# Patient Record
Sex: Female | Born: 1935
Health system: Southern US, Community
[De-identification: ages and names within clinical notes are randomized; demographics above are authoritative.]

## PROBLEM LIST (undated history)

## (undated) DIAGNOSIS — E785 Hyperlipidemia, unspecified: Secondary | ICD-10-CM

## (undated) DIAGNOSIS — M8580 Other specified disorders of bone density and structure, unspecified site: Secondary | ICD-10-CM

## (undated) DIAGNOSIS — G8929 Other chronic pain: Secondary | ICD-10-CM

## (undated) DIAGNOSIS — I639 Cerebral infarction, unspecified: Secondary | ICD-10-CM

## (undated) DIAGNOSIS — R55 Syncope and collapse: Secondary | ICD-10-CM

## (undated) DIAGNOSIS — F0150 Vascular dementia without behavioral disturbance: Secondary | ICD-10-CM

## (undated) DIAGNOSIS — I10 Essential (primary) hypertension: Secondary | ICD-10-CM

## (undated) DIAGNOSIS — K5790 Diverticulosis of intestine, part unspecified, without perforation or abscess without bleeding: Secondary | ICD-10-CM

## (undated) DIAGNOSIS — IMO0002 Reserved for concepts with insufficient information to code with codable children: Secondary | ICD-10-CM

## (undated) HISTORY — PX: DOPPLER ECHOCARDIOGRAPHY: SHX263

## (undated) HISTORY — DX: Cerebral infarction, unspecified: I63.9

## (undated) HISTORY — DX: Diverticulosis of intestine, part unspecified, without perforation or abscess without bleeding: K57.90

## (undated) HISTORY — DX: Essential (primary) hypertension: I10

## (undated) HISTORY — DX: Other specified disorders of bone density and structure, unspecified site: M85.80

## (undated) HISTORY — PX: ABDOMINAL HYSTERECTOMY: SHX81

## (undated) HISTORY — DX: Reserved for concepts with insufficient information to code with codable children: IMO0002

## (undated) HISTORY — PX: TONSILLECTOMY: SUR1361

## (undated) HISTORY — DX: Hyperlipidemia, unspecified: E78.5

## (undated) HISTORY — PX: OTHER SURGICAL HISTORY: SHX169

## (undated) HISTORY — PX: SPINE SURGERY: SHX786

## (undated) HISTORY — PX: EXPLORATORY LAPAROTOMY: SUR591

## (undated) HISTORY — DX: Syncope and collapse: R55

## (undated) HISTORY — DX: Other chronic pain: G89.29

---

## 1997-05-20 ENCOUNTER — Ambulatory Visit (HOSPITAL_COMMUNITY): Admission: RE | Admit: 1997-05-20 | Discharge: 1997-05-20 | Payer: Self-pay | Admitting: Family Medicine

## 1998-02-11 LAB — HM COLONOSCOPY: HM Colonoscopy: ABNORMAL

## 1998-05-09 ENCOUNTER — Other Ambulatory Visit: Admission: RE | Admit: 1998-05-09 | Discharge: 1998-05-09 | Payer: Self-pay | Admitting: Family Medicine

## 1998-06-21 ENCOUNTER — Ambulatory Visit (HOSPITAL_COMMUNITY): Admission: RE | Admit: 1998-06-21 | Discharge: 1998-06-21 | Payer: Self-pay | Admitting: Internal Medicine

## 1998-06-21 ENCOUNTER — Encounter: Payer: Self-pay | Admitting: Internal Medicine

## 1998-09-18 ENCOUNTER — Encounter (INDEPENDENT_AMBULATORY_CARE_PROVIDER_SITE_OTHER): Payer: Self-pay | Admitting: Specialist

## 1998-09-18 ENCOUNTER — Ambulatory Visit (HOSPITAL_COMMUNITY): Admission: RE | Admit: 1998-09-18 | Discharge: 1998-09-18 | Payer: Self-pay | Admitting: Obstetrics and Gynecology

## 1999-07-03 ENCOUNTER — Other Ambulatory Visit: Admission: RE | Admit: 1999-07-03 | Discharge: 1999-07-03 | Payer: Self-pay | Admitting: Family Medicine

## 2000-08-27 ENCOUNTER — Other Ambulatory Visit: Admission: RE | Admit: 2000-08-27 | Discharge: 2000-08-27 | Payer: Self-pay | Admitting: Family Medicine

## 2003-01-12 ENCOUNTER — Encounter (INDEPENDENT_AMBULATORY_CARE_PROVIDER_SITE_OTHER): Payer: Self-pay | Admitting: Internal Medicine

## 2003-01-25 ENCOUNTER — Other Ambulatory Visit: Admission: RE | Admit: 2003-01-25 | Discharge: 2003-01-25 | Payer: Self-pay | Admitting: Family Medicine

## 2003-02-02 ENCOUNTER — Encounter: Admission: RE | Admit: 2003-02-02 | Discharge: 2003-02-02 | Payer: Self-pay | Admitting: Family Medicine

## 2003-05-25 ENCOUNTER — Inpatient Hospital Stay (HOSPITAL_COMMUNITY): Admission: RE | Admit: 2003-05-25 | Discharge: 2003-05-29 | Payer: Self-pay | Admitting: Neurosurgery

## 2003-06-12 HISTORY — PX: LAMINECTOMY: SHX219

## 2004-09-17 ENCOUNTER — Ambulatory Visit: Payer: Self-pay | Admitting: Family Medicine

## 2004-12-12 ENCOUNTER — Ambulatory Visit: Payer: Self-pay | Admitting: Family Medicine

## 2005-03-06 ENCOUNTER — Ambulatory Visit: Payer: Self-pay | Admitting: Family Medicine

## 2005-11-11 ENCOUNTER — Ambulatory Visit: Payer: Self-pay | Admitting: Internal Medicine

## 2005-12-11 ENCOUNTER — Ambulatory Visit: Payer: Self-pay | Admitting: Family Medicine

## 2006-02-14 ENCOUNTER — Ambulatory Visit: Payer: Self-pay | Admitting: Family Medicine

## 2006-05-12 ENCOUNTER — Encounter (INDEPENDENT_AMBULATORY_CARE_PROVIDER_SITE_OTHER): Payer: Self-pay | Admitting: Internal Medicine

## 2006-05-12 DIAGNOSIS — I10 Essential (primary) hypertension: Secondary | ICD-10-CM | POA: Insufficient documentation

## 2006-05-12 DIAGNOSIS — E785 Hyperlipidemia, unspecified: Secondary | ICD-10-CM | POA: Insufficient documentation

## 2006-05-12 DIAGNOSIS — K5730 Diverticulosis of large intestine without perforation or abscess without bleeding: Secondary | ICD-10-CM | POA: Insufficient documentation

## 2006-05-12 DIAGNOSIS — M8580 Other specified disorders of bone density and structure, unspecified site: Secondary | ICD-10-CM | POA: Insufficient documentation

## 2006-09-18 ENCOUNTER — Telehealth: Payer: Self-pay | Admitting: Family Medicine

## 2006-12-16 ENCOUNTER — Ambulatory Visit: Payer: Self-pay | Admitting: Family Medicine

## 2006-12-16 DIAGNOSIS — M25559 Pain in unspecified hip: Secondary | ICD-10-CM | POA: Insufficient documentation

## 2007-01-02 ENCOUNTER — Encounter: Payer: Self-pay | Admitting: Family Medicine

## 2007-01-05 ENCOUNTER — Encounter: Admission: RE | Admit: 2007-01-05 | Discharge: 2007-01-05 | Payer: Self-pay | Admitting: Orthopaedic Surgery

## 2007-01-12 ENCOUNTER — Encounter (INDEPENDENT_AMBULATORY_CARE_PROVIDER_SITE_OTHER): Payer: Self-pay | Admitting: *Deleted

## 2007-02-03 ENCOUNTER — Telehealth: Payer: Self-pay | Admitting: Family Medicine

## 2007-02-24 ENCOUNTER — Encounter: Payer: Self-pay | Admitting: Family Medicine

## 2007-03-16 ENCOUNTER — Encounter: Payer: Self-pay | Admitting: Family Medicine

## 2007-03-25 ENCOUNTER — Encounter: Admission: RE | Admit: 2007-03-25 | Discharge: 2007-03-25 | Payer: Self-pay | Admitting: Orthopaedic Surgery

## 2007-04-10 ENCOUNTER — Inpatient Hospital Stay (HOSPITAL_COMMUNITY): Admission: RE | Admit: 2007-04-10 | Discharge: 2007-04-13 | Payer: Self-pay | Admitting: Orthopaedic Surgery

## 2007-05-08 ENCOUNTER — Encounter: Payer: Self-pay | Admitting: Family Medicine

## 2007-06-09 ENCOUNTER — Encounter: Payer: Self-pay | Admitting: Family Medicine

## 2007-08-18 ENCOUNTER — Encounter: Payer: Self-pay | Admitting: Family Medicine

## 2007-11-10 ENCOUNTER — Ambulatory Visit: Payer: Self-pay | Admitting: Family Medicine

## 2007-11-23 ENCOUNTER — Telehealth: Payer: Self-pay | Admitting: Family Medicine

## 2007-12-22 ENCOUNTER — Encounter: Payer: Self-pay | Admitting: Family Medicine

## 2007-12-23 ENCOUNTER — Encounter: Payer: Self-pay | Admitting: Family Medicine

## 2007-12-23 ENCOUNTER — Ambulatory Visit: Payer: Self-pay | Admitting: Family Medicine

## 2007-12-23 ENCOUNTER — Other Ambulatory Visit: Admission: RE | Admit: 2007-12-23 | Discharge: 2007-12-23 | Payer: Self-pay | Admitting: Family Medicine

## 2007-12-23 DIAGNOSIS — R109 Unspecified abdominal pain: Secondary | ICD-10-CM | POA: Insufficient documentation

## 2007-12-23 LAB — CONVERTED CEMR LAB
Ketones, urine, test strip: NEGATIVE
Nitrite: NEGATIVE
Protein, U semiquant: NEGATIVE
Urobilinogen, UA: 0.2

## 2007-12-23 LAB — HM PAP SMEAR

## 2007-12-24 ENCOUNTER — Encounter: Payer: Self-pay | Admitting: Family Medicine

## 2007-12-26 LAB — CONVERTED CEMR LAB
ALT: 29 units/L (ref 0–35)
Alkaline Phosphatase: 63 units/L (ref 39–117)
BUN: 17 mg/dL (ref 6–23)
Basophils Relative: 0.3 % (ref 0.0–3.0)
Bilirubin, Direct: 0.1 mg/dL (ref 0.0–0.3)
Creatinine, Ser: 0.9 mg/dL (ref 0.4–1.2)
Eosinophils Relative: 1.1 % (ref 0.0–5.0)
GFR calc Af Amer: 78 mL/min
HCT: 37.4 % (ref 36.0–46.0)
Hemoglobin: 12.9 g/dL (ref 12.0–15.0)
Monocytes Absolute: 0.7 10*3/uL (ref 0.1–1.0)
Monocytes Relative: 9.4 % (ref 3.0–12.0)
Phosphorus: 3.4 mg/dL (ref 2.3–4.6)
Platelets: 195 10*3/uL (ref 150–400)
Potassium: 4.1 meq/L (ref 3.5–5.1)
RBC: 3.76 M/uL — ABNORMAL LOW (ref 3.87–5.11)
Total CHOL/HDL Ratio: 3.7
Total Protein: 7.4 g/dL (ref 6.0–8.3)
Triglycerides: 143 mg/dL (ref 0–149)
WBC: 7.4 10*3/uL (ref 4.5–10.5)

## 2008-01-05 ENCOUNTER — Encounter: Payer: Self-pay | Admitting: Family Medicine

## 2008-02-08 ENCOUNTER — Ambulatory Visit: Payer: Self-pay | Admitting: Family Medicine

## 2008-02-08 DIAGNOSIS — R82998 Other abnormal findings in urine: Secondary | ICD-10-CM | POA: Insufficient documentation

## 2008-02-08 LAB — CONVERTED CEMR LAB
Bacteria, UA: 0
Epithelial cells, urine: 1 /lpf
Ketones, urine, test strip: NEGATIVE
Nitrite: NEGATIVE
RBC / HPF: 0
Urobilinogen, UA: 0.2

## 2008-02-13 ENCOUNTER — Ambulatory Visit: Payer: Self-pay | Admitting: Internal Medicine

## 2008-02-13 ENCOUNTER — Encounter: Payer: Self-pay | Admitting: Family Medicine

## 2008-02-13 ENCOUNTER — Inpatient Hospital Stay (HOSPITAL_COMMUNITY): Admission: EM | Admit: 2008-02-13 | Discharge: 2008-02-16 | Payer: Self-pay | Admitting: Emergency Medicine

## 2008-02-15 ENCOUNTER — Encounter: Payer: Self-pay | Admitting: Internal Medicine

## 2008-02-15 ENCOUNTER — Ambulatory Visit: Payer: Self-pay | Admitting: Vascular Surgery

## 2008-02-16 ENCOUNTER — Encounter: Payer: Self-pay | Admitting: Family Medicine

## 2008-02-16 ENCOUNTER — Ambulatory Visit: Payer: Self-pay | Admitting: Cardiology

## 2008-02-22 ENCOUNTER — Ambulatory Visit: Payer: Self-pay | Admitting: Family Medicine

## 2008-02-22 DIAGNOSIS — J069 Acute upper respiratory infection, unspecified: Secondary | ICD-10-CM | POA: Insufficient documentation

## 2008-02-26 ENCOUNTER — Ambulatory Visit: Payer: Self-pay | Admitting: Family Medicine

## 2008-02-26 DIAGNOSIS — I6350 Cerebral infarction due to unspecified occlusion or stenosis of unspecified cerebral artery: Secondary | ICD-10-CM | POA: Insufficient documentation

## 2008-02-27 ENCOUNTER — Encounter: Payer: Self-pay | Admitting: Family Medicine

## 2008-03-25 ENCOUNTER — Encounter: Payer: Self-pay | Admitting: Family Medicine

## 2008-09-14 ENCOUNTER — Telehealth: Payer: Self-pay | Admitting: Family Medicine

## 2008-11-30 ENCOUNTER — Ambulatory Visit: Payer: Self-pay | Admitting: Family Medicine

## 2009-01-18 ENCOUNTER — Encounter: Payer: Self-pay | Admitting: Family Medicine

## 2009-01-24 ENCOUNTER — Encounter (INDEPENDENT_AMBULATORY_CARE_PROVIDER_SITE_OTHER): Payer: Self-pay | Admitting: *Deleted

## 2009-11-07 ENCOUNTER — Ambulatory Visit: Payer: Self-pay | Admitting: Family Medicine

## 2009-11-09 LAB — CONVERTED CEMR LAB
AST: 23 units/L (ref 0–37)
Alkaline Phosphatase: 69 units/L (ref 39–117)
BUN: 18 mg/dL (ref 6–23)
Basophils Absolute: 0 10*3/uL (ref 0.0–0.1)
Bilirubin, Direct: 0 mg/dL (ref 0.0–0.3)
Chloride: 102 meq/L (ref 96–112)
GFR calc non Af Amer: 49.04 mL/min (ref >60)
Hemoglobin: 13 g/dL (ref 12.0–15.0)
Lymphocytes Relative: 38.9 % (ref 12.0–46.0)
Monocytes Relative: 9.8 % (ref 3.0–12.0)
Neutro Abs: 3.4 10*3/uL (ref 1.4–7.7)
Neutrophils Relative %: 48.6 % (ref 43.0–77.0)
Phosphorus: 3.2 mg/dL (ref 2.3–4.6)
Potassium: 4.2 meq/L (ref 3.5–5.1)
RBC: 3.91 M/uL (ref 3.87–5.11)
RDW: 13.9 % (ref 11.5–14.6)
TSH: 0.63 microintl units/mL (ref 0.35–5.50)
Total CHOL/HDL Ratio: 3
VLDL: 35.2 mg/dL (ref 0.0–40.0)

## 2010-01-26 ENCOUNTER — Encounter: Payer: Self-pay | Admitting: Family Medicine

## 2010-01-29 ENCOUNTER — Encounter: Payer: Self-pay | Admitting: Family Medicine

## 2010-01-29 LAB — HM MAMMOGRAPHY: HM Mammogram: NORMAL

## 2010-03-13 NOTE — Assessment & Plan Note (Signed)
Summary: FOR MED REFILL/RI   Vital Signs:  Patient profile:   77 year old female Weight:      168 pounds Temp:     98 degrees F oral Pulse rate:   92 / minute Pulse rhythm:   regular BP sitting:   112 / 80  (left arm) Cuff size:   regular  Vitals Entered By: Lewanda Rife LPN (November 07, 2009 3:10 PM) CC: med refill   History of Present Illness: here for f/u of lipid/ HTN / cva and bone loss  is not doing a lot  still a lot of chronic pain in her side   has "extra roll of fat" on L side  that side hurts a lot  has a bulging disk    wt is up 2 lb  has tried to take care of herself  diet is so so   bp is good 112/80-- good control of current pressure  due for lipids-- thisks it may be up due to diet  last LDL 130s  diet is not optimal- but she tries   on fosamax for osteopenia  ? last dexa 07-- pt declines it due to cost currently  is taking ca and vit D    flu shot - got that   Allergies: 1)  ! Celebrex (Celecoxib) 2)  Lodine  Past History:  Past Medical History: Last updated: 02/26/2008 Hyperlipidemia Hypertension Osteopenia Diverticulosis, colon deg disk dz past- chronic abd pain with neg expl lap  CVA  GI- [facility] cardiol- [facility] neurosx- Cyndia Skeeters   Past Surgical History: Last updated: 02/26/2008 Hysterectomy 1971, partial, abn pap neg exp lap in past for chronic R abd pain  Dexa 02/2001--osteopenia Dexa 02/2005--decreased BMD tonsilectomy 2000 colonosc- neg except for diverticulosis Exp. Lap--side pain,neg Laminectomy, L2 to L4  06/2003 L3- L4 microdiscectomy 2009 (1/10) hosp for L CVA (L post frontal and pariatal infarct) nl echo (1/10) with EF 60% nl carotid dopplers (1/10) MRA/MRI (1/10) with above cva plus 3 mm R ICA aneurysm  Family History: Last updated: 12/23/2007 father's side- HTN and back problems  mother- healthy  no cancer in family   Social History: Last updated: 12/23/2007 non smoker  retired    very rare alcohol   Risk Factors: Smoking Status: never (05/12/2006)  Review of Systems General:  Denies fatigue, fever, loss of appetite, and malaise. Eyes:  Denies blurring and eye irritation. CV:  Denies chest pain or discomfort, lightheadness, palpitations, and shortness of breath with exertion. Resp:  Denies cough and shortness of breath. GI:  Denies abdominal pain, bloody stools, change in bowel habits, indigestion, and nausea. GU:  Denies dysuria and urinary frequency. MS:  Complains of low back pain and mid back pain; denies joint pain, joint redness, and joint swelling. Derm:  Denies itching, lesion(s), poor wound healing, and rash. Neuro:  Denies numbness and tingling. Psych:  Denies anxiety and depression. Endo:  Denies excessive thirst and excessive urination. Heme:  Denies abnormal bruising and bleeding.  Physical Exam  General:  overweight but generally well appearing  Head:  normocephalic, atraumatic, and no abnormalities observed.  no sinus tenderness  Eyes:  vision grossly intact, pupils equal, pupils round, and pupils reactive to light.   Mouth:  pharynx pink and moist, no erythema, and no exudates.   Neck:  supple with full rom and no masses or thyromegally, no JVD or carotid bruit  Chest Wall:  No deformities, masses, or tenderness noted. Lungs:  Normal respiratory effort, chest  expands symmetrically. Lungs are clear to auscultation, no crackles or wheezes. Heart:  Normal rate and regular rhythm. S1 and S2 normal without gallop, murmur, click, rub or other extra sounds. Abdomen:  no suprapubic tenderness or fullness felt  no renal bruits  Msk:  some LS tenderness with pos slr on L  poor rom hips  Pulses:  R and L carotid,radial,femoral,dorsalis pedis and posterior tibial pulses are full and equal bilaterally Extremities:  No clubbing, cyanosis, edema, or deformity noted with normal full range of motion of all joints.   Neurologic:  sensation intact to light  touch and DTRs symmetrical and normal.   Skin:  Intact without suspicious lesions or rashes Cervical Nodes:  No lymphadenopathy noted Psych:  normal affect, talkative and pleasant    Impression & Recommendations:  Problem # 1:  CVA (ICD-434.91) Assessment Unchanged  no recurrance of symtpoms on asa  will continue to work on risk factors Her updated medication list for this problem includes:    Bayer Aspirin 325 Mg Tabs (Aspirin) .Marland Kitchen... Take one tablet once daily  Orders: Prescription Created Electronically [phone])  Problem # 2:  OSTEOPENIA (ICD-733.90) Assessment: Unchanged pt declines dexa this year due to cost  disc ca and D check D level continue fosamax Her updated medication list for this problem includes:    Fosamax 70 Mg Tabs (Alendronate sodium) .Marland Kitchen... Take one tablet by mouth once a  week as directed    Calcium Carbonate-vitamin D 600-400 Mg-unit Tabs (Calcium carbonate-vitamin d) .Marland Kitchen... Take 1 tablet by mouth once a day  Orders: Venipuncture (08657) TLB-Lipid Panel (80061-LIPID) TLB-Renal Function Panel (80069-RENAL) TLB-CBC Platelet - w/Differential (85025-CBCD) TLB-Hepatic/Liver Function Pnl (80076-HEPATIC) TLB-TSH (Thyroid Stimulating Hormone) (84443-TSH) T-Vitamin D (25-Hydroxy) (84696-29528) Specimen Handling (41324) Prescription Created Electronically [phone])  Problem # 3:  HIP PAIN (ICD-719.45) Assessment: Deteriorated  ongoing - switches sides with back pain and deg disc dz pt will f/u with ortho as this is affecting her more and more Her updated medication list for this problem includes:    Bayer Aspirin 325 Mg Tabs (Aspirin) .Marland Kitchen... Take one tablet once daily  Orders: Prescription Created Electronically [id number])  Problem # 4:  HYPERTENSION (ICD-401.9) Assessment: Unchanged  good control wtih current med no change in dose lab today Her updated medication list for this problem includes:    Triamterene-hctz 37.5-25 Mg Tabs (Triamterene-hctz) .Marland Kitchen...  1 by mouth once daily  Orders: Venipuncture (64403) TLB-Lipid Panel (80061-LIPID) TLB-Renal Function Panel (80069-RENAL) TLB-CBC Platelet - w/Differential (85025-CBCD) TLB-Hepatic/Liver Function Pnl (80076-HEPATIC) TLB-TSH (Thyroid Stimulating Hormone) (84443-TSH) T-Vitamin D (25-Hydroxy) (47425-95638) Prescription Created Electronically [phone])  BP today: 112/80 Prior BP: 110/80 (02/26/2008)  Labs Reviewed: K+: 4.1 (12/23/2007) Creat: : 0.9 (12/23/2007)   Chol: 240 (12/23/2007)   HDL: 65.6 (12/23/2007)   LDL: DEL (12/23/2007)   TG: 143 (12/23/2007)  Problem # 5:  HYPERLIPIDEMIA (ICD-272.4) Assessment: Unchanged  lipids today diet may not be opt disc low sat fat diet  Orders: Venipuncture (32951) TLB-Lipid Panel (80061-LIPID) TLB-Renal Function Panel (80069-RENAL) TLB-CBC Platelet - w/Differential (85025-CBCD) TLB-Hepatic/Liver Function Pnl (80076-HEPATIC) TLB-TSH (Thyroid Stimulating Hormone) (84443-TSH) T-Vitamin D (25-Hydroxy) (88416-60630) Prescription Created Electronically [phone])  Labs Reviewed: SGOT: 33 (12/23/2007)   SGPT: 29 (12/23/2007)   HDL:65.6 (12/23/2007)  LDL:DEL (12/23/2007)  Chol:240 (12/23/2007)  Trig:143 (12/23/2007)  Complete Medication List: 1)  Triamterene-hctz 37.5-25 Mg Tabs (Triamterene-hctz) .Marland Kitchen.. 1 by mouth once daily 2)  Fosamax 70 Mg Tabs (Alendronate sodium) .... Take one tablet by mouth once a  week as directed 3)  Bayer Aspirin 325 Mg Tabs (Aspirin) .... Take one tablet once daily 4)  Hydrocodone-homatropine 5-1.5 Mg/48ml Syrp (Hydrocodone-homatropine) .Marland Kitchen.. 1 tsp by mouth at bedtime as needed 5)  Fish Oil Oil (Fish oil) .... Take 1 capsule by mouth once a day 6)  Red Yeast Rice 600 Mg Caps (Red yeast rice extract) .... Take 1 capsule by mouth once a day 7)  Calcium Carbonate-vitamin D 600-400 Mg-unit Tabs (Calcium carbonate-vitamin d) .... Take 1 tablet by mouth once a day 8)  Vitamin B12 ?mg  .... Take 1 tablet by mouth once a  day  Other Orders: Flu Vaccine 6yrs + MEDICARE PATIENTS (A2130) Administration Flu vaccine - MCR (Q6578)  Patient Instructions: 1)  since you are having more back pain- I think you should follow up with your orthopedic doctor or neurosurgeon ( you can make your own appt) 2)  flu shot today  3)  lab today 4)  blood pressure is good  Prescriptions: FOSAMAX 70 MG  TABS (ALENDRONATE SODIUM) take one tablet by mouth once a  week as directed  #12 x 3   Entered and Authorized by:   Judith Part MD   Signed by:   Judith Part MD on 11/07/2009   Method used:   Electronically to        [facility]* (retail)       [address]       [place], [place]  [address]       Ph: [phone]       Fax: [phone]   RxID:   [id number] TRIAMTERENE-HCTZ 37.5-25 MG  TABS (TRIAMTERENE-HCTZ) 1 by mouth once daily  #90 x 3   Entered and Authorized by:   Judith Part MD   Signed by:   Judith Part MD on 11/07/2009   Method used:   Electronically to        [facility]* (retail)       [address]       [place], [place]  [address]       Ph: [phone]       Fax: [phone]   RxID:   [id number]   Current Allergies (reviewed today): ! CELEBREX (CELECOXIB) LODINE    Flu Vaccine Consent Questions     Do you have a history of severe allergic reactions to this vaccine? no    Any prior history of allergic reactions to egg and/or gelatin? no    Do you have a sensitivity to the preservative Thimersol? no    Do you have a past history of Guillan-Barre Syndrome? no    Do you currently have an acute febrile illness? no    Have you ever had a severe reaction to latex? no    Vaccine information given and explained to patient? yes    Are you currently pregnant? no    Lot Number:[id number]   Exp Date:08/11/2010   Site Given  Left Deltoid IMedflu  Lewanda Rife LPN  November 07, 2009 3:18 PM

## 2010-03-15 NOTE — Letter (Signed)
Summary: Results Follow up Letter  [place] [facility]  [address] [place], [place] [address]   Phone: [phone]  Fax: [phone]    01/29/2010 MRN: [id number]    [facility] [name] [address] [place], [place]  [address]    Dear Ms. Ortner,  The following are the results of your recent test(s):  Test         Result    Pap Smear:        Normal _____  Not Normal _____ Comments: ______________________________________________________ Cholesterol: LDL(Bad cholesterol):         Your goal is less than:         HDL (Good cholesterol):       Your goal is more than: Comments:  ______________________________________________________ Mammogram:        Normal ___X__  Not Normal _____ Comments:Repeat in one year.   ___________________________________________________________________ Hemoccult:        Normal _____  Not normal _______ Comments:    _____________________________________________________________________ Other Tests:    We routinely do not discuss normal results over the telephone.  If you desire a copy of the results, or you have any questions about this information we can discuss them at your next office visit.   Sincerely,    Idamae Schuller Tower,MD  MT/ri

## 2010-05-28 LAB — URINALYSIS, ROUTINE W REFLEX MICROSCOPIC
Bilirubin Urine: NEGATIVE
Glucose, UA: NEGATIVE mg/dL
Hgb urine dipstick: NEGATIVE
Ketones, ur: NEGATIVE mg/dL
Ketones, ur: NEGATIVE mg/dL
Nitrite: NEGATIVE
Nitrite: NEGATIVE
Protein, ur: NEGATIVE mg/dL
Specific Gravity, Urine: 1.008 (ref 1.005–1.030)
Specific Gravity, Urine: 1.023 (ref 1.005–1.030)
Urobilinogen, UA: 0.2 mg/dL (ref 0.0–1.0)
Urobilinogen, UA: 0.2 mg/dL (ref 0.0–1.0)
pH: 6.5 (ref 5.0–8.0)
pH: 6.5 (ref 5.0–8.0)

## 2010-05-28 LAB — CARDIAC PANEL(CRET KIN+CKTOT+MB+TROPI)
CK, MB: 1.6 ng/mL (ref 0.3–4.0)
Relative Index: INVALID (ref 0.0–2.5)
Total CK: 66 U/L (ref 7–177)
Total CK: 76 U/L (ref 7–177)
Troponin I: 0.01 ng/mL (ref 0.00–0.06)

## 2010-05-28 LAB — DIFFERENTIAL
Basophils Absolute: 0 10*3/uL (ref 0.0–0.1)
Basophils Absolute: 0 10*3/uL (ref 0.0–0.1)
Basophils Relative: 0 % (ref 0–1)
Basophils Relative: 0 % (ref 0–1)
Eosinophils Absolute: 0.1 10*3/uL (ref 0.0–0.7)
Eosinophils Relative: 1 % (ref 0–5)
Lymphocytes Relative: 25 % (ref 12–46)
Lymphs Abs: 2.4 K/uL (ref 0.7–4.0)
Monocytes Absolute: 0.9 10*3/uL (ref 0.1–1.0)
Monocytes Relative: 9 % (ref 3–12)
Neutro Abs: 4.8 10*3/uL (ref 1.7–7.7)
Neutro Abs: 6.1 K/uL (ref 1.7–7.7)
Neutrophils Relative %: 55 % (ref 43–77)
Neutrophils Relative %: 65 % (ref 43–77)

## 2010-05-28 LAB — LIPID PANEL
LDL Cholesterol: 137 mg/dL — ABNORMAL HIGH (ref 0–99)
Triglycerides: 124 mg/dL (ref <150)

## 2010-05-28 LAB — CBC
HCT: 36.8 % (ref 36.0–46.0)
Hemoglobin: 12.4 g/dL (ref 12.0–15.0)
Hemoglobin: 12.4 g/dL (ref 12.0–15.0)
MCHC: 33.8 g/dL (ref 30.0–36.0)
MCHC: 34 g/dL (ref 30.0–36.0)
MCV: 97 fL (ref 78.0–100.0)
MCV: 98.4 fL (ref 78.0–100.0)
Platelets: 243 10*3/uL (ref 150–400)
RBC: 3.73 MIL/uL — ABNORMAL LOW (ref 3.87–5.11)
RBC: 3.77 MIL/uL — ABNORMAL LOW (ref 3.87–5.11)
RDW: 13.1 % (ref 11.5–15.5)
RDW: 13.5 % (ref 11.5–15.5)
WBC: 9.4 K/uL (ref 4.0–10.5)

## 2010-05-28 LAB — URINE MICROSCOPIC-ADD ON

## 2010-05-28 LAB — URINE CULTURE: Culture: NO GROWTH

## 2010-05-28 LAB — COMPREHENSIVE METABOLIC PANEL
CO2: 29 mEq/L (ref 19–32)
Calcium: 10.4 mg/dL (ref 8.4–10.5)
Creatinine, Ser: 1.25 mg/dL — ABNORMAL HIGH (ref 0.4–1.2)
GFR calc non Af Amer: 42 mL/min — ABNORMAL LOW (ref >60)
Glucose, Bld: 93 mg/dL (ref 70–99)
Sodium: 141 mEq/L (ref 135–145)
Total Protein: 7.2 g/dL (ref 6.0–8.3)

## 2010-05-28 LAB — BASIC METABOLIC PANEL
BUN: 17 mg/dL (ref 6–23)
CO2: 27 mEq/L (ref 19–32)
Chloride: 104 mEq/L (ref 96–112)
Glucose, Bld: 101 mg/dL — ABNORMAL HIGH (ref 70–99)
Potassium: 3.8 mEq/L (ref 3.5–5.1)

## 2010-05-28 LAB — BASIC METABOLIC PANEL WITH GFR
Calcium: 10.4 mg/dL (ref 8.4–10.5)
Creatinine, Ser: 1.01 mg/dL (ref 0.4–1.2)
GFR calc Af Amer: >60 mL/min (ref >60)
GFR calc non Af Amer: 53 mL/min — ABNORMAL LOW (ref >60)
Sodium: 139 meq/L (ref 135–145)

## 2010-05-28 LAB — TROPONIN I: Troponin I: 0.01 ng/mL (ref 0.00–0.06)

## 2010-05-28 LAB — CK TOTAL AND CKMB (NOT AT ARMC)
CK, MB: 1.8 ng/mL (ref 0.3–4.0)
Relative Index: INVALID (ref 0.0–2.5)
Total CK: 69 U/L (ref 7–177)

## 2010-06-26 NOTE — H&P (Signed)
[name], [name]                ACCOUNT NO.:  [id number]   MEDICAL RECORD NO.:  [id number]          PATIENT TYPE:  EMS   LOCATION:  MAJO                         FACILITY:  [facility]   PHYSICIAN:  Gardiner Barefoot, MD    DATE OF BIRTH:  12/16/1930   DATE OF ADMISSION:  02/13/2008  DATE OF DISCHARGE:                              HISTORY & PHYSICAL   PRIMARY CARE PHYSICIAN:  Marne A. Tower, MD   CHIEF COMPLAINT:  Right hand dysfunction.   HISTORY OF PRESENT ILLNESS:  This is a 77 year old female with a history  of hypertension and hypercholesterolemia who noted an acute onset of  right hand weakness while vacuuming today.  She states that she could no  longer grasp the vacuum, and was unable to coil the wire around the  vacuum, hold the glass, or manipulate anything with her right hand.  She  states she has never had this before, and this had come on suddenly.  She reports it was sloppy feeling.  She denies any real known loss of  sensation, just an inability to move her right hand.  She denies any  headache.  No chest pain, shortness of breath, or any other associated  symptoms.   PAST MEDICAL HISTORY:  1. Hypertension.  2. Hyperlipidemia.  3. Chronic hip pain with disk narrowing.   MEDICATIONS:  1. Hydrochlorothiazide 12.5 mg daily.  2. Fish oil 1000 mg daily.  3. Calcium with vitamin D 500 mg b.i.d.   ALLERGIES:  MOBIC and CELEBREX.   SOCIAL HISTORY:  No history of alcohol, tobacco, or drugs.   FAMILY HISTORY:  Noncontributory.   REVIEW OF SYSTEMS:  Negative except as for the history of present  illness.   PHYSICAL EXAMINATION:  VITAL SIGNS:  Temperature 98.3, pulse 91,  respirations 18, blood pressure is 131/82, and O2 sats 97%.  GENERAL:  The patient is awake, alert, oriented x3, and appears in no  acute distress.  CARDIOVASCULAR:  Regular rate and rhythm with no murmurs, rubs, or  gallops.  LUNGS:  Clear to auscultation bilaterally.  ABDOMEN:  Soft, nontender, and  nondistended with positive bowel sounds.  No hepatosplenomegaly.  EXTREMITIES:  No cyanosis, clubbing, or edema.  NEUROLOGIC:  Intact cranial nerves, II through XII.  Her strength is  equal bilaterally, both upper and lower extremities including her right  hand, which are all 5/5.  She had normal sensation bilaterally on her  arms, hands, and face.   LABORATORY DATA:  A CT scan with no acute abnormality.  Sodium 139,  potassium 3.8, chloride 104, bicarb 27, glucose 101, BUN 17, creatinine  was 1.01, and calcium 10.4.  WBC 9.4, hemoglobin 12.4, platelets 243,  neutrophils 65%.  EKG with unchanged inverted T-waves, new first-degree  heart block.   ASSESSMENT AND PLAN:  Right hand dysfunction, which is transient and  resolved, concerning for a transient ischemic attack.  Also, new EKG  changes.  1. Transient ischemic attack.  We will have the patient evaluated for      stroke including MRI/MRA of the head, carotid Dopplers, and an  echocardiogram.  Will also hold her blood pressure medicine at this      time.  2. EKG changes.  Will send of serial enzymes to make sure this is not      a cardiac origin, though doubt this could be an atypical      presentation of myocardial infarction with no chest pain, shortness      of breath, or any other concerning symptoms.  3. Prophylaxis.  The patient will receive deep venous thrombosis      prophylaxis.      Gardiner Barefoot, MD  Electronically Signed     RWC/MEDQ  D:  02/13/2008  T:  02/14/2008  Job:  [id number]

## 2010-06-26 NOTE — Op Note (Signed)
[name], [name]                ACCOUNT NO.:  [id number]   MEDICAL RECORD NO.:  [id number]          PATIENT TYPE:  INP   LOCATION:  5018                         FACILITY:  [facility]   PHYSICIAN:  Mark C. Ophelia Charter, M.D.    DATE OF BIRTH:  30-Jun-1930   DATE OF PROCEDURE:  04/10/2007  DATE OF DISCHARGE:  04/13/2007                               OPERATIVE REPORT   PREOPERATIVE DIAGNOSIS:  Recurrent herniated nucleus pulposus, left L3-  L4.   POSTOPERATIVE DIAGNOSIS:  Recurrent herniated nucleus pulposus, left L3-  L4.   PROCEDURE:  1. Microdiskectomy, left L3-L4.  2. Repair of small dural tear with sutures and Tisseel.   SURGEON:  Mark C. Ophelia Charter, MD   ASSISTANT:  Wende Neighbors, PA-C   ANESTHESIA:  GOT.   FINDINGS:  Severe stenosis secondary to large fragmented HNP.   This 77 year old female has had previous decompression procedure from L2-  L3 down to L5 with an L2-L4 decompression laminectomy done by Dr. Phoebe Perch  back in 2005.  She did well until she had disk herniation at L3-L4 with  compression and radiculopathy failing conservative treatment.  She is  brought in for microdiskectomy due to the large disk herniation with  compression.  She had been treated conservatively with pain medications  and anti-inflammatories as well as epidural steroids without relief.   PROCEDURE IN DETAIL:  After induction of general anesthesia, the patient  was intubated and carefully positioned in prone position.  Back was  prepped with DuraPrep.  The area squared with towels.  Old incision was  noted and after Betadine-impregnated drape, preoperative Ancef, surgical  time-out procedure, cross-table lateral x-ray showed that the middle  marker was just at the inferior aspect of the L3 pedicle.  Incision was  made on the left side.  Subperiosteal dissection was performed.  There  was extensive scar tissue as expected following the lateral gutter.  Microdissection was used for release.  Small dural  tear occurred with  extremely thin dura.  Some sutures were placed and some Tisseel sealed  it and there was no leakage at 30 mm of pressure.  This was on the  lateral gutter.  On the left, once the nerve root was gently elevated,  large disk herniation was identified.  It was teased out, removed in  pieces, and this then allowed good decompression of the nerve root.  Passes were made through the disk and a hockey-stick was swept to enter  the dura in that area with compression.  This was a large fragment and  once decompression was performed, all bone had been removed at the level  of the pedicle.  Foramen and the nerve root  were freed.  Wound was irrigated.  Dural repair was checked again and  was watertight.  Fascia closed with 0 Vicryl, in the deep fascia 2-0  Vicryl, and then skin closure, postop dressing, extubation, and  transferred to recovery room where she was neurologically intact.      Mark C. Ophelia Charter, M.D.  Electronically Signed     MCY/MEDQ  D:  06/15/2007  T:  06/16/2007  Job:  [id number]

## 2010-06-26 NOTE — Discharge Summary (Signed)
[name], [name]                ACCOUNT NO.:  [id number]   MEDICAL RECORD NO.:  [id number]          PATIENT TYPE:  INP   LOCATION:  3010                         FACILITY:  [facility]   PHYSICIAN:  Willow Ora, MD           DATE OF BIRTH:  [birth year]   DATE OF ADMISSION:  02/13/2008  DATE OF DISCHARGE:  02/16/2008                               DISCHARGE SUMMARY   PRIMARY CARE PHYSICIAN:  Marne A. Tower, MD   DISCHARGE DIAGNOSES:  1. Acute left posterior frontal and parietal infarct.  2. Question of urinary tract infection with positive urinalysis on      admit.   HISTORY OF PRESENT ILLNESS:  [name] is a 77 year old white female  with past medical history of hypertension and hyperlipidemia, who  presented to [facility] Emergency Room on the day of admission with  reports of right hand weakness with sudden onset.  The patient reports  vacuuming on the day of admission at which time she experienced sudden  onset of significant weakness in right hand, unable to grasp vacuum,  hold glass, or manipulate right hand.  Upon arrival to the emergency  room, the patient's symptoms had resolved.  The patient denied any chest  pain, shortness of breath, or any other associated symptoms with  weakness.  However due to the patient's risk factors for CVA, the  patient was admitted for further evaluation and treatment.   PAST MEDICAL HISTORY:  1. Hypertension.  2. Hyperlipidemia.  3. Chronic hip pain with disk narrowing status post back surgery per      Dr. Annell Greening.   TESTS DURING THIS HOSPITALIZATION:  1. MRI/MRA of the brain on February 14, 2008, revealing acute left      posterior frontal and parietal infarct without hemorrhage.  MRA      revealing 3-mm outpouching medially from right ICA consistent with      small aneurysm.  2. A 2-D echocardiogram performed on February 15, 2008, revealing no      obvious cardiac source of embolus, left ventricular ejection      fraction 60% with no wall  motion abnormalities.  3. Bilateral carotid Doppler studies revealing no significant plaque      and no ICA stenosis bilaterally.   HOSPITAL COURSE:  1. Acute left posterior frontal and parietal infarct.  The patient was      not on any aspirin prior to this admission, therefore we will      discharge home on full-dose aspirin therapy.  We will hold      treatment with Plavix at this time and determine how the patient      tolerates aspirin therapy.  The patient was seen in consultation by      both Physical and Occupational Therapy during hospitalization who      recommended no followup.  The patient felt medically stable for      discharge home at this time.  Again, the patient's symptoms had      resolved upon arrival to the emergency room on the day of  admission.  2. Urinary tract infection with positive urinalysis on admit.  The      patient denies any dysuria.  Urinalysis revealed moderate      leukocytes with 7-10 WBCs.  At the time of dictation, urine culture      is pending.  Empiric antibiotic treatment has been held as the      patient is asymptomatic.  We will have primary care physician      follow up with urine culture in followup appointment as scheduled      next week.   DISCHARGE MEDICATIONS:  1. Aspirin 325 mg p.o. daily.  2. HCTZ 12.5 mg p.o. daily.  3. Fish oil 1000 mg p.o. daily.  4. Calcium plus D 500 mg p.o. b.i.d.   DISPOSITION:  The patient is felt medically stable for discharge home at  this time.  The patient is instructed to follow up with her primary care  physician, Dr. Roxy Manns on Friday, February 26, 2008, at 3:15 p.m. at  which time a urine culture can be followed up and any questions the  patient should have can be answered.      Cordelia Pen, NP      Willow Ora, MD  Electronically Signed    LE/MEDQ  D:  02/16/2008  T:  02/16/2008  Job:  [id number]   cc:   Marne A. Milinda Antis, MD

## 2010-06-29 NOTE — Discharge Summary (Signed)
Bishop, Olivia                ACCOUNT NO.:  [id number]   MEDICAL RECORD NO.:  [id number]          PATIENT TYPE:  INP   LOCATION:  5018                         FACILITY:  [facility]   PHYSICIAN:  Mark C. Ophelia Charter, M.D.    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   ADDITIONAL DIAGNOSES:  1. Osteoporosis.  2. Hypertension.   MEDICATIONS:  1. Fosamax.  2. Calcium.  3. Maxzide.   The patient was admitted for microdiskectomy at L3-L4 for a large  fragment which was causing pseudostenosis with compression, after having  a previous decompression from L2-L4 by Dr. Venetia Maxon, back in 2005.   ADMISSION LABORATORIES:  Chest x-ray showed no active cardiopulmonary  disease.  CBC was normal with hemoglobin of 13.2.  PT/PTT were normal.  Chemistry panel was normal with the exception of glucose of 102.   HOSPITAL COURSE:  The patient was admitted, underwent a recurrent  exploration for removal of a large fragment, small dural tear occurred,  which was repaired.  Tisseel was applied.  She had no problems with  headache.  Postoperatively, she was mobilized during the following  morning.  She has had some pain at the incision, improvement in her leg  pain, and just had a slight headache the morning after the surgery.  By  April 12, 2007, she had no headache.  She had good bowel sounds.  Good  relief from preop leg pain.  She was ambulatory and was discharged by  therapy.  Hemoglobin was 9.4 and stable.  Back incision was dry.  She  was given an oral prescriptions for pain medication.  Follow up in 1  week post discharge.  Continue the same preadmission medications she was  taking.   CONDITION ON DISCHARGE:  Improved.      Mark C. Ophelia Charter, M.D.  Electronically  Signed     MCY/MEDQ  D:  06/15/2007  T:  06/16/2007  Job:  [id number]

## 2010-06-29 NOTE — Discharge Summary (Signed)
[name], [name]                          ACCOUNT NO.:  [id number]   MEDICAL RECORD NO.:  [id number]                   PATIENT TYPE:  INP   LOCATION:  3014                                 FACILITY:  [facility]   PHYSICIAN:  Clydene Fake, M.D.               DATE OF BIRTH:  Feb 03, 1931   DATE OF ADMISSION:  05/25/2003  DATE OF DISCHARGE:  05/29/2003                                 DISCHARGE SUMMARY   DIAGNOSIS:  L2-3, 3-4, 4-5 stenosis, degenerative disk disease and  radiculopathy.   DISCHARGE DIAGNOSIS:  L2-3, 3-4, 4-5 stenosis, degenerative disk disease and  radiculopathy   PROCEDURE:  L2 through 4 decompressive laminectomy.   REASON FOR ADMISSION:  The patient is a 77 year old woman who has had  chronic intermittent back pain but since last summer, it has worsened and  she presented with leg pain and workup was done including MRI, showing  spinal stenosis at multilevels with degenerative changes.  The patient was  brought in for decompressive laminectomy.   HOSPITAL COURSE:  The patient was admitted to day surgery and underwent the  procedure above without complications.  Postop, she was transferred to the  recovery room and then to the floor.  She started getting up ambulating and  the following day, PT and OT were started for assisting with ambulation.  The patient continued making slow progress.  She did have some vomiting the  first couple of days but that slowly resolved.  She had a small bowel  movement by the 16th and her abdominal distention and discomfort were a  little bit less but still present and we watched her another day and she  continued to do well.  Abdomen was decompressed by the 17th and she was up  ambulating and doing well and she was discharged home in stable condition.   DISCHARGE MEDICATIONS:  Discharge medications same as pre-hospitalization.   DIET:  Diet as tolerated.   FOLLOWUP:  Follow up with Dr. Cristi Loron.                             Clydene Fake, M.D.    JRH/MEDQ  D:  06/23/2003  T:  06/24/2003  Job:  [id number]

## 2010-06-29 NOTE — Op Note (Signed)
[name], [name]                          ACCOUNT NO.:  [id number]   MEDICAL RECORD NO.:  [id number]                   PATIENT TYPE:  INP   LOCATION:  3014                                 FACILITY:  [facility]   PHYSICIAN:  Cristi Loron, M.D.            DATE OF BIRTH:  1930/08/18   DATE OF PROCEDURE:  05/26/2003  DATE OF DISCHARGE:                                 OPERATIVE REPORT   PREOPERATIVE DIAGNOSES:  L2-3, L3-4, L4-5 spinal stenosis, degenerative disk  disease, lumbago, lumbar radiculopathy.   POSTOPERATIVE DIAGNOSES:  L2-3, L3-4, L4-5 spinal stenosis, degenerative  disk disease, lumbago, lumbar radiculopathy.   PROCEDURE:  L3 and L4 bilateral laminectomy, foraminotomy, and bilateral L2  laminotomy, foraminotomy, for treatment of her spinal stenosis using  microdissection.   SURGEON:  Cristi Loron, M.D.   ASSISTANT:  Stefani Dama, M.D.   ANESTHESIA:  General endotracheal.   ESTIMATED BLOOD LOSS:  200 mL.   SPECIMENS:  None.   DRAINS:  None.   COMPLICATIONS:  None.   BRIEF HISTORY:  The patient is a 77 year old white female who suffers from  back and leg pain consistent with neurogenic claudication.  She failed  medical management and was worked up with a lumbar MRI, which demonstrated  spinal stenosis.  I discussed the various treatment options with her  including surgery.  The patient weighed the risks, benefits, and  alternatives of surgery and decided to proceed with a decompressive lumbar  laminectomy.   DESCRIPTION OF PROCEDURE:  The patient was brought to the operating room by  the anesthesia team.  General endotracheal anesthesia was induced.  She was  then turned to the prone position on the Road Runner frame.  The lumbosacral  region was then prepared with Betadine scrub and Betadine solution and  sterile drapes were applied.  I then injected the area to be incised with  Marcaine with epinephrine solution and used a scalpel to make a linear  midline incision over the L2-3, L3-4, and L4-5 interspaces.  I used  electrocautery to perform a bilateral subperiosteal dissection, stripping  the paraspinous musculature and laminae of L2, L3, L4, and L5.  I inserted  the McCullough retractor for exposure and obtained the intraoperative  radiograph to confirm our location.   We began by incising the interspinous ligament at L4-5, L3-4, and L2-3 with  the scalpel.  We then used a Leksell rongeur to remove the spinous process  of L3 and L4 and the caudal aspect of the L2 spinous process.   We then brought the operating microscope into the field and under its  magnification and illumination completed the microdissection/decompression.  We used a high-speed drill to perform bilateral L4, L3, and L2 laminotomies.  We then completed the laminectomy at L3 and L4 using the Kerrison punch and  then removed the ligamentum flavum at L4-5, L3-4, and L2-3.  We then used  microdissection to  free up the thecal sac and the nerve roots from the  epidural tissue and then we performed a generous foraminotomy about the  bilateral L3, L4, and L5 nerve roots.  We removed excess ligamentum flavum  from the lateral recesses, completing the decompression.  We did this  bilaterally.  We then inspected the L2-3, L3-4, and L4-5 intervertebral  disks.  There was some bulging but no significant herniations, and the nerve  roots and thecal sac were well-decompressed.  We then achieved stringent  hemostasis using bipolar electrocautery and then copiously irrigated the  wound out with bacitracin solution, removed the solution, and then removed  the McCullough retractor.  We reapproximated the patient's thoracolumbar  fascia with interrupted #1 Vicryl, the subcutaneous tissue with interrupted  2-0 Vicryl, and the skin with Steri-Strips and Benzoin.  The wound was then  coated with bacitracin ointment, a sterile dressing was applied, the drapes  were removed, and the  patient was subsequently returned to a supine  position, where she was extubated by the anesthesia team and transported to  the postanesthesia care unit in stable condition.  All sponge, instrument,  and needle counts were correct at the end of this case.                                               Cristi Loron, M.D.    JDJ/MEDQ  D:  05/26/2003  T:  05/27/2003  Job:  [id number]

## 2010-08-03 ENCOUNTER — Other Ambulatory Visit: Payer: Self-pay | Admitting: Orthopaedic Surgery

## 2010-08-03 DIAGNOSIS — M545 Low back pain, unspecified: Secondary | ICD-10-CM

## 2010-08-10 ENCOUNTER — Ambulatory Visit
Admission: RE | Admit: 2010-08-10 | Discharge: 2010-08-10 | Disposition: A | Discharge status: Home / Self Care | Payer: MEDICARE | Source: Ambulatory Visit | Attending: Orthopaedic Surgery | Admitting: Orthopaedic Surgery

## 2010-08-10 DIAGNOSIS — M545 Low back pain, unspecified: Secondary | ICD-10-CM

## 2010-08-10 MED ORDER — GADOBENATE DIMEGLUMINE 529 MG/ML IV SOLN
16.0000 mL | Freq: Once | INTRAVENOUS | Status: AC | PRN
  Administered 2010-08-10: 16 mL via INTRAVENOUS

## 2010-09-22 ENCOUNTER — Other Ambulatory Visit: Payer: Self-pay | Admitting: Family Medicine

## 2010-09-24 NOTE — Telephone Encounter (Signed)
[facility] electronically request Dyazide 37.5-25 mg ( Generic) #90 x0 with note pt needs to call for appt.

## 2010-11-02 LAB — DIFFERENTIAL
Basophils Relative: 0
Eosinophils Absolute: 0.1
Eosinophils Relative: 1
Neutrophils Relative %: 63

## 2010-11-02 LAB — COMPREHENSIVE METABOLIC PANEL
ALT: 35
AST: 31
Alkaline Phosphatase: 70
CO2: 29
Chloride: 105
GFR calc Af Amer: >60
GFR calc non Af Amer: 56 — ABNORMAL LOW
Glucose, Bld: 102 — ABNORMAL HIGH
Potassium: 3.7
Sodium: 140
Total Bilirubin: 0.5

## 2010-11-02 LAB — CBC
Hemoglobin: 13.2
MCHC: 34
RBC: 3.93
WBC: 7.7

## 2010-11-02 LAB — BASIC METABOLIC PANEL
BUN: 9
Chloride: 104
GFR calc non Af Amer: >60
Glucose, Bld: 112 — ABNORMAL HIGH
Potassium: 3.6
Sodium: 137

## 2010-11-02 LAB — URINALYSIS, ROUTINE W REFLEX MICROSCOPIC
Bilirubin Urine: NEGATIVE
Glucose, UA: NEGATIVE
Hgb urine dipstick: NEGATIVE
Protein, ur: NEGATIVE
Urobilinogen, UA: 0.2

## 2010-11-02 LAB — URINE MICROSCOPIC-ADD ON

## 2010-11-02 LAB — HEMOGLOBIN AND HEMATOCRIT, BLOOD: Hemoglobin: 9.4 — ABNORMAL LOW

## 2010-11-02 LAB — PROTIME-INR
INR: 0.9
Prothrombin Time: 12

## 2010-12-04 ENCOUNTER — Encounter: Payer: Self-pay | Admitting: Family Medicine

## 2010-12-04 ENCOUNTER — Ambulatory Visit (INDEPENDENT_AMBULATORY_CARE_PROVIDER_SITE_OTHER): Payer: Medicare Other | Admitting: Family Medicine

## 2010-12-04 VITALS — BP 140/90 | HR 82 | Temp 97.8°F | Ht 68.0 in | Wt 167.4 lb

## 2010-12-04 DIAGNOSIS — R55 Syncope and collapse: Secondary | ICD-10-CM

## 2010-12-04 DIAGNOSIS — K469 Unspecified abdominal hernia without obstruction or gangrene: Secondary | ICD-10-CM

## 2010-12-04 NOTE — Patient Instructions (Signed)
REFERRAL: GO THE THE FRONT ROOM AT THE ENTRANCE OF OUR CLINIC, NEAR CHECK IN. ASK FOR MARION. SHE WILL HELP YOU SET UP YOUR REFERRAL. DATE: TIME:  

## 2010-12-04 NOTE — Progress Notes (Signed)
  Subjective:    Patient ID: [name], female    DOB: Jan 05, 1931, 77 y.o.   MRN: [id number]  HPI  [name], a 77 y.o. female presents today in the office for the following:    Possible hernia, left abdominal wall. The patient is on any experiencing any pain on her abdominal wall, but she did notice a fullness that is present on her left anterior abdominal wall. This happens and will become more prominent when she is standing up.  Syncope: Saturday the patient passed out. Leaned over and got a little dizzy, Larey Seat down on the bed and got presyncopal, then fell over onto the bed and lost consciousness. Opened eyes and everything was black. When got up, had a little bit of a headache. Never anything similar. Except one time when she had some repetitive nausea, vomiting, diarrhea. She denied any palpitations. She denied any chest pain. On further review of systems, the patient does describe having some occasional shortness of breath exertionally, but this is not new and has been ongoing for some time.  Relevant risk factors include hypertension, hyperlipidemia, history of stroke.  The PMH, PSH, Social History, Family History, Medications, and allergies have been reviewed in [facility], and have been updated if relevant.   Review of Systems ROS: GEN: No acute illnesses, no fevers, chills. GI: No n/v/d, eating normally Pulm: above Interactive and getting along well at home.  Otherwise, ROS is as per the HPI.     Objective:   Physical Exam   Physical Exam  Blood pressure 140/90, pulse 82, temperature 97.8 F (36.6 C), temperature source Oral, height 5\' 8"  (1.727 m), weight 167 lb 6.4 oz (75.932 kg), SpO2 99.00%.  GEN: WDWN, NAD, Non-toxic, A & O x 3 HEENT: Atraumatic, Normocephalic. Neck supple. No masses, No LAD. Ears and Nose: No external deformity. CV: RRR, No M/G/R. No JVD. No thrill. No extra heart sounds. PULM: CTA B, no wheezes, crackles, rhonchi. No retractions. No resp.  distress. No accessory muscle use. ABD: S, NT, ND, +BS. No rebound tenderness. No HSM. I cannot really today locate a hernia in the patient's abdominal wall. Palpated in the region where she is most concerned it is noted in area protruding in the past EXTR: No c/c/e NEURO Normal gait.  PSYCH: Normally interactive. Conversant. Not depressed or anxious appearing.  Calm demeanor.        Assessment & Plan:   1. Syncope  Basic metabolic panel, CBC with Differential, Hepatic function panel, EKG 12-Lead, EKG 12-Lead, Ambulatory referral to Cardiology  2. Hernia, abdominal      I reassured her about what may be a small abdominal hernia, but I don't think this needs any sort of treatment or workup.  EKG: Normal sinus rhythm mostly, with about a 1.2 second pause with what appears to be PAC's, and some inverted T waves on non-contiguous leads.   Obtain laboratories above, she has not had laboratories in greater than a year, and the patient is on Dyazide and is almost 77 years old.  Hand with new onset syncope, and abnormal EKG, think that further workup for potential arrhythmia is reasonable. The patient also discusses that she has had some reasonable exertional shortness of breath. Think it is most conservative and prudent in this case to consult Cardiology, and I appreciate their assistance.

## 2010-12-05 ENCOUNTER — Encounter: Payer: Self-pay | Admitting: Cardiovascular Disease

## 2010-12-05 ENCOUNTER — Other Ambulatory Visit: Payer: Medicare Other

## 2010-12-05 ENCOUNTER — Ambulatory Visit (INDEPENDENT_AMBULATORY_CARE_PROVIDER_SITE_OTHER): Payer: Medicare Other | Admitting: Cardiovascular Disease

## 2010-12-05 VITALS — BP 141/91 | HR 98 | Ht 69.0 in | Wt 166.0 lb

## 2010-12-05 DIAGNOSIS — R55 Syncope and collapse: Secondary | ICD-10-CM

## 2010-12-05 LAB — HEPATIC FUNCTION PANEL
ALT: 15 U/L (ref 0–35)
AST: 20 U/L (ref 0–37)
Albumin: 3.9 g/dL (ref 3.5–5.2)
Total Bilirubin: 0.5 mg/dL (ref 0.3–1.2)

## 2010-12-05 LAB — CBC WITH DIFFERENTIAL/PLATELET
Basophils Absolute: 0 10*3/uL (ref 0.0–0.1)
HCT: 36.7 % (ref 36.0–46.0)
Hemoglobin: 12.6 g/dL (ref 12.0–15.0)
Lymphs Abs: 2.5 10*3/uL (ref 0.7–4.0)
MCV: 97.3 fl (ref 78.0–100.0)
Monocytes Absolute: 0.5 10*3/uL (ref 0.1–1.0)
Monocytes Relative: 8.2 % (ref 3.0–12.0)
Neutro Abs: 3 10*3/uL (ref 1.4–7.7)
Platelets: 237 10*3/uL (ref 150.0–400.0)
RDW: 13.5 % (ref 11.5–14.6)

## 2010-12-05 LAB — BASIC METABOLIC PANEL
BUN: 20 mg/dL (ref 6–23)
Calcium: 10.5 mg/dL (ref 8.4–10.5)
Creatinine, Ser: 1 mg/dL (ref 0.4–1.2)
GFR: 55.6 mL/min — ABNORMAL LOW (ref >60.00)
Glucose, Bld: 94 mg/dL (ref 70–99)

## 2010-12-05 NOTE — Progress Notes (Signed)
History of Present Illness:77 yo female with history of HTN, HLD, TIA 2010 who is here today to establish cardiology care. She was seen by Dr. Karleen Hampshire Copland in [facility] yesterday. She gives a history of syncope several days ago. She was standing by her bed and felt dizzy when she leaned over, she passed out. She reports a loss of consciousness. She fell onto the bed. No trauma. No clear history of palpitations. No chest pain. Baseline SOB but no real changes. EKG yesterday with NSR, premature atrial beat with post PAC pause. She does not give a history of prior pre-syncope or syncope. She has no known cardiac issues. She did have some sinus issues last week with cough. Her po intake has been adequate. She has had normal carotid artery dopplers in the past.   Past Medical History  Diagnosis Date  . Hypertension   . Hyperlipidemia   . Osteopenia   . Diverticulosis   . DDD (degenerative disc disease)   . Chronic pain   . Stroke   . Syncope     Past Surgical History  Procedure Date  . Abdominal hysterectomy   . Tonsillectomy   . Exploratory laparotomy     2 times both neg   . Spine surgery     l3-l4 microdiscectomy   . Laminectomy 06-2003    l2-l4  . Doppler echocardiography     normal  . Carotid doppler     normal    Current Outpatient Prescriptions  Medication Sig Dispense Refill  . aspirin 325 MG tablet Take 325 mg by mouth daily.        . Calcium Carbonate-Vit D-Min 600-400 MG-UNIT TABS Take 1 tablet by mouth daily.        . Ergocalciferol (VITAMIN D2) 2000 UNITS TABS Take 1 tablet by mouth daily.        . fish oil-omega-3 fatty acids 1000 MG capsule Take 1 g by mouth daily.        . traMADol (ULTRAM) 50 MG tablet As needed      . triamterene-hydrochlorothiazide (DYAZIDE) 37.5-25 MG per capsule TAKE 1 CAPSULE ONCE DAILY  90 capsule  0  . vitamin B-12 (CYANOCOBALAMIN) 1000 MCG tablet Take 1,000 mcg by mouth daily.          Allergies  Allergen Reactions  .  Celecoxib     REACTION: affects kidneys  . Etodolac     REACTION: Rash    History   Social History  . Marital Status: Single    Spouse Name: N/A    Number of Children: N/A  . Years of Education: N/A   Occupational History  . retired    Social History Main Topics  . Smoking status: Never Smoker   . Smokeless tobacco: Not on file  . Alcohol Use: Yes  . Drug Use: No  . Sexually Active: Not on file   Other Topics Concern  . Not on file   Social History Narrative  . No narrative on file    Family History  Problem Relation Age of Onset  . Hypertension Father     Review of Systems:  As stated in the HPI and otherwise negative.   BP 150/96  Pulse 100  Ht 5\' 8"  (1.727 m)  Wt 166 lb (75.297 kg)  BMI 25.24 kg/m2  Physical Examination: General: Well developed, well nourished, NAD HEENT: OP clear, mucus membranes moist SKIN: warm, dry. No rashes. Neuro: No focal deficits Musculoskeletal: Muscle strength 5/5 all  ext Psychiatric: Mood and affect normal Neck: No JVD, no carotid bruits, no thyromegaly, no lymphadenopathy. Lungs:Clear bilaterally, no wheezes, rhonci, crackles Cardiovascular: Regular rate and rhythm. No murmurs, gallops or rubs. Abdomen:Soft. Bowel sounds present. Non-tender.  Extremities: No lower extremity edema. Pulses are 2 + in the bilateral DP/PT.  EKG: (12/04/10): NSR, rate 84 bpm. PACs. Post PAC pause.

## 2010-12-05 NOTE — Patient Instructions (Signed)
Your physician recommends that you schedule a follow-up appointment in: 4 weeks.   Your physician has requested that you have an echocardiogram. Echocardiography is a painless test that uses sound waves to create images of your heart. It provides your doctor with information about the size and shape of your heart and how well your heart's chambers and valves are working. This procedure takes approximately one hour. There are no restrictions for this procedure.   Your physician has recommended that you wear an event monitor. Event monitors are medical devices that record the heart's electrical activity. Doctors most often [place] these monitors to diagnose arrhythmias. Arrhythmias are problems with the speed or rhythm of the heartbeat. The monitor is a small, portable device. You can wear one while you do your normal daily activities. This is usually used to diagnose what is causing palpitations/syncope (passing out).

## 2010-12-05 NOTE — Assessment & Plan Note (Signed)
She is mildly orthostatic today. I will encourage adequate po intake. She should be drinking 8 glasses of water per day. I will get an echo to assess LV function and exclude structural heart disease. I will also arrange a 21 day event monitor to exclude any significant arrhythmias. She is known to have PACs with a short pause post PAC. I will see her back in 3-4 weeks to review.

## 2010-12-12 ENCOUNTER — Encounter (INDEPENDENT_AMBULATORY_CARE_PROVIDER_SITE_OTHER): Payer: Medicare Other

## 2010-12-12 ENCOUNTER — Ambulatory Visit (HOSPITAL_COMMUNITY): Payer: Medicare Other | Attending: Cardiovascular Disease | Admitting: Radiology

## 2010-12-12 DIAGNOSIS — Z8673 Personal history of transient ischemic attack (TIA), and cerebral infarction without residual deficits: Secondary | ICD-10-CM | POA: Insufficient documentation

## 2010-12-12 DIAGNOSIS — R55 Syncope and collapse: Secondary | ICD-10-CM

## 2010-12-12 DIAGNOSIS — I10 Essential (primary) hypertension: Secondary | ICD-10-CM | POA: Insufficient documentation

## 2010-12-12 DIAGNOSIS — E785 Hyperlipidemia, unspecified: Secondary | ICD-10-CM | POA: Insufficient documentation

## 2011-01-08 ENCOUNTER — Ambulatory Visit (INDEPENDENT_AMBULATORY_CARE_PROVIDER_SITE_OTHER): Payer: Medicare Other | Admitting: Cardiovascular Disease

## 2011-01-08 ENCOUNTER — Encounter: Payer: Self-pay | Admitting: Cardiovascular Disease

## 2011-01-08 VITALS — BP 160/100 | HR 93 | Ht 68.0 in | Wt 165.0 lb

## 2011-01-08 DIAGNOSIS — R55 Syncope and collapse: Secondary | ICD-10-CM

## 2011-01-08 DIAGNOSIS — I10 Essential (primary) hypertension: Secondary | ICD-10-CM

## 2011-01-08 NOTE — Progress Notes (Signed)
History of Present Illness: 77 yo female with history of HTN, HLD, TIA 2010 who is here today for cardiac follow up. I saw her as a new patient 12/05/10 for evaluation of syncope.  She was seen by Dr. Karleen Hampshire Copland in [facility] prior to her initial visit here and gave a history of syncope.Marland Kitchen She was standing by her bed and felt dizzy when she leaned over, she passed out. She reported a loss of consciousness. She fell onto the bed. No trauma. No clear history of palpitations. No chest pain. Baseline SOB but no real changes. EKG in October 2012 in primary care was reviewed and showed  NSR, premature atrial beat with post PAC pause. She did not give a history of prior pre-syncope or syncope. She has no known cardiac issues. She reported good po intake prior to her episode.  She has had normal carotid artery dopplers in the past.  I ordered an 21 day event monitor and an echo. Her echo showed mild LVH, normal LV systolic function, minor valvular disease and a non-significant wery small pericardial  effusion.  Her event monitor showed sinus rhythm with periods of sinus bradycardia with heart rate as low as 50 bpm. There were frequent PVCs but no evidence of atrial fibrillation, VT or SVT. She has been feeling well. She denies any recurrent episodes of syncope. She is active. She has been pushing po intake.    Past Medical History  Diagnosis Date  . Hypertension   . Hyperlipidemia   . Osteopenia   . Diverticulosis   . DDD (degenerative disc disease)   . Chronic pain   . Stroke   . Syncope     Past Surgical History  Procedure Date  . Abdominal hysterectomy   . Tonsillectomy   . Exploratory laparotomy     2 times both neg   . Spine surgery     l3-l4 microdiscectomy   . Laminectomy 06-2003    l2-l4  . Doppler echocardiography     normal  . Carotid doppler     normal    Current Outpatient Prescriptions  Medication Sig Dispense Refill  . aspirin 325 MG tablet Take 325 mg by mouth  daily.        . Calcium Carbonate-Vit D-Min 600-400 MG-UNIT TABS Take 1 tablet by mouth daily.        . Ergocalciferol (VITAMIN D2) 2000 UNITS TABS Take 1 tablet by mouth daily.        . fish oil-omega-3 fatty acids 1000 MG capsule Take 1 g by mouth daily.        . traMADol (ULTRAM) 50 MG tablet As needed      . triamterene-hydrochlorothiazide (DYAZIDE) 37.5-25 MG per capsule TAKE 1 CAPSULE ONCE DAILY  90 capsule  0  . vitamin B-12 (CYANOCOBALAMIN) 1000 MCG tablet Take 1,000 mcg by mouth daily.          Allergies  Allergen Reactions  . Celecoxib     REACTION: affects kidneys  . Etodolac     REACTION: Rash    History   Social History  . Marital Status: Single    Spouse Name: N/A    Number of Children: N/A  . Years of Education: N/A   Occupational History  . retired    Social History Main Topics  . Smoking status: Never Smoker   . Smokeless tobacco: Not on file  . Alcohol Use: Yes  . Drug Use: No  . Sexually Active: Not on  file   Other Topics Concern  . Not on file   Social History Narrative  . No narrative on file    Family History  Problem Relation Age of Onset  . Hypertension Father     Review of Systems:  As stated in the HPI and otherwise negative.   BP 160/100  Pulse 93  Ht 5\' 8"  (1.727 m)  Wt 165 lb (74.844 kg)  BMI 25.09 kg/m2  Physical Examination: General: Well developed, well nourished, NAD HEENT: OP clear, mucus membranes moist SKIN: warm, dry. No rashes. Neuro: No focal deficits Musculoskeletal: Muscle strength 5/5 all ext Psychiatric: Mood and affect normal Neck: No JVD, no carotid bruits, no thyromegaly, no lymphadenopathy. Lungs:Clear bilaterally, no wheezes, rhonci, crackles Cardiovascular: Regular rate and rhythm. No murmurs, gallops or rubs. Abdomen:Soft. Bowel sounds present. Non-tender.  Extremities: No lower extremity edema. Pulses are 2 + in the bilateral DP/PT.  Echo: 12/12/10:  Left ventricle: The cavity size was normal.  There was mild to moderate concentric hypertrophy with septal prominence. Systolic function was normal. The estimated ejection fraction was in the range of 60% to 65%. Wall motion was normal; there were no regional wall motion abnormalities. Doppler parameters are consistent with abnormal left ventricular relaxation (grade 1 diastolic dysfunction). Doppler parameters are consistent with high ventricular filling pressure. - Aortic valve: Mild regurgitation. - Mitral valve: Mild regurgitation. - Right ventricle: The cavity size was normal. Wall thickness was mildly increased. - Pericardium, extracardiac: A trivial pericardial effusion was identified posterior to the heart.  Event monitor November 2012:   Sinus rhythm with periods of sinus bradycardia with heart rate as low as 50 bpm. There were frequent PVCs but no evidence of atrial fibrillation, VT or SVT.

## 2011-01-08 NOTE — Assessment & Plan Note (Signed)
No structural heart disease. No malignant arrythmias on event monitor. I think the most likely cause of her syncope was related to dehydration with possible underlying sinus bradycardia. No events while wearing monitor. She has felt better since increasing daily intake of water. She is not on a rate lowering agent. I will see her back in one year.

## 2011-01-08 NOTE — Assessment & Plan Note (Signed)
BP elevated today. She has been racing around this am. She does not wish to change her therapy. She is planning to buy a BP cuff and have at home. She will alert primary care if BP remains elevated.

## 2011-01-08 NOTE — Patient Instructions (Signed)
Your physician wants you to follow-up in: 12 months. You will receive a reminder letter in the mail two months in advance. If you don't receive a letter, please call our office to schedule the follow-up appointment.  Check blood pressure at home

## 2011-01-21 ENCOUNTER — Ambulatory Visit (INDEPENDENT_AMBULATORY_CARE_PROVIDER_SITE_OTHER): Payer: Medicare Other | Admitting: Family Medicine

## 2011-01-21 ENCOUNTER — Encounter: Payer: Self-pay | Admitting: Family Medicine

## 2011-01-21 VITALS — BP 124/80 | HR 76 | Temp 98.2°F | Wt 166.0 lb

## 2011-01-21 DIAGNOSIS — R1031 Right lower quadrant pain: Secondary | ICD-10-CM

## 2011-01-21 LAB — POCT URINALYSIS DIPSTICK
Nitrite, UA: NEGATIVE
pH, UA: 7

## 2011-01-21 LAB — COMPREHENSIVE METABOLIC PANEL
ALT: 15 U/L (ref 0–35)
Albumin: 3.8 g/dL (ref 3.5–5.2)
Alkaline Phosphatase: 75 U/L (ref 39–117)
Glucose, Bld: 107 mg/dL — ABNORMAL HIGH (ref 70–99)
Potassium: 4.2 mEq/L (ref 3.5–5.1)
Sodium: 137 mEq/L (ref 135–145)
Total Protein: 7.4 g/dL (ref 6.0–8.3)

## 2011-01-21 LAB — CBC WITH DIFFERENTIAL/PLATELET
Basophils Absolute: 0 10*3/uL (ref 0.0–0.1)
Eosinophils Absolute: 0.2 10*3/uL (ref 0.0–0.7)
Eosinophils Relative: 2.2 % (ref 0.0–5.0)
MCHC: 34.1 g/dL (ref 30.0–36.0)
MCV: 97.7 fl (ref 78.0–100.0)
Monocytes Absolute: 0.6 10*3/uL (ref 0.1–1.0)
Neutrophils Relative %: 59.5 % (ref 43.0–77.0)
Platelets: 223 10*3/uL (ref 150.0–400.0)
RDW: 13.5 % (ref 11.5–14.6)
WBC: 6.9 10*3/uL (ref 4.5–10.5)

## 2011-01-21 NOTE — Assessment & Plan Note (Addendum)
No acute abdomen, not acutely ill. Very positional pain. Given story, anticipate pulled muscle. Check stat CBC, CMP, UA to eval other intra-abd etiologies, if elevated WBC, set up CT scan. Advised if worsening abd pain or any fevers, chills, nausea, vomiting, to seek care at ER.  Discussed possibility of appendicitis although doubtful given presentation. No hernias noted today. UA - overall normal.  anticipate some contaminated, sent culture.

## 2011-01-21 NOTE — Patient Instructions (Addendum)
I'm not sure where this pain is coming from. It sounds more like an abdominal wall muscle strain. Treat with rest and tylenol as needed.  May also use tramadol for breakthrough pain. Update [place] if fever >101, or worsening pain. We will check fast blood work (stat) to evaluate other causes of abdominal pain. If any worsening pain, fevers/chills, or nausea/vomiting, please seek care at ER as this is where your appendix is, however I don't think it is acting up currently.

## 2011-01-21 NOTE — Progress Notes (Signed)
  Subjective:    Patient ID: [name], female    DOB: 28-Dec-1930, 77 y.o.   MRN: [id number]  HPI CC: R abd pain  Having 1d h/o R LQ pain worse when bending over.  Described as awful shooting pain, radiating to lower back, not to chest or groin.  Denies inciting trauma.  However did start when bending over.  Did more walking than usual Saturday at [place].  Years ago had similar pain, found to have kidney infection.  Denies dysuria, urgency, frequency, hematuria.  No fevers/chills, n/v/d/c.  No blood in stool.  Appetite ok.  Seen by back doctor Dr. Ophelia Charter and started on tramadol for back pain, told has osteoarthritis and scoliosis  Has had back surgeries x2, hysterectomy 1976.    Past Surgical History  Procedure Date  . Abdominal hysterectomy   . Tonsillectomy   . Exploratory laparotomy     2 times both neg   . Spine surgery     l3-l4 microdiscectomy   . Laminectomy 06-2003    l2-l4  . Doppler echocardiography     normal  . Carotid doppler     normal   Lab Results  Component Value Date   WBC 6.2 12/05/2010   HGB 12.6 12/05/2010   HCT 36.7 12/05/2010   MCV 97.3 12/05/2010   PLT 237.0 12/05/2010   Lab Results  Component Value Date   CREATININE 1.0 12/05/2010   Review of Systems Per HPI    Objective:   Physical Exam  Nursing note and vitals reviewed. Constitutional: She appears well-developed and well-nourished. No distress.  HENT:  Head: Normocephalic and atraumatic.  Cardiovascular: Normal rate, regular rhythm, normal heart sounds and intact distal pulses.   No murmur heard. Pulmonary/Chest: Effort normal and breath sounds normal. No respiratory distress. She has no wheezes. She has no rales.  Abdominal: Soft. Bowel sounds are normal. She exhibits no distension. There is no hepatosplenomegaly. There is tenderness (mild to deep palpation) in the right lower quadrant. There is no rigidity, no rebound, no guarding, no CVA tenderness and negative Murphy's sign. No  hernia. Hernia confirmed negative in the right inguinal area and confirmed negative in the left inguinal area.       Neg psoas sign. Worse pain with laying supine from sitting position and sitting back up.  Musculoskeletal: She exhibits no edema.  Skin: Skin is warm and dry. No rash noted.  Psychiatric: She has a normal mood and affect.      Assessment & Plan:

## 2011-01-22 ENCOUNTER — Other Ambulatory Visit: Payer: Self-pay | Admitting: *Deleted

## 2011-01-22 MED ORDER — TRIAMTERENE-HCTZ 37.5-25 MG PO CAPS: 1.0000 | ORAL_CAPSULE | Freq: Every day | ORAL | Status: DC

## 2011-01-22 NOTE — Telephone Encounter (Signed)
Request 90 day rx for [facility]

## 2011-01-22 NOTE — Telephone Encounter (Signed)
Will refill electronically  

## 2011-01-23 LAB — URINE CULTURE
Colony Count: NO GROWTH
Organism ID, Bacteria: NO GROWTH

## 2011-01-28 ENCOUNTER — Encounter: Payer: Self-pay | Admitting: Family Medicine

## 2011-05-21 ENCOUNTER — Ambulatory Visit (INDEPENDENT_AMBULATORY_CARE_PROVIDER_SITE_OTHER): Payer: Medicare Other | Admitting: Family Medicine

## 2011-05-21 ENCOUNTER — Encounter: Payer: Self-pay | Admitting: Family Medicine

## 2011-05-21 VITALS — BP 130/80 | HR 73 | Temp 97.9°F | Ht 68.0 in | Wt 163.8 lb

## 2011-05-21 DIAGNOSIS — I10 Essential (primary) hypertension: Secondary | ICD-10-CM

## 2011-05-21 DIAGNOSIS — E785 Hyperlipidemia, unspecified: Secondary | ICD-10-CM

## 2011-05-21 DIAGNOSIS — M899 Disorder of bone, unspecified: Secondary | ICD-10-CM

## 2011-05-21 DIAGNOSIS — M949 Disorder of cartilage, unspecified: Secondary | ICD-10-CM

## 2011-05-21 LAB — COMPREHENSIVE METABOLIC PANEL
ALT: 18 U/L (ref 0–35)
AST: 22 U/L (ref 0–37)
Albumin: 4 g/dL (ref 3.5–5.2)
Alkaline Phosphatase: 69 U/L (ref 39–117)
Calcium: 10.5 mg/dL (ref 8.4–10.5)
Chloride: 102 mEq/L (ref 96–112)
Creatinine, Ser: 1.5 mg/dL — ABNORMAL HIGH (ref 0.4–1.2)
Potassium: 4.2 mEq/L (ref 3.5–5.1)

## 2011-05-21 LAB — CBC WITH DIFFERENTIAL/PLATELET
Basophils Absolute: 0 10*3/uL (ref 0.0–0.1)
Eosinophils Absolute: 0.4 10*3/uL (ref 0.0–0.7)
Lymphocytes Relative: 36.4 % (ref 12.0–46.0)
MCHC: 33.8 g/dL (ref 30.0–36.0)
Monocytes Absolute: 0.6 10*3/uL (ref 0.1–1.0)
Neutrophils Relative %: 48.5 % (ref 43.0–77.0)
RDW: 13.5 % (ref 11.5–14.6)

## 2011-05-21 LAB — LIPID PANEL
HDL: 76 mg/dL (ref >39.00)
VLDL: 23.2 mg/dL (ref 0.0–40.0)

## 2011-05-21 LAB — LDL CHOLESTEROL, DIRECT: Direct LDL: 122.1 mg/dL

## 2011-05-21 MED ORDER — TRIAMTERENE-HCTZ 37.5-25 MG PO CAPS: 1.0000 | ORAL_CAPSULE | Freq: Every day | ORAL | Status: DC

## 2011-05-21 NOTE — Assessment & Plan Note (Signed)
bp in fair control at this time  No changes needed  Disc lifstyle change with low sodium diet and exercise   Will refil med through [facility] order Lab today

## 2011-05-21 NOTE — Patient Instructions (Signed)
We will schedule bone density test at check out  Labs today  Medicine refils today  Try to stay active -- keep moving / eat a healthy diet  Consider water exercise-it is great for back pain and joint problems  Follow up in about 6 months

## 2011-05-21 NOTE — Progress Notes (Signed)
Subjective:    Patient ID: [name], female    DOB: May 16, 1930, 78 y.o.   MRN: [id number]  HPI Here for f/u of chronic conditions  Is doing well overall - but more aches and pains - getting by with that  But she keeps on going   Wt is down 3 lb with bmi of 24 Is eating healthier lately - and less appetite than she used to have   bp is   130/80  Today No cp or palpitations or headaches or edema  No side effects to medicines   Takes dyazide Last labs with uti cr did bump- needs that re checked  She had to wear a heart monitor for a while - said turned out ok   Cholesterol-due for check Diet controlled Lab Results  Component Value Date   CHOL 230* 11/07/2009   HDL 76.10 11/07/2009   LDLCALC  Value: 137        Total Cholesterol/HDL:CHD Risk Coronary Heart Disease Risk Table                     Men   Women  1/2 Average Risk   3.4   3.3  Average Risk       5.0   4.4  2 X Average Risk   9.6   7.1  3 X Average Risk  23.4   11.0        Use the calculated Patient Ratio above and the CHD Risk Table to determine the patient's CHD Risk.        ATP III CLASSIFICATION (LDL):  <100     mg/dL   Optimal  562-130  mg/dL   Near or Above                    Optimal  130-159  mg/dL   Borderline  865-784  mg/dL   High  >696     mg/dL   Very High* 03/22/5282   LDLDIRECT 132.0 11/07/2009   TRIG 176.0* 11/07/2009   CHOLHDL 3 11/07/2009   will check that today  Diet is fair -does occ eat red meat/ fried foods- occ bacon and sausage   Hx of osteopenia Takes vit D and calcium Is overdue for a bone density  Last was done with dr Karie Chimera is up to date   Has chronic back pain  Takes tramadol from Dr Ophelia Charter  Wants to have me take that over -- but needs to talk to him first   Patient Active Problem List  Diagnoses  . HYPERLIPIDEMIA  . HYPERTENSION  . CVA  . DIVERTICULOSIS, COLON  . HIP PAIN  . OSTEOPENIA  . PELVIC  PAIN  . URINALYSIS, ABNORMAL  . Syncope  . RLQ abdominal pain   Past  Medical History  Diagnosis Date  . Hypertension   . Hyperlipidemia   . Osteopenia   . Diverticulosis   . DDD (degenerative disc disease)   . Chronic pain   . Stroke   . Syncope    Past Surgical History  Procedure Date  . Abdominal hysterectomy   . Tonsillectomy   . Exploratory laparotomy     2 times both neg   . Spine surgery     l3-l4 microdiscectomy   . Laminectomy 06-2003    l2-l4  . Doppler echocardiography     normal  . Carotid doppler     normal   History  Substance Use Topics  .  Smoking status: Never Smoker   . Smokeless tobacco: Never Used  . Alcohol Use: No   Family History  Problem Relation Age of Onset  . Hypertension Father    Allergies  Allergen Reactions  . Celecoxib     REACTION: affects kidneys  . Codeine Nausea Only    And dizziness  . Etodolac     REACTION: Rash   Current Outpatient Prescriptions on File Prior to Visit  Medication Sig Dispense Refill  . aspirin 325 MG tablet Take 325 mg by mouth daily.        . Calcium Carbonate-Vit D-Min 600-400 MG-UNIT TABS Take 1 tablet by mouth daily.        . Ergocalciferol (VITAMIN D2) 2000 UNITS TABS Take 1 tablet by mouth daily.        . fish oil-omega-3 fatty acids 1000 MG capsule Take 1 g by mouth daily.        . traMADol (ULTRAM) 50 MG tablet every 6 (six) hours as needed. As needed      . triamterene-hydrochlorothiazide (DYAZIDE) 37.5-25 MG per capsule Take 1 each (1 capsule total) by mouth daily.  90 capsule  3  . vitamin B-12 (CYANOCOBALAMIN) 1000 MCG tablet Take 1,000 mcg by mouth daily.          Review of Systems Review of Systems  Constitutional: Negative for fever, appetite change, fatigue and unexpected weight change.  Eyes: Negative for pain and visual disturbance.  Respiratory: Negative for cough and shortness of breath.   Cardiovascular: Negative for cp or palpitations    Gastrointestinal: Negative for nausea, diarrhea and constipation.  Genitourinary: Negative for urgency and  frequency.  Skin: Negative for pallor or rash   Neurological: Negative for weakness, light-headedness, numbness and headaches.  Hematological: Negative for adenopathy. Does not bruise/bleed easily.  Psychiatric/Behavioral: Negative for dysphoric mood. The patient is not nervous/anxious.          Objective:   Physical Exam  Constitutional: She appears well-developed and well-nourished. No distress.  HENT:  Head: Normocephalic and atraumatic.  Right Ear: External ear normal.  Left Ear: External ear normal.  Nose: Nose normal.  Mouth/Throat: Oropharynx is clear and moist.  Eyes: Conjunctivae and EOM are normal. Pupils are equal, round, and reactive to light. Right eye exhibits no discharge. Left eye exhibits no discharge.  Neck: Normal range of motion. Neck supple. No JVD present. Carotid bruit is not present. No thyromegaly present.  Cardiovascular: Normal rate, regular rhythm, normal heart sounds and intact distal pulses.  Exam reveals no gallop.   Pulmonary/Chest: Effort normal and breath sounds normal. No respiratory distress. She has no wheezes.  Abdominal: Soft. Bowel sounds are normal. She exhibits no distension, no abdominal bruit and no mass. There is no tenderness.  Musculoskeletal: Normal range of motion. She exhibits no edema and no tenderness.  Lymphadenopathy:    She has no cervical adenopathy.  Neurological: She is alert. She has normal reflexes. No cranial nerve deficit. She exhibits normal muscle tone. Coordination normal.  Skin: Skin is warm and dry. No rash noted. No erythema. No pallor.  Psychiatric: She has a normal mood and affect.          Assessment & Plan:

## 2011-05-21 NOTE — Assessment & Plan Note (Signed)
Due for dexa Some lost ht No fx On ca and D

## 2011-05-21 NOTE — Assessment & Plan Note (Signed)
Due for check today Diet controlled and pt has hx of HTN  Disc goals for lipids and reasons to control them Rev labs with pt from prev lipids Rev low sat fat diet in detail

## 2011-05-22 LAB — VITAMIN D 25 HYDROXY (VIT D DEFICIENCY, FRACTURES): Vit D, 25-Hydroxy: 45 ng/mL (ref 30–89)

## 2011-05-27 ENCOUNTER — Encounter: Payer: Self-pay | Admitting: Family Medicine

## 2011-05-27 ENCOUNTER — Ambulatory Visit (INDEPENDENT_AMBULATORY_CARE_PROVIDER_SITE_OTHER): Payer: Medicare Other | Admitting: Family Medicine

## 2011-05-27 ENCOUNTER — Telehealth: Payer: Self-pay | Admitting: Family Medicine

## 2011-05-27 VITALS — BP 136/82 | HR 72 | Temp 97.5°F | Ht 68.0 in | Wt 165.8 lb

## 2011-05-27 DIAGNOSIS — R7989 Other specified abnormal findings of blood chemistry: Secondary | ICD-10-CM

## 2011-05-27 DIAGNOSIS — N289 Disorder of kidney and ureter, unspecified: Secondary | ICD-10-CM

## 2011-05-27 DIAGNOSIS — R946 Abnormal results of thyroid function studies: Secondary | ICD-10-CM

## 2011-05-27 LAB — POCT URINALYSIS DIPSTICK
Glucose, UA: NEGATIVE
Nitrite, UA: NEGATIVE
Urobilinogen, UA: 0.2
pH, UA: 7

## 2011-05-27 LAB — RENAL FUNCTION PANEL
Albumin: 3.7 g/dL (ref 3.5–5.2)
Calcium: 9.9 mg/dL (ref 8.4–10.5)
Creatinine, Ser: 1 mg/dL (ref 0.4–1.2)
Glucose, Bld: 90 mg/dL (ref 70–99)

## 2011-05-27 LAB — POCT UA - MICROSCOPIC ONLY
Mucus, UA: 0
WBC, Ur, HPF, POC: 0
Yeast, UA: 0

## 2011-05-27 NOTE — Telephone Encounter (Signed)
Please let pt know that labs are improved Thyroid labs are all normal Cr -kidney number is back to normal off the dyazide - stay off of it  Keep drinking plenty of water

## 2011-05-27 NOTE — Assessment & Plan Note (Signed)
Unsure of etiology Clear ua today Disc imp of fluid intake  Stopped dyazide (bp ok)- will use supp hose for edema Renal panel today

## 2011-05-27 NOTE — Progress Notes (Signed)
Subjective:    Patient ID: [name], female    DOB: 05-09-1930, 78 y.o.   MRN: [id number]  HPI Here for f/u of high cr and also low tsh    Cr is 1.5   Chemistry      Component Value Date/Time   NA 139 05/21/2011 1354   K 4.2 05/21/2011 1354   CL 102 05/21/2011 1354   CO2 25 05/21/2011 1354   BUN 25* 05/21/2011 1354   CREATININE 1.5* 05/21/2011 1354      Component Value Date/Time   CALCIUM 10.5 05/21/2011 1354   ALKPHOS 69 05/21/2011 1354   AST 22 05/21/2011 1354   ALT 18 05/21/2011 1354   BILITOT 0.3 05/21/2011 1354     Bun is 25 Fluid intake- has started drinking more than she did (tries to drink more water) On dyazide - stopped that -- is noticing some more swelling in her ankles  Also does not add salt to things  bp ok at 136/82  Yesterday- a tiny bit of burning with urination  Today none  No frequency or urgency (except as expected drinking more water) Nocturia times 2   tsh low at .29 Unsure if significant  No family hx of thyroid problems  No symptoms at all   Patient Active Problem List  Diagnoses  . HYPERLIPIDEMIA  . HYPERTENSION  . CVA  . DIVERTICULOSIS, COLON  . HIP PAIN  . OSTEOPENIA  . PELVIC  PAIN  . URINALYSIS, ABNORMAL  . Syncope  . RLQ abdominal pain  . Renal insufficiency  . Low TSH level   Past Medical History  Diagnosis Date  . Hypertension   . Hyperlipidemia   . Osteopenia   . Diverticulosis   . DDD (degenerative disc disease)   . Chronic pain   . Stroke   . Syncope    Past Surgical History  Procedure Date  . Abdominal hysterectomy   . Tonsillectomy   . Exploratory laparotomy     2 times both neg   . Spine surgery     l3-l4 microdiscectomy   . Laminectomy 06-2003    l2-l4  . Doppler echocardiography     normal  . Carotid doppler     normal   History  Substance Use Topics  . Smoking status: Never Smoker   . Smokeless tobacco: Never Used  . Alcohol Use: No   Family History  Problem Relation Age of Onset  . Hypertension  Father    Allergies  Allergen Reactions  . Celecoxib     REACTION: affects kidneys  . Codeine Nausea Only    And dizziness  . Etodolac     REACTION: Rash   Current Outpatient Prescriptions on File Prior to Visit  Medication Sig Dispense Refill  . aspirin 325 MG tablet Take 325 mg by mouth daily.        . Calcium Carbonate-Vit D-Min 600-400 MG-UNIT TABS Take 1 tablet by mouth daily.        . Ergocalciferol (VITAMIN D2) 2000 UNITS TABS Take 1 tablet by mouth daily.        . fish oil-omega-3 fatty acids 1000 MG capsule Take 1 g by mouth daily.        . traMADol (ULTRAM) 50 MG tablet every 6 (six) hours as needed. As needed      . vitamin B-12 (CYANOCOBALAMIN) 1000 MCG tablet Take 1,000 mcg by mouth daily.             Review  of Systems Review of Systems  Constitutional: Negative for fever, appetite change, fatigue and unexpected weight change.  Eyes: Negative for pain and visual disturbance.  Respiratory: Negative for cough and shortness of breath.   Cardiovascular: Negative for cp or palpitations    Gastrointestinal: Negative for nausea, diarrhea and constipation.  Genitourinary: Negative for urgency and frequency. neg for blood in urine or back pain  Endo: neg for thirst/ excessive urination/ fatigue/ hyperactiveness/ nervousness/ wt loss or goiter  Skin: Negative for pallor or rash   Neurological: Negative for weakness, light-headedness, numbness and headaches.  Hematological: Negative for adenopathy. Does not bruise/bleed easily.  Psychiatric/Behavioral: Negative for dysphoric mood. The patient is not nervous/anxious.          Objective:   Physical Exam  Constitutional: She appears well-developed and well-nourished. No distress.  HENT:  Head: Normocephalic and atraumatic.  Right Ear: External ear normal.  Left Ear: External ear normal.  Nose: Nose normal.  Mouth/Throat: Oropharynx is clear and moist.  Eyes: Conjunctivae and EOM are normal. Pupils are equal, round, and  reactive to light. No scleral icterus.  Neck: Normal range of motion. Neck supple. No JVD present. Carotid bruit is not present. Erythema present. No mass and no thyromegaly present.  Cardiovascular: Normal rate, regular rhythm, normal heart sounds and intact distal pulses.  Exam reveals no gallop.   Pulmonary/Chest: Effort normal and breath sounds normal. No respiratory distress. She has no wheezes.  Abdominal: Soft. Bowel sounds are normal. She exhibits no distension, no abdominal bruit and no mass. There is no tenderness. There is no rebound and no guarding.  Musculoskeletal: She exhibits no edema and no tenderness.  Lymphadenopathy:    She has no cervical adenopathy.  Neurological: She is alert. She has normal reflexes. No cranial nerve deficit. She exhibits normal muscle tone. Coordination normal.  Skin: Skin is warm and dry. No rash noted. No erythema. No pallor.  Psychiatric: She has a normal mood and affect.          Assessment & Plan:

## 2011-05-27 NOTE — Patient Instructions (Signed)
Drink lots of fluids- primarily water  Also avoid salt and salty foods Elevate feet whenever you sit  Think about trying some knee highs with support  Kidney labs and thyroid labs today Then we will make a plan  Urine test ok today

## 2011-05-27 NOTE — Assessment & Plan Note (Signed)
No symptoms  Thyroid panel today

## 2011-05-28 NOTE — Telephone Encounter (Signed)
Patient advised as instructed via telephone. 

## 2011-05-29 ENCOUNTER — Ambulatory Visit (INDEPENDENT_AMBULATORY_CARE_PROVIDER_SITE_OTHER)
Admission: RE | Admit: 2011-05-29 | Discharge: 2011-05-29 | Disposition: A | Discharge status: Home / Self Care | Payer: Medicare Other | Source: Ambulatory Visit | Attending: Family Medicine | Admitting: Family Medicine

## 2011-05-29 DIAGNOSIS — M949 Disorder of cartilage, unspecified: Secondary | ICD-10-CM

## 2011-05-29 DIAGNOSIS — M899 Disorder of bone, unspecified: Secondary | ICD-10-CM

## 2011-05-31 ENCOUNTER — Telehealth: Payer: Self-pay

## 2011-05-31 NOTE — Telephone Encounter (Signed)
This morning left side of lower back between spine and hip began to hurt on and off with movement. No UTI symptoms, no fever and no known injury. Dr Milinda Antis said apply heat tonight and if condition worsens go to ER otherwise pt has appt Sat clinic at 9:30am. Pt notified while on phone.

## 2011-06-01 ENCOUNTER — Other Ambulatory Visit: Payer: Self-pay | Admitting: Family Medicine

## 2011-06-01 ENCOUNTER — Ambulatory Visit (INDEPENDENT_AMBULATORY_CARE_PROVIDER_SITE_OTHER): Payer: Medicare Other | Admitting: Family Medicine

## 2011-06-01 ENCOUNTER — Encounter: Payer: Self-pay | Admitting: Family Medicine

## 2011-06-01 VITALS — BP 160/88 | Temp 97.8°F | Wt 168.0 lb

## 2011-06-01 DIAGNOSIS — R1032 Left lower quadrant pain: Secondary | ICD-10-CM

## 2011-06-01 DIAGNOSIS — R600 Localized edema: Secondary | ICD-10-CM

## 2011-06-01 DIAGNOSIS — M549 Dorsalgia, unspecified: Secondary | ICD-10-CM

## 2011-06-01 DIAGNOSIS — R609 Edema, unspecified: Secondary | ICD-10-CM

## 2011-06-01 DIAGNOSIS — R109 Unspecified abdominal pain: Secondary | ICD-10-CM

## 2011-06-01 LAB — COMPREHENSIVE METABOLIC PANEL
Alkaline Phosphatase: 71 U/L (ref 39–117)
BUN: 14 mg/dL (ref 6–23)
Glucose, Bld: 68 mg/dL — ABNORMAL LOW (ref 70–99)
Total Bilirubin: 0.3 mg/dL (ref 0.3–1.2)

## 2011-06-01 LAB — POCT URINALYSIS DIPSTICK
Bilirubin, UA: NEGATIVE
Blood, UA: NEGATIVE
Nitrite, UA: NEGATIVE
Protein, UA: NEGATIVE
pH, UA: 6

## 2011-06-01 NOTE — Progress Notes (Addendum)
Subjective:    Patient ID: [name], female    DOB: 07/04/1930, 78 y.o.   MRN: [id number]  HPI Yesterday started feeling a grabbing pain on her left side above her hip bone. Noticed it when getting in and out of the car.  Feels a sore spot towards her back. She could have been from mowing the yard.  Says her feet have been swelling the last couple of days as well. Her fluid pill was stopped about a week ago for renal insufficiency.  Her kidneys were rechecked off the pill and it was back to normal. Did take 2 IBU yesterday for pain relief. Today actually feels better.  No constipatoin. No dysuria.  Some inc frequency in urination.  Says hx of back sugery.  Hx of shingles on the right shoulder and chest.     Review of Systems     BP 160/88  Temp(Src) 97.8 F (36.6 C) (Oral)  Wt 168 lb (76.204 kg)    Allergies  Allergen Reactions  . Celecoxib     REACTION: affects kidneys  . Codeine Nausea Only    And dizziness  . Etodolac     REACTION: Rash    Past Medical History  Diagnosis Date  . Hypertension   . Hyperlipidemia   . Osteopenia   . Diverticulosis   . DDD (degenerative disc disease)   . Chronic pain   . Stroke   . Syncope     Past Surgical History  Procedure Date  . Abdominal hysterectomy   . Tonsillectomy   . Exploratory laparotomy     2 times both neg   . Spine surgery     l3-l4 microdiscectomy   . Laminectomy 06-2003    l2-l4  . Doppler echocardiography     normal  . Carotid doppler     normal    History   Social History  . Marital Status: Single    Spouse Name: N/A    Number of Children: N/A  . Years of Education: N/A   Occupational History  . retired    Social History Main Topics  . Smoking status: Never Smoker   . Smokeless tobacco: Never Used  . Alcohol Use: No  . Drug Use: No  . Sexually Active: Not on file   Other Topics Concern  . Not on file   Social History Narrative  . No narrative on file    Family History  Problem  Relation Age of Onset  . Hypertension Father     Outpatient Encounter Prescriptions as of 06/01/2011  Medication Sig Dispense Refill  . aspirin 325 MG tablet Take 325 mg by mouth daily.        . Calcium Carbonate-Vit D-Min 600-400 MG-UNIT TABS Take 1 tablet by mouth daily.        . Ergocalciferol (VITAMIN D2) 2000 UNITS TABS Take 1 tablet by mouth daily.        . fish oil-omega-3 fatty acids 1000 MG capsule Take 1 g by mouth daily.        . traMADol (ULTRAM) 50 MG tablet every 6 (six) hours as needed. As needed      . vitamin B-12 (CYANOCOBALAMIN) 1000 MCG tablet Take 1,000 mcg by mouth daily.             Objective:   Physical Exam  Constitutional: She is oriented to person, place, and time. She appears well-developed and well-nourished.  HENT:  Head: Normocephalic and atraumatic.  Cardiovascular: Normal rate,  regular rhythm and normal heart sounds.   Pulmonary/Chest: Effort normal and breath sounds normal.  Abdominal: Soft. Bowel sounds are normal. She exhibits no distension and no mass. There is tenderness. There is no rebound and no guarding.       Mildly tender in the LLQ.   Musculoskeletal: She exhibits edema.       2+ IN HER FEET biLAT. Very tender over her Left flank with + CVA tenderness.   Neurological: She is alert and oriented to person, place, and time.  Skin: Skin is warm and dry.  Psychiatric: She has a normal mood and affect. Her behavior is normal.          Assessment & Plan:  Left flank pain - Likley MSK.  UA is normal. No sign of infection. No fever but still consider diverticulitis. Check CBC and CMP to eval renal function and for infection. Marland Kitchen  UA is normal. No sign of hematuria or infection. For now can use Aleve or IBU as needed.  Can apply heating pad for 10-15 min at a time and gentle stretches.   LE edema - Discussed will need compression stockings since her fluid pill was d/c due ot renal insufficiencey. She mentions that Dr. Milinda Antis had told her the same  thing. Needs to salt restrict as well. Ok to take left over fluid pill today only.

## 2011-06-02 LAB — CBC WITH DIFFERENTIAL/PLATELET
Basophils Relative: 0 % (ref 0–1)
Eosinophils Absolute: 0.3 10*3/uL (ref 0.0–0.7)
HCT: 35.7 % — ABNORMAL LOW (ref 36.0–46.0)
Hemoglobin: 11.8 g/dL — ABNORMAL LOW (ref 12.0–15.0)
MCH: 32.3 pg (ref 26.0–34.0)
MCHC: 33.1 g/dL (ref 30.0–36.0)
Monocytes Absolute: 0.8 10*3/uL (ref 0.1–1.0)
Monocytes Relative: 12 % (ref 3–12)
Neutrophils Relative %: 52 % (ref 43–77)

## 2011-06-17 ENCOUNTER — Ambulatory Visit (INDEPENDENT_AMBULATORY_CARE_PROVIDER_SITE_OTHER): Payer: Medicare Other | Admitting: Family Medicine

## 2011-06-17 ENCOUNTER — Encounter: Payer: Self-pay | Admitting: Family Medicine

## 2011-06-17 VITALS — BP 122/82 | HR 73 | Temp 98.7°F | Ht 68.0 in | Wt 164.8 lb

## 2011-06-17 DIAGNOSIS — I10 Essential (primary) hypertension: Secondary | ICD-10-CM

## 2011-06-17 DIAGNOSIS — R946 Abnormal results of thyroid function studies: Secondary | ICD-10-CM

## 2011-06-17 DIAGNOSIS — N289 Disorder of kidney and ureter, unspecified: Secondary | ICD-10-CM

## 2011-06-17 DIAGNOSIS — R7989 Other specified abnormal findings of blood chemistry: Secondary | ICD-10-CM

## 2011-06-17 NOTE — Progress Notes (Signed)
Subjective:    Patient ID: [name], female    DOB: 03/20/1930, 78 y.o.   MRN: [id number]  HPI Here for f/u of HTN and renal insuff and thyroid and mild anemia  Has been feeling about like usual  Nothing new going on   BP Readings from Last 3 Encounters:  06/17/11 122/82  06/01/11 160/88  05/27/11 136/82   has been ok without med  Off dyazide due to renal insuff- has only taken one pill since (on a very swollen day)  That is better now   Chemistry      Component Value Date/Time   NA 142 06/01/2011 1029   K 4.3 06/01/2011 1029   CL 106 06/01/2011 1029   CO2 24 06/01/2011 1029   BUN 14 06/01/2011 1029   CREATININE 0.90 06/01/2011 1029   CREATININE 1.0 05/27/2011 1251      Component Value Date/Time   CALCIUM 9.9 06/01/2011 1029   ALKPHOS 71 06/01/2011 1029   AST 20 06/01/2011 1029   ALT 16 06/01/2011 1029   BILITOT 0.3 06/01/2011 1029     off the diuretic - gets swollen occas but not every single day  Tries to drink as much water as she can Avoiding salt    tsh low at one time Lab Results  Component Value Date   TSH 0.49 05/27/2011   this is normalized now Wt is down 4 lb with bmi of 25 No skin or hair changes or fatigue   Hg 11.8- this is stable Lab Results  Component Value Date   WBC 6.7 06/01/2011   HGB 11.8* 06/01/2011   HCT 35.7* 06/01/2011   MCV 97.8 06/01/2011   PLT 225 06/01/2011    Patient Active Problem List  Diagnoses  . HYPERLIPIDEMIA  . HYPERTENSION  . CVA  . DIVERTICULOSIS, COLON  . HIP PAIN  . OSTEOPENIA  . PELVIC  PAIN  . URINALYSIS, ABNORMAL  . Syncope  . RLQ abdominal pain  . Renal insufficiency  . Low TSH level   Past Medical History  Diagnosis Date  . Hypertension   . Hyperlipidemia   . Osteopenia   . Diverticulosis   . DDD (degenerative disc disease)   . Chronic pain   . Stroke   . Syncope    Past Surgical History  Procedure Date  . Abdominal hysterectomy   . Tonsillectomy   . Exploratory laparotomy     2 times both neg     . Spine surgery     l3-l4 microdiscectomy   . Laminectomy 06-2003    l2-l4  . Doppler echocardiography     normal  . Carotid doppler     normal   History  Substance Use Topics  . Smoking status: Never Smoker   . Smokeless tobacco: Never Used  . Alcohol Use: No   Family History  Problem Relation Age of Onset  . Hypertension Father    Allergies  Allergen Reactions  . Celecoxib     REACTION: affects kidneys  . Codeine Nausea Only    And dizziness  . Etodolac     REACTION: Rash   Current Outpatient Prescriptions on File Prior to Visit  Medication Sig Dispense Refill  . aspirin 325 MG tablet Take 325 mg by mouth daily.        . Calcium Carbonate-Vit D-Min 600-400 MG-UNIT TABS Take 1 tablet by mouth daily.        . Ergocalciferol (VITAMIN D2) 2000 UNITS TABS Take 1 tablet  by mouth daily.        . fish oil-omega-3 fatty acids 1000 MG capsule Take 1 g by mouth daily.        . traMADol (ULTRAM) 50 MG tablet every 6 (six) hours as needed. As needed      . vitamin B-12 (CYANOCOBALAMIN) 1000 MCG tablet Take 1,000 mcg by mouth daily.           Review of Systems Review of Systems  Constitutional: Negative for fever, appetite change, fatigue and unexpected weight change.  Eyes: Negative for pain and visual disturbance.  Respiratory: Negative for cough and shortness of breath.   Cardiovascular: Negative for cp or palpitations    Gastrointestinal: Negative for nausea, diarrhea and constipation.  Genitourinary: Negative for urgency and frequency.  Skin: Negative for pallor or rash   Neurological: Negative for weakness, light-headedness, numbness and headaches.  Hematological: Negative for adenopathy. Does not bruise/bleed easily.  Psychiatric/Behavioral: Negative for dysphoric mood. The patient is not nervous/anxious.         Objective:   Physical Exam  Constitutional: She appears well-developed and well-nourished. No distress.  HENT:  Head: Normocephalic and atraumatic.   Mouth/Throat: Oropharynx is clear and moist.  Eyes: Conjunctivae and EOM are normal. Pupils are equal, round, and reactive to light. No scleral icterus.  Neck: Normal range of motion. Neck supple. No JVD present. Carotid bruit is not present. Erythema present. No thyromegaly present.  Cardiovascular: Normal rate, regular rhythm, normal heart sounds and intact distal pulses.  Exam reveals no gallop.   Pulmonary/Chest: Effort normal and breath sounds normal. No respiratory distress. She has no wheezes.  Abdominal: Soft. Bowel sounds are normal. She exhibits no distension, no abdominal bruit and no mass. There is no tenderness.  Musculoskeletal: She exhibits no edema and no tenderness.  Lymphadenopathy:    She has no cervical adenopathy.  Neurological: She is alert. She has normal reflexes. No cranial nerve deficit. She exhibits normal muscle tone. Coordination normal.  Skin: Skin is warm and dry. No rash noted. No erythema. No pallor.  Psychiatric: She has a normal mood and affect.          Assessment & Plan:

## 2011-06-17 NOTE — Assessment & Plan Note (Signed)
Improved much off of dyzaide  If she needs something else for edema in future- may want to try lasix instead Rev latest lab with pt

## 2011-06-17 NOTE — Assessment & Plan Note (Signed)
This resolved on 2nd check No problems now

## 2011-06-17 NOTE — Assessment & Plan Note (Signed)
In fair control with lifestyle change at this time - no meds If edema re occurs will need to try different diuretic Renal fxn better now

## 2011-06-17 NOTE — Patient Instructions (Signed)
Blood pressure is ok off all medicine - we will continue to watch this  If swelling comes back let me know - we will need to select a different diuretic  Stay away from salt and keep drinking your water  I'm glad you are feeling better  Follow up in 6 months

## 2011-09-30 ENCOUNTER — Ambulatory Visit (INDEPENDENT_AMBULATORY_CARE_PROVIDER_SITE_OTHER): Payer: Medicare Other | Admitting: Family Medicine

## 2011-09-30 ENCOUNTER — Encounter: Payer: Self-pay | Admitting: Family Medicine

## 2011-09-30 VITALS — BP 130/72 | HR 78 | Temp 97.9°F | Ht 65.0 in | Wt 155.2 lb

## 2011-09-30 DIAGNOSIS — R109 Unspecified abdominal pain: Secondary | ICD-10-CM

## 2011-09-30 DIAGNOSIS — M546 Pain in thoracic spine: Secondary | ICD-10-CM | POA: Insufficient documentation

## 2011-09-30 DIAGNOSIS — M419 Scoliosis, unspecified: Secondary | ICD-10-CM

## 2011-09-30 DIAGNOSIS — R102 Pelvic and perineal pain: Secondary | ICD-10-CM | POA: Insufficient documentation

## 2011-09-30 DIAGNOSIS — IMO0002 Reserved for concepts with insufficient information to code with codable children: Secondary | ICD-10-CM

## 2011-09-30 DIAGNOSIS — M5136 Other intervertebral disc degeneration, lumbar region: Secondary | ICD-10-CM | POA: Insufficient documentation

## 2011-09-30 DIAGNOSIS — M4120 Other idiopathic scoliosis, site unspecified: Secondary | ICD-10-CM

## 2011-09-30 LAB — POCT URINALYSIS DIPSTICK
Glucose, UA: NEGATIVE
Ketones, UA: NEGATIVE
Leukocytes, UA: NEGATIVE
Urobilinogen, UA: 0.2

## 2011-09-30 LAB — POCT UA - MICROSCOPIC ONLY: Yeast, UA: 0

## 2011-09-30 MED ORDER — TRAMADOL HCL 50 MG PO TABS: 50.0000 mg | ORAL_TABLET | Freq: Four times a day (QID) | ORAL | Status: DC | PRN

## 2011-09-30 NOTE — Assessment & Plan Note (Signed)
Pt has had several surgeries and also has severe scoliosis Having ref pain at TS level now  Will f/u with Dr Ophelia Charter to disc tx options as she wants to stay active

## 2011-09-30 NOTE — Progress Notes (Signed)
Subjective:    Patient ID: [name], female    DOB: [birth year], 78 y.o.   MRN: [id number]  HPI Having more thoracic pain  Needs to call Dr Ophelia Charter  Contemplating surgery Takes an hour to get out of bed  No burning to urinate   More trouble bathing and doing ADLs Knows she is supposed to use her cane in L hand     Chemistry      Component Value Date/Time   NA 142 06/01/2011 1029   K 4.3 06/01/2011 1029   CL 106 06/01/2011 1029   CO2 24 06/01/2011 1029   BUN 14 06/01/2011 1029   CREATININE 0.90 06/01/2011 1029   CREATININE 1.0 05/27/2011 1251      Component Value Date/Time   CALCIUM 9.9 06/01/2011 1029   ALKPHOS 71 06/01/2011 1029   AST 20 06/01/2011 1029   ALT 16 06/01/2011 1029   BILITOT 0.3 06/01/2011 1029       Standing up straight is difficult  Some pain ref across her low abdomen - this comes and goes No fever No bowel change or blood in stool   Patient Active Problem List  Diagnosis  . HYPERLIPIDEMIA  . HYPERTENSION  . CVA  . DIVERTICULOSIS, COLON  . HIP PAIN  . OSTEOPENIA  . PELVIC  PAIN  . URINALYSIS, ABNORMAL  . Syncope  . RLQ abdominal pain  . Renal insufficiency  . Low TSH level  . Suprapubic abdominal pain   Past Medical History  Diagnosis Date  . Hypertension   . Hyperlipidemia   . Osteopenia   . Diverticulosis   . DDD (degenerative disc disease)   . Chronic pain   . Stroke   . Syncope    Past Surgical History  Procedure Date  . Abdominal hysterectomy   . Tonsillectomy   . Exploratory laparotomy     2 times both neg   . Spine surgery     l3-l4 microdiscectomy   . Laminectomy 06-2003    l2-l4  . Doppler echocardiography     normal  . Carotid doppler     normal   History  Substance Use Topics  . Smoking status: Never Smoker   . Smokeless tobacco: Never Used  . Alcohol Use: No   Family History  Problem Relation Age of Onset  . Hypertension Father    Allergies  Allergen Reactions  . Celecoxib     REACTION: affects kidneys    . Codeine Nausea Only    And dizziness  . Etodolac     REACTION: Rash   Current Outpatient Prescriptions on File Prior to Visit  Medication Sig Dispense Refill  . aspirin 325 MG tablet Take 325 mg by mouth daily.        . Calcium Carbonate-Vit D-Min 600-400 MG-UNIT TABS Take 1 tablet by mouth daily.        . Ergocalciferol (VITAMIN D2) 2000 UNITS TABS Take 1 tablet by mouth daily.        . fish oil-omega-3 fatty acids 1000 MG capsule Take 1 g by mouth daily.        . traMADol (ULTRAM) 50 MG tablet every 6 (six) hours as needed. As needed      . vitamin B-12 (CYANOCOBALAMIN) 1000 MCG tablet Take 1,000 mcg by mouth daily.          Will need to change her tramadol px to me from Dr Jed Limerick 1-2 a day depending  She gets 50 at  a time  Will call when she needs a refil       Review of Systems Review of Systems  Constitutional: Negative for fever, appetite change, fatigue and unexpected weight change.  Eyes: Negative for pain and visual disturbance.  Respiratory: Negative for cough and shortness of breath.   Cardiovascular: Negative for cp or palpitations    Gastrointestinal: Negative for nausea, diarrhea and constipation. neg for blood in stool Genitourinary: Negative for urgency and frequency. neg for hematuria or nausea  Skin: Negative for pallor or rash   Neurological: Negative for weakness, light-headedness, numbness and headaches.  Hematological: Negative for adenopathy. Does not bruise/bleed easily.  Psychiatric/Behavioral: Negative for dysphoric mood. The patient is not nervous/anxious.         Objective:   Physical Exam  Constitutional: She appears well-developed and well-nourished. No distress.  HENT:  Head: Normocephalic and atraumatic.  Mouth/Throat: Oropharynx is clear and moist.  Eyes: Conjunctivae and EOM are normal. Pupils are equal, round, and reactive to light. Right eye exhibits no discharge. Left eye exhibits no discharge.  Neck: Normal range of motion.  Neck supple. No JVD present. Carotid bruit is not present. No thyromegaly present.  Cardiovascular: Normal rate, regular rhythm and normal heart sounds.   Pulmonary/Chest: Effort normal and breath sounds normal. No respiratory distress. She has no wheezes.  Abdominal: Soft. Bowel sounds are normal. She exhibits no distension, no abdominal bruit and no mass. There is no tenderness. There is no rebound and no guarding.       No suprapubic tenderness or fullness  No cva tenderness   Musculoskeletal: She exhibits tenderness. She exhibits no edema.       Tender  TS and LS with notable moderate scoliosis -baseline Very poor rom Gait is affected   Lymphadenopathy:    She has no cervical adenopathy.  Neurological: She is alert. She has normal reflexes. She displays no atrophy. No cranial nerve deficit or sensory deficit. She exhibits normal muscle tone. Coordination normal.       Gait is labored and unsteady- pt needs her cane  Skin: Skin is warm and dry. No rash noted. No erythema. No pallor.  Psychiatric: She has a normal mood and affect.          Assessment & Plan:

## 2011-09-30 NOTE — Patient Instructions (Addendum)
If you develop fever or bowel changes- please call Urine test is clear today  We will refer you to Dr Ophelia Charter at check out  From now on use your cane in left hand to help balance with your pain

## 2011-09-30 NOTE — Assessment & Plan Note (Signed)
Nl exam and ua  Suspect this may be ref pain from spine as she is not tender nor does she have other symptoms  Will f/u with ortho  In meantime disc red flags to watch for diverticular flare and other conditions

## 2011-09-30 NOTE — Assessment & Plan Note (Signed)
Pt has chronic worsening thoracic/ lumbar back pain from scoliosis and also disc dz-contemplating surgery I think some of her pain in abdomen is referred from this but will keep an eye on it  Made ref to return to Dr Ophelia Charter I took over her ultram px today -- 86 with one refil also - she takes 1-2 daily with caution Urged her to start using her cane on the L

## 2011-09-30 NOTE — Assessment & Plan Note (Signed)
Mod to severe causing TS and LS pain

## 2011-11-16 ENCOUNTER — Emergency Department (HOSPITAL_COMMUNITY): Payer: Medicare Other

## 2011-11-16 ENCOUNTER — Encounter (HOSPITAL_COMMUNITY): Payer: Self-pay | Admitting: *Deleted

## 2011-11-16 ENCOUNTER — Inpatient Hospital Stay (HOSPITAL_COMMUNITY)
Admission: EM | Admit: 2011-11-16 | Discharge: 2011-11-21 | DRG: 066 | Disposition: A | Discharge status: Skilled Nursing Facility | Payer: Medicare Other | Attending: Internal Medicine | Admitting: Internal Medicine

## 2011-11-16 DIAGNOSIS — G8929 Other chronic pain: Secondary | ICD-10-CM | POA: Diagnosis present

## 2011-11-16 DIAGNOSIS — I10 Essential (primary) hypertension: Secondary | ICD-10-CM | POA: Diagnosis present

## 2011-11-16 DIAGNOSIS — I720 Aneurysm of carotid artery: Secondary | ICD-10-CM | POA: Diagnosis present

## 2011-11-16 DIAGNOSIS — Z23 Encounter for immunization: Secondary | ICD-10-CM

## 2011-11-16 DIAGNOSIS — I639 Cerebral infarction, unspecified: Secondary | ICD-10-CM

## 2011-11-16 DIAGNOSIS — Z7902 Long term (current) use of antithrombotics/antiplatelets: Secondary | ICD-10-CM

## 2011-11-16 DIAGNOSIS — E213 Hyperparathyroidism, unspecified: Secondary | ICD-10-CM | POA: Diagnosis present

## 2011-11-16 DIAGNOSIS — Z888 Allergy status to other drugs, medicaments and biological substances status: Secondary | ICD-10-CM

## 2011-11-16 DIAGNOSIS — E785 Hyperlipidemia, unspecified: Secondary | ICD-10-CM | POA: Diagnosis present

## 2011-11-16 DIAGNOSIS — K5900 Constipation, unspecified: Secondary | ICD-10-CM | POA: Diagnosis present

## 2011-11-16 DIAGNOSIS — Z8673 Personal history of transient ischemic attack (TIA), and cerebral infarction without residual deficits: Secondary | ICD-10-CM

## 2011-11-16 DIAGNOSIS — I6350 Cerebral infarction due to unspecified occlusion or stenosis of unspecified cerebral artery: Secondary | ICD-10-CM | POA: Diagnosis present

## 2011-11-16 DIAGNOSIS — K5730 Diverticulosis of large intestine without perforation or abscess without bleeding: Secondary | ICD-10-CM | POA: Diagnosis present

## 2011-11-16 DIAGNOSIS — Z79899 Other long term (current) drug therapy: Secondary | ICD-10-CM

## 2011-11-16 DIAGNOSIS — Z8249 Family history of ischemic heart disease and other diseases of the circulatory system: Secondary | ICD-10-CM

## 2011-11-16 LAB — CBC WITH DIFFERENTIAL/PLATELET
HCT: 36.7 % (ref 36.0–46.0)
Hemoglobin: 12.9 g/dL (ref 12.0–15.0)
Lymphocytes Relative: 10 % — ABNORMAL LOW (ref 12–46)
Lymphs Abs: 0.9 10*3/uL (ref 0.7–4.0)
Monocytes Absolute: 0.3 10*3/uL (ref 0.1–1.0)
Monocytes Relative: 3 % (ref 3–12)
Neutro Abs: 7 10*3/uL (ref 1.7–7.7)
WBC: 8.2 10*3/uL (ref 4.0–10.5)

## 2011-11-16 LAB — COMPREHENSIVE METABOLIC PANEL
AST: 19 U/L (ref 0–37)
Albumin: 3.9 g/dL (ref 3.5–5.2)
Alkaline Phosphatase: 77 U/L (ref 39–117)
Chloride: 99 mEq/L (ref 96–112)
Potassium: 3.9 mEq/L (ref 3.5–5.1)
Sodium: 135 mEq/L (ref 135–145)
Total Bilirubin: 0.4 mg/dL (ref 0.3–1.2)
Total Protein: 7.4 g/dL (ref 6.0–8.3)

## 2011-11-16 LAB — URINALYSIS, ROUTINE W REFLEX MICROSCOPIC
Glucose, UA: NEGATIVE mg/dL
Hgb urine dipstick: NEGATIVE
Ketones, ur: 15 mg/dL — AB
Protein, ur: 100 mg/dL — AB
pH: 8 (ref 5.0–8.0)

## 2011-11-16 LAB — URINE MICROSCOPIC-ADD ON

## 2011-11-16 MED ORDER — ONDANSETRON HCL 4 MG/2ML IJ SOLN
INTRAMUSCULAR | Status: AC
  Administered 2011-11-16: 4 mg
  Filled 2011-11-16: qty 2

## 2011-11-16 NOTE — ED Notes (Signed)
Per ems report, the patient has had a headache all day. Pt family reports that they went by to see her and she does not look good. Additionally, pt was vomiting on arrival by ems. They started a line and gave zofran. Pt having epsiodes of bradycardia en route. Dr. Manus Gunning alerted on pt condition on arrival

## 2011-11-16 NOTE — ED Notes (Signed)
Patient transported to CT 

## 2011-11-16 NOTE — ED Notes (Signed)
MD at bedside. 

## 2011-11-16 NOTE — ED Notes (Signed)
Pt family member at the bedside reports they went to see the patient today and when they got to her house she was lying on the floor without her clothes on and she was disoriented

## 2011-11-17 ENCOUNTER — Encounter (HOSPITAL_COMMUNITY): Payer: Self-pay | Admitting: Internal Medicine

## 2011-11-17 ENCOUNTER — Inpatient Hospital Stay (HOSPITAL_COMMUNITY): Payer: Medicare Other

## 2011-11-17 DIAGNOSIS — I6350 Cerebral infarction due to unspecified occlusion or stenosis of unspecified cerebral artery: Principal | ICD-10-CM

## 2011-11-17 LAB — GRAM STAIN

## 2011-11-17 LAB — LIPID PANEL
Cholesterol: 181 mg/dL (ref 0–200)
HDL: 79 mg/dL (ref >39)
LDL Cholesterol: 88 mg/dL (ref 0–99)
Total CHOL/HDL Ratio: 2.3 RATIO
Triglycerides: 69 mg/dL (ref <150)
VLDL: 14 mg/dL (ref 0–40)

## 2011-11-17 LAB — HEMOGLOBIN A1C
Hgb A1c MFr Bld: 5.9 % — ABNORMAL HIGH (ref <5.7)
Mean Plasma Glucose: 123 mg/dL — ABNORMAL HIGH (ref <117)

## 2011-11-17 MED ORDER — SENNOSIDES-DOCUSATE SODIUM 8.6-50 MG PO TABS: 1.0000 | ORAL_TABLET | Freq: Every evening | ORAL | Status: DC | PRN

## 2011-11-17 MED ORDER — ACETAMINOPHEN 325 MG PO TABS
325.0000 mg | ORAL_TABLET | Freq: Four times a day (QID) | ORAL | Status: DC | PRN
  Administered 2011-11-17: 325 mg via ORAL
  Filled 2011-11-17: qty 1

## 2011-11-17 MED ORDER — INFLUENZA VIRUS VACC SPLIT PF IM SUSP
0.5000 mL | INTRAMUSCULAR | Status: AC
  Administered 2011-11-18: 0.5 mL via INTRAMUSCULAR
  Filled 2011-11-17: qty 0.5

## 2011-11-17 MED ORDER — HYDROMORPHONE HCL PF 1 MG/ML IJ SOLN: 0.5000 mg | INTRAMUSCULAR | Status: DC | PRN

## 2011-11-17 MED ORDER — PNEUMOCOCCAL VAC POLYVALENT 25 MCG/0.5ML IJ INJ
0.5000 mL | INJECTION | INTRAMUSCULAR | Status: AC
  Administered 2011-11-18: 0.5 mL via INTRAMUSCULAR
  Filled 2011-11-17: qty 0.5

## 2011-11-17 MED ORDER — ASPIRIN 300 MG RE SUPP
300.0000 mg | Freq: Every day | RECTAL | Status: DC
  Filled 2011-11-17 (×3): qty 1

## 2011-11-17 MED ORDER — ASPIRIN 325 MG PO TABS
325.0000 mg | ORAL_TABLET | Freq: Every day | ORAL | Status: DC
  Administered 2011-11-17 – 2011-11-18 (×2): 325 mg via ORAL
  Filled 2011-11-17 (×3): qty 1

## 2011-11-17 MED ORDER — ENOXAPARIN SODIUM 40 MG/0.4ML ~~LOC~~ SOLN
40.0000 mg | Freq: Every day | SUBCUTANEOUS | Status: DC
  Administered 2011-11-17 – 2011-11-21 (×5): 40 mg via SUBCUTANEOUS
  Filled 2011-11-17 (×5): qty 0.4

## 2011-11-17 NOTE — ED Notes (Signed)
Admitting MD is in with patient at this time for admission assessment. Report has been called, awaiting orders for transport

## 2011-11-17 NOTE — Progress Notes (Signed)
Rehab Admissions Coordinator Note:  Patient was screened by Clois Dupes for appropriateness for an Inpatient Acute Rehab Consult. PT and OT are recommending an inpt rehab consult. At this time, we are recommending Inpatient Rehab consult.  Clois Dupes 11/17/2011, 6:42 PM  I can be reached at [phone].

## 2011-11-17 NOTE — Plan of Care (Signed)
[facility] PROGRESS NOTE  [name] RUE:[id number] DOB: 1930/10/10 DOA: 11/16/2011 PCP: Roxy Manns, MD  Pt stable in the room and doing well.No issues noted. Family (including brother and relatives) visiting the patient. Patient is sitting up in the chair. She feels much better and does not have any other issues. MRI findings were conveyed to the patient and other family members. They were pleased to hear the findings. Patient's symptoms are better also. Awaiting further study results prior to discharge.   Studies: Dg Chest 2 View  11/17/2011  *RADIOLOGY REPORT*  Clinical Data: Altered mental status, stroke  CHEST - 2 VIEW  Comparison: 04/10/2007  Findings: Heart size upper limits normal.  Lungs clear.  No effusion.  Mild spurring in the lower thoracic spine.  IMPRESSION:  No acute disease   Original Report Authenticated By: Osa Craver, M.D.    Ct Head Wo Contrast  11/16/2011  *RADIOLOGY REPORT*  Clinical Data: Headache and vomiting.  CT HEAD WITHOUT CONTRAST  Technique:  Contiguous axial images were obtained from the base of the skull through the vertex without contrast.  Comparison: Brain MR dated 02/14/2008 and head CT dated 02/13/2008.  Findings: Oval area of ill-defined low density in the inferior left cerebellar hemisphere.  More inferiorly, an old infarct is noted in the left inferior cerebellar hemisphere. There is there is also a small oval area of decreased density in the inferior left basal ganglia which was lower in density in better defined previously, compatible with old lacunar infarct.  Mild patchy white matter low density in both cerebral hemispheres.  Mildly enlarged ventricles and subarachnoid spaces.  No intracranial hemorrhage or mass lesion.  Unremarkable bones and included paranasal sinuses.  IMPRESSION:  1.  Probable subacute left cerebellar hemisphere infarct. 2.  Additional old left cerebellar hemisphere infarct and old left basal ganglia lacunar infarct.  3.  Mild atrophy and mild chronic small vessel white matter ischemic changes in both cerebral hemispheres.  These results were called by telephone on 11/16/2011 at 2223 to [facility], the patient's nurse, who verbally acknowledged these results.   Original Report Authenticated By: Darrol Angel, M.D.    Mr Brain Wo Contrast  11/17/2011  *RADIOLOGY REPORT*  Clinical Data:  Mental status changes.  Disoriented.  Fall. Abnormal head CT.  MRI HEAD WITHOUT CONTRAST MRA HEAD WITHOUT CONTRAST  Technique:  Multiplanar, multiecho pulse sequences of the brain and surrounding structures were obtained without intravenous contrast. Angiographic images of the head were obtained using MRA technique without contrast.  Comparison:  Head CT 11/16/2011.  MRI 02/14/2008.  MRI HEAD  Findings:  Diffusion imaging does not show any acute or subacute infarction.  There are mild chronic small vessel changes of the pons.  There are old infarctions affecting the inferior cerebellum on the left.  No acute cerebellar infarction.  The cerebral hemispheres show mild chronic small vessel change throughout the deep and subcortical white matter, fairly typical of a age.  No cortical or large vessel territory infarction.  No mass lesion, hemorrhage, hydrocephalus or extra-axial collection.  No pituitary mass.  No inflammatory sinus disease.  No skull or skull base lesion.  IMPRESSION: No acute or subacute infarction.  Old inferior cerebellar infarction on the left.  Chronic small vessel changes of the pons in the cerebral hemispheric white matter, fairly typical for age.  MRA HEAD  Findings: Both internal carotid arteries are widely patent into the brain.  No siphon stenosis.  The anterior and middle cerebral  vessels are patent without proximal stenosis, aneurysm or vascular malformation.  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  Both vertebral arteries are patent at the foramen  magnum level. The right is dominant.  Both vessels are patent to the basilar. The basilar artery shows moderate atherosclerotic irregularity with stenosis of 40% in the proximal third.  Superior cerebellar and posterior cerebral vessels show flow, with the left PCA receiving most of its supply from the anterior circulation.  IMPRESSION: No major vessel occlusion.  No significant anterior circulation stenotic disease.  40% stenosis of the basilar artery in the proximal third.  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   Original Report Authenticated By: Thomasenia Sales, M.D.    Mr Mra Head/brain Wo Cm  11/17/2011  *RADIOLOGY REPORT*  Clinical Data:  Mental status changes.  Disoriented.  Fall. Abnormal head CT.  MRI HEAD WITHOUT CONTRAST MRA HEAD WITHOUT CONTRAST  Technique:  Multiplanar, multiecho pulse sequences of the brain and surrounding structures were obtained without intravenous contrast. Angiographic images of the head were obtained using MRA technique without contrast.  Comparison:  Head CT 11/16/2011.  MRI 02/14/2008.  MRI HEAD  Findings:  Diffusion imaging does not show any acute or subacute infarction.  There are mild chronic small vessel changes of the pons.  There are old infarctions affecting the inferior cerebellum on the left.  No acute cerebellar infarction.  The cerebral hemispheres show mild chronic small vessel change throughout the deep and subcortical white matter, fairly typical of a age.  No cortical or large vessel territory infarction.  No mass lesion, hemorrhage, hydrocephalus or extra-axial collection.  No pituitary mass.  No inflammatory sinus disease.  No skull or skull base lesion.  IMPRESSION: No acute or subacute infarction.  Old inferior cerebellar infarction on the left.  Chronic small vessel changes of the pons in the cerebral hemispheric white matter, fairly typical for age.  MRA HEAD  Findings: Both internal carotid arteries are widely  patent into the brain.  No siphon stenosis.  The anterior and middle cerebral vessels are patent without proximal stenosis, aneurysm or vascular malformation.  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  Both vertebral arteries are patent at the foramen magnum level. The right is dominant.  Both vessels are patent to the basilar. The basilar artery shows moderate atherosclerotic irregularity with stenosis of 40% in the proximal third.  Superior cerebellar and posterior cerebral vessels show flow, with the left PCA receiving most of its supply from the anterior circulation.  IMPRESSION: No major vessel occlusion.  No significant anterior circulation stenotic disease.  40% stenosis of the basilar artery in the proximal third.  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   Original Report Authenticated By: Thomasenia Sales, M.D.     Scheduled Meds:   . aspirin  300 mg Rectal Daily   Or  . aspirin  325 mg Oral Daily  . enoxaparin  40 mg Subcutaneous Daily  . influenza  inactive virus vaccine  0.5 mL Intramuscular Tomorrow-1000  . ondansetron      . pneumococcal 23 valent vaccine  0.5 mL Intramuscular Tomorrow-1000      Yuritza Paulhus V.  [facility] Pager [phone]. If 8PM-8AM, please contact night-coverage at [email or web address], password [facility] 11/17/2011, 10:38 AM  LOS: 1 day

## 2011-11-17 NOTE — Plan of Care (Signed)
Problem: Phase I Progression Outcomes Goal: Bedrest with HOB 0-30 degrees Outcome: Completed/Met Date Met:  11/17/11 Patient on bedrest last evening until this am.

## 2011-11-17 NOTE — ED Provider Notes (Signed)
History     CSN: [id number]  Arrival date & time 11/16/11  2059   First MD Initiated Contact with Patient 11/16/11 2225      Chief Complaint  Patient presents with  . Headache    (Consider location/radiation/quality/duration/timing/severity/associated sxs/prior treatment) Patient is a 78 y.o. female presenting with headaches and altered mental status. The history is provided by the patient.  Headache  Pertinent negatives include no fever, no shortness of breath, no nausea and no vomiting.  Altered Mental Status This is a new problem. The current episode started today. The problem occurs constantly. The problem has been unchanged. Associated symptoms include abdominal pain, fatigue and headaches. Pertinent negatives include no chest pain, coughing, fever, nausea or vomiting. Nothing aggravates the symptoms. She has tried nothing for the symptoms. The treatment provided no relief.    Past Medical History  Diagnosis Date  . Hypertension   . Hyperlipidemia   . Osteopenia   . Diverticulosis   . DDD (degenerative disc disease)   . Chronic pain   . Stroke   . Syncope     Past Surgical History  Procedure Date  . Abdominal hysterectomy   . Tonsillectomy   . Exploratory laparotomy     2 times both neg   . Spine surgery     l3-l4 microdiscectomy   . Laminectomy 06-2003    l2-l4  . Doppler echocardiography     normal  . Carotid doppler     normal    Family History  Problem Relation Age of Onset  . Hypertension Father     History  Substance Use Topics  . Smoking status: Never Smoker   . Smokeless tobacco: Never Used  . Alcohol Use: No    OB History    Grav Para Term Preterm Abortions TAB SAB Ect Mult Living                  Review of Systems  Constitutional: Positive for fatigue. Negative for fever.  Respiratory: Negative for cough and shortness of breath.   Cardiovascular: Negative for chest pain.  Gastrointestinal: Positive for abdominal pain. Negative for  nausea, vomiting and diarrhea.  Neurological: Positive for headaches.  Psychiatric/Behavioral: Positive for altered mental status.  All other systems reviewed and are negative.    Allergies  Celecoxib; Codeine; and Etodolac  Home Medications   Current Outpatient Rx  Name Route Sig Dispense Refill  . ASPIRIN 325 MG PO TABS Oral Take 325 mg by mouth daily.      . TRAMADOL HCL 50 MG PO TABS Oral Take 50 mg by mouth every 6 (six) hours as needed. For back pain      BP 197/99  Pulse 60  Temp 99.3 F (37.4 C) (Oral)  Resp 19  SpO2 95%  Physical Exam  Nursing note and vitals reviewed. Constitutional: She is oriented to person, place, and time. She appears well-developed and well-nourished. No distress.  HENT:  Head: Normocephalic and atraumatic.  Eyes: EOM are normal. Pupils are equal, round, and reactive to light.  Neck: Normal range of motion.  Cardiovascular: Normal rate and normal heart sounds.   Pulmonary/Chest: Effort normal and breath sounds normal. No respiratory distress.  Abdominal: Soft. She exhibits no distension. There is no tenderness.  Musculoskeletal: Normal range of motion.  Neurological: She is alert and oriented to person, place, and time. She has normal strength. No cranial nerve deficit or sensory deficit. Coordination abnormal. GCS eye subscore is 4. GCS verbal subscore is  5. GCS motor subscore is 6.       Tremor noted in both hands; patient very drowsy but easily arousable  Skin: Skin is warm and dry.    ED Course  Procedures (including critical care time)  Labs Reviewed  CBC WITH DIFFERENTIAL - Abnormal; Notable for the following:    Neutrophils Relative 86 (*)     Lymphocytes Relative 10 (*)     All other components within normal limits  COMPREHENSIVE METABOLIC PANEL - Abnormal; Notable for the following:    Glucose, Bld 136 (*)     Calcium 11.2 (*)     GFR calc non Af Amer 58 (*)     GFR calc Af Amer 68 (*)     All other components within  normal limits  URINALYSIS, ROUTINE W REFLEX MICROSCOPIC - Abnormal; Notable for the following:    APPearance TURBID (*)     Ketones, ur 15 (*)     Protein, ur 100 (*)     All other components within normal limits  URINE MICROSCOPIC-ADD ON  GRAM STAIN  URINE CULTURE   Ct Head Wo Contrast  11/16/2011  *RADIOLOGY REPORT*  Clinical Data: Headache and vomiting.  CT HEAD WITHOUT CONTRAST  Technique:  Contiguous axial images were obtained from the base of the skull through the vertex without contrast.  Comparison: Brain MR dated 02/14/2008 and head CT dated 02/13/2008.  Findings: Oval area of ill-defined low density in the inferior left cerebellar hemisphere.  More inferiorly, an old infarct is noted in the left inferior cerebellar hemisphere. There is there is also a small oval area of decreased density in the inferior left basal ganglia which was lower in density in better defined previously, compatible with old lacunar infarct.  Mild patchy white matter low density in both cerebral hemispheres.  Mildly enlarged ventricles and subarachnoid spaces.  No intracranial hemorrhage or mass lesion.  Unremarkable bones and included paranasal sinuses.  IMPRESSION:  1.  Probable subacute left cerebellar hemisphere infarct. 2.  Additional old left cerebellar hemisphere infarct and old left basal ganglia lacunar infarct. 3.  Mild atrophy and mild chronic small vessel white matter ischemic changes in both cerebral hemispheres.  These results were called by telephone on 11/16/2011 at 2223 to [facility], the patient's nurse, who verbally acknowledged these results.   Original Report Authenticated By: Darrol Angel, M.D.    Date: 11/16/2011  Rate: 69  Rhythm: normal sinus rhythm  QRS Axis: normal  Intervals: normal  ST/T Wave abnormalities: normal  Conduction Disutrbances:none  Narrative Interpretation:   Old EKG Reviewed: unchanged     1. CVA (cerebral infarction)       MDM  Pt seen and examined. Family called  911 today due to patient not acting normally. She was sitting naked in the house. Concern for CVA v UTI v unknown. No focal deficit beyond tremor and inability to do finger-to-nose test. Will get CT scan as soon as possible as well as labs and urine.  Pt found to have new cerebellar infarct. No evidence of UTI.   12:23 AM Pt will be admitted to medicine for further observation and workup of this subacute stroke.        Daleen Bo, MD 11/17/11 [phone]

## 2011-11-17 NOTE — Evaluation (Signed)
Occupational Therapy Evaluation Patient Details Name: [name] MRN: [id number] DOB: 1930/03/06 Today's Date: 11/17/2011 Time: 4098-1191 OT Time Calculation (min): 38 min  OT Assessment / Plan / Recommendation Clinical Impression  Pt admitted with headache and altered mental status.CT shows Probable subacute left cerebellar hemisphere infarct.Stroke workup being completed. Will benefit from acute OT services to address below problem list. Recommending CIR to further maximize independence and safety with ADLs otherwise pt will need SNF.    OT Assessment  Patient needs continued OT Services    Follow Up Recommendations  Inpatient Rehab    Barriers to Discharge      Equipment Recommendations   (TBD pending pt progress)    Recommendations for Other Services Rehab consult;Speech consult  Frequency  Min 3X/week    Precautions / Restrictions Precautions Precautions: Fall Restrictions Weight Bearing Restrictions: No   Pertinent Vitals/Pain See vitals    ADL  Eating/Feeding: Performed;Minimal assistance Where Assessed - Eating/Feeding: Chair Upper Body Dressing: Performed;Minimal assistance Where Assessed - Upper Body Dressing: Unsupported sitting Toilet Transfer: Simulated;Minimal assistance Toilet Transfer Method: Sit to stand Toilet Transfer Equipment:  (ambulated from bed to recliner) Equipment Used: Gait belt Transfers/Ambulation Related to ADLs: Min assist with max verbal and manual cues to LLE for movement. ADL Comments: Pt requires max verbal and tactile cueing to attent to left side.  Pt unable to locate objects on left side of food tray without cueing and frequently placing left hand in food when attending to right side. Educated pt's family on cueing pt to scan to her left side to locate objects as well as sitting on her left during conversation to encourage increased attention.    OT Diagnosis: Acute pain;Cognitive deficits;Disturbance of vision  OT Problem List:  Decreased activity tolerance;Impaired balance (sitting and/or standing);Impaired vision/perception;Decreased cognition;Decreased coordination;Decreased safety awareness;Pain OT Treatment Interventions: Self-care/ADL training;Therapeutic activities;Cognitive remediation/compensation;Visual/perceptual remediation/compensation;Patient/family education;Balance training   OT Goals Acute Rehab OT Goals OT Goal Formulation: With patient/family Time For Goal Achievement: 12/01/11 Potential to Achieve Goals: Good ADL Goals Pt Will Perform Eating: with supervision;Sitting, chair;Sitting, edge of bed (min verbal cueing to locate objects on tray) ADL Goal: Eating - Progress: Goal set today Pt Will Perform Grooming: with supervision;Standing at sink (grooming objects on left side) ADL Goal: Grooming - Progress: Goal set today Pt Will Perform Upper Body Bathing: with supervision;Sitting, chair;Sitting, edge of bed ADL Goal: Upper Body Bathing - Progress: Goal set today Pt Will Perform Lower Body Bathing: with supervision;Sit to stand from chair;Sit to stand from bed ADL Goal: Lower Body Bathing - Progress: Goal set today Pt Will Perform Upper Body Dressing: with supervision;Sitting, bed;Sitting, chair ADL Goal: Upper Body Dressing - Progress: Goal set today Pt Will Perform Lower Body Dressing: with supervision;Sit to stand from chair;Sit to stand from bed ADL Goal: Lower Body Dressing - Progress: Goal set today Pt Will Transfer to Toilet: with supervision;Ambulation;Comfort height toilet;Regular height toilet ADL Goal: Toilet Transfer - Progress: Goal set today Miscellaneous OT Goals Miscellaneous OT Goal #1: Pt will attend to left side 75% of time during ADLs with min verbal cueing. OT Goal: Miscellaneous Goal #1 - Progress: Goal set today Miscellaneous OT Goal #2: Pt will demonstrate selective attention during ADL tasks with min verbal cueing to stay on task. OT Goal: Miscellaneous Goal #2 - Progress:  Goal set today  Visit Information  Last OT Received On: 11/17/11 Assistance Needed: +1    Subjective Data      Prior Functioning     Home  Living Lives With: Alone Available Help at Discharge: Family;Available 24 hours/day Type of Home: Mobile home Home Access: Stairs to enter Entrance Stairs-Number of Steps: 5 Entrance Stairs-Rails: Right;Left;Can reach both Home Layout: One level Bathroom Shower/Tub: Forensic scientist: Standard Bathroom Accessibility: Yes How Accessible: Accessible via walker Home Adaptive Equipment: Bedside commode/3-in-1 Additional Comments: Pt's family checks in physically at least 1x daily and multiple times on phone, Prior Function Level of Independence: Independent Able to Take Stairs?: Yes Driving: Yes Vocation: Retired Comments: housework, Restaurant manager, 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 Pt with left visual field deficit.  When asked to draw clock on paper, pt able to draw whole circle but on right side of paper and all numbers (incorrect/illegible numbers) on right side of clock. Praxis Praxis: Impaired Praxis Impairment Details: Motor planning Praxis-Other Comments: Possible motor planning in LUE.   Cognition  Overall Cognitive Status: Impaired Area of Impairment: Attention;Memory;Following commands;Safety/judgement;Awareness of errors;Problem solving;Awareness of deficits Arousal/Alertness: Awake/alert Orientation Level: Appears intact for tasks assessed Behavior During Session: [facility] for tasks performed Current Attention Level:  Sustained Following Commands: Follows multi-step commands inconsistently;Follows multi-step commands with increased time Safety/Judgement: Decreased safety judgement for tasks assessed;Decreased awareness of need for assistance Awareness of Errors: Assistance required to identify errors made;Assistance required to correct errors made Problem Solving: max cueing for problem solving Cognition - Other Comments: Left sided neglect    Extremity/Trunk Assessment Right Upper Extremity Assessment RUE ROM/Strength/Tone: Within functional levels Left Upper Extremity Assessment LUE ROM/Strength/Tone: WFL for tasks assessed;Unable to fully assess;Due to impaired cognition LUE Sensation: WFL - Light Touch LUE Coordination: Deficits LUE Coordination Deficits: Unable to accurately perform gross movements between nose and therapist hand. Increased time. Right Lower Extremity Assessment RLE ROM/Strength/Tone: Within functional levels RLE Sensation: WFL - Light Touch RLE Coordination: WFL - gross/fine motor Left Lower Extremity Assessment LLE ROM/Strength/Tone: Unable to fully assess;Due to impaired cognition (due to left neglect) LLE Sensation: WFL - Light Touch LLE Coordination: Deficits LLE Coordination Deficits: unable to complete heel-shin test. Able to complete on RLE. Pt unaware      Mobility Bed Mobility Bed Mobility: Supine to Sit;Sitting - Scoot to Edge of Bed Supine to Sit: 4: Min guard;With rails Sitting - Scoot to Edge of Bed: 4: Min guard Details for Bed Mobility Assistance: Pt able to complete with only minguard assist although requires increased cueing for sequencing secondary to pt with difficulty following instructions in an efficient manner Transfers Sit to Stand: 4: Min assist;With upper extremity assist;From bed Stand to Sit: 4: Min assist;With upper extremity assist;To chair/3-in-1 Details for Transfer Assistance: Min assist for stability with cueing for hand placement. Pt  required increased cueing for sequencing with increased time to comprehend what was being asked     Shoulder Instructions     Exercise     Balance Balance Balance Assessed: Yes Static Sitting Balance Static Sitting - Balance Support: Feet supported Static Sitting - Level of Assistance: 5: Stand by assistance Static Sitting - Comment/# of Minutes: ~10 min   End of Session OT - End of Session Equipment Utilized During Treatment: Gait belt Activity Tolerance: Patient tolerated treatment well Patient left: in chair;with call bell/phone within  reach;with family/visitor present Nurse Communication: Mobility status (left side neglect)  GO    11/17/2011 Cipriano Mile OTR/L Pager [phone] Office [phone]  Cipriano Mile 11/17/2011, 1:44 PM

## 2011-11-17 NOTE — H&P (Signed)
[facility] History and Physical  ALENI ANDRUS AVW:[id number] DOB: 10-03-1930 DOA: 11/16/2011  Referring physician:  PCP: Roxy Manns, MD  Specialists:   Chief Complaint: Disoriented  HPI: [name] is a 78 y.o. female who was last seen normal at 4:30 pm, and at 5.00 pm was found laying on the floor naked by her family.  She was disoriented and appeared to be trying to go to the bathroom and had fallen.  She lives alone and is very independent at baseline per her family.  EMS was called and she arrived at the ED at 9:00 pm.  In the ED, on examination she was found to have difficulty with the cerebellar testing, and a CT scan was done a revealed a subacute cerebellar infarction.    Review of Systems: The patient denies anorexia, fever, weight loss, vision loss, decreased hearing, hoarseness, chest pain, syncope, dyspnea on exertion, peripheral edema, balance deficits, hemoptysis, abdominal pain, melena, hematochezia, severe indigestion/heartburn, hematuria, incontinence, genital sores, muscle weakness, suspicious skin lesions, transient blindness, difficulty walking, depression, unusual weight change, abnormal bleeding, enlarged lymph nodes, angioedema, and breast masses.    Past Medical History  Diagnosis Date  . Hypertension   . Hyperlipidemia   . Osteopenia   . Diverticulosis   . DDD (degenerative disc disease)   . Chronic pain   . Stroke   . Syncope    Past Surgical History  Procedure Date  . Abdominal hysterectomy   . Tonsillectomy   . Exploratory laparotomy     2 times both neg   . Spine surgery     l3-l4 microdiscectomy   . Laminectomy 06-2003    l2-l4  . Doppler echocardiography     normal  . Carotid doppler     normal     Medications:  HOME MEDS: Prior to Admission medications   Medication Sig Start Date End Date Taking? Authorizing Provider  aspirin 325 MG tablet Take 325 mg by mouth daily.     Yes Historical Provider, MD  traMADol (ULTRAM) 50 MG  tablet Take 50 mg by mouth every 6 (six) hours as needed. For back pain   Yes Historical Provider, MD     Allergies  Allergen Reactions  . Celecoxib     REACTION: affects kidneys  . Codeine Nausea Only    And dizziness  . Etodolac     REACTION: Rash     Social History:   reports that she has never smoked. She has never used smokeless tobacco. She reports that she does not drink alcohol or use illicit drugs.    Family History: Family History  Problem Relation Age of Onset  . Hypertension Father   . COPD Father       Physical Exam:  GEN:  Pleasant 78 year old thin well nourished well developed examined  and in no acute distress; cooperative with exam Filed Vitals:   11/16/11 2230 11/16/11 2352 11/17/11 0030 11/17/11 0100  BP: 197/99  172/98 166/84  Pulse: 60  86 89  Temp:  99.3 F (37.4 C)    TempSrc:      Resp: 19  18 18   SpO2: 95%  96% 95%   Blood pressure 166/84, pulse 89, temperature 99.3 F (37.4 C), temperature source Oral, resp. rate 18, SpO2 95.00%. PSYCH: She is alert and oriented x4; does not appear anxious does not appear depressed; affect is normal HEENT: Normocephalic and Atraumatic, Mucous membranes pink; PERRLA; EOM intact; Fundi:  Benign;  No scleral  icterus, Nares: Patent, Oropharynx: Clear, Fair Dentition, Neck:  FROM, no cervical lymphadenopathy nor thyromegaly or carotid bruit; no JVD; Breasts:: Not examined CHEST WALL: No tenderness CHEST: Normal respiration, clear to auscultation bilaterally HEART: Regular rate and rhythm; no murmurs rubs or gallops BACK: No kyphosis or scoliosis; no CVA tenderness ABDOMEN: Positive Bowel Sounds, soft non-tender; no masses, no organomegaly. Rectal Exam: Not done EXTREMITIES: No bone or joint deformity; age-appropriate arthropathy of the hands and knees; no cyanosis, clubbing or edema; no ulcerations. Genitalia: not examined PULSES: 2+ and symmetric SKIN: Normal hydration no rash or ulceration CNS: Cranial  nerves 2-12,  Except for Decreased Hearing (Cronic),  and  trouble with Cerebellar maneuvers,  Otherwise no focal neurologic deficit    Labs on Admission:  Basic Metabolic Panel:  Lab 11/16/11 1610  NA 135  K 3.9  CL 99  CO2 25  GLUCOSE 136*  BUN 14  CREATININE 0.91  CALCIUM 11.2*  MG --  PHOS --   Liver Function Tests:  Lab 11/16/11 2137  AST 19  ALT 12  ALKPHOS 77  BILITOT 0.4  PROT 7.4  ALBUMIN 3.9   No results found for this basename: LIPASE:5,AMYLASE:5 in the last 168 hours No results found for this basename: AMMONIA:5 in the last 168 hours CBC:  Lab 11/16/11 2137  WBC 8.2  NEUTROABS 7.0  HGB 12.9  HCT 36.7  MCV 93.6  PLT 201   Cardiac Enzymes: No results found for this basename: CKTOTAL:5,CKMB:5,CKMBINDEX:5,TROPONINI:5 in the last 168 hours  BNP (last 3 results) No results found for this basename: PROBNP:3 in the last 8760 hours CBG: No results found for this basename: GLUCAP:5 in the last 168 hours  Radiological Exams on Admission: Ct Head Wo Contrast  11/16/2011  *RADIOLOGY REPORT*  Clinical Data: Headache and vomiting.  CT HEAD WITHOUT CONTRAST  Technique:  Contiguous axial images were obtained from the base of the skull through the vertex without contrast.  Comparison: Brain MR dated 02/14/2008 and head CT dated 02/13/2008.  Findings: Oval area of ill-defined low density in the inferior left cerebellar hemisphere.  More inferiorly, an old infarct is noted in the left inferior cerebellar hemisphere. There is there is also a small oval area of decreased density in the inferior left basal ganglia which was lower in density in better defined previously, compatible with old lacunar infarct.  Mild patchy white matter low density in both cerebral hemispheres.  Mildly enlarged ventricles and subarachnoid spaces.  No intracranial hemorrhage or mass lesion.  Unremarkable bones and included paranasal sinuses.  IMPRESSION:  1.  Probable subacute left cerebellar  hemisphere infarct. 2.  Additional old left cerebellar hemisphere infarct and old left basal ganglia lacunar infarct. 3.  Mild atrophy and mild chronic small vessel white matter ischemic changes in both cerebral hemispheres.  These results were called by telephone on 11/16/2011 at 2223 to [facility], the patient's nurse, who verbally acknowledged these results.   Original Report Authenticated By: Darrol Angel, M.D.     EKG: Independently reviewed. Sinus Rhythm  Assessment Active Problems:  CVA  HYPERLIPIDEMIA  HYPERTENSION    Plan:   Admit to Telemetry Bed CVA workup per CVA Protocol MRI/MRA and Carotid US in AM PRN IV Hydralazine for Elevated Blood Pressures Reconcile Home Medications DVT prophylaxis    Code Status:  (FULL CODE) Family Communication:  (Family at Bedside, Neices) Disposition Plan:  (Return to Home)  Time spent: 60 Minutes  Ron Parker [facility] Pager [phone]  If 7PM-7AM,  please contact night-coverage [email or web address] Password [email or web address] 11/17/2011, 1:53 AM

## 2011-11-17 NOTE — Evaluation (Signed)
Physical Therapy Evaluation Patient Details Name: [name] MRN: [id number] DOB: 11-10-1930 Today's Date: 11/17/2011 Time: 1123-1200 PT Time Calculation (min): 37 min  PT Assessment / Plan / Recommendation Clinical Impression  Pt admitted s/p fall with L sided weakness. CT shows cerebellar CVA. Pt with left neglect, decreased coordination and decreased safety awareness. Pt will benefit from skilled PT in the acute care setting in order to maximize functional mobility, safety and neuromuscular deficits for further improvements prior to d/c home. Spoke with pt and family regarding need for increased therapy and a short term rehab stay prior to d/c. Pt would make an excellent inpatient rehab candidate.     PT Assessment  Patient needs continued PT services    Follow Up Recommendations  Post acute inpatient;Supervision/Assistance - 24 hour    Does the patient have the potential to tolerate intense rehabilitation   Yes, Recommend IP Rehab Screening  Barriers to Discharge Decreased caregiver support lives alone, family can be available 24/7 if necessary.     Equipment Recommendations  Rolling walker with 5" wheels    Recommendations for Other Services Rehab consult   Frequency Min 4X/week    Precautions / Restrictions Precautions Precautions: Fall Restrictions Weight Bearing Restrictions: No   Pertinent Vitals/Pain Complaints of headaches; 6/10. RN aware.       Mobility  Bed Mobility Bed Mobility: Supine to Sit;Sitting - Scoot to Edge of Bed Supine to Sit: 4: Min guard;With rails Sitting - Scoot to Edge of Bed: 4: Min guard Details for Bed Mobility Assistance: Pt able to complete with only minguard assist although requires increased cueing for sequencing secondary to pt with difficulty following instructions in an efficient manner Transfers Transfers: Sit to Stand;Stand to Sit;Stand Pivot Transfers Sit to Stand: 4: Min assist;With upper extremity assist;From bed Stand to  Sit: 4: Min assist;With upper extremity assist;To chair/3-in-1 Details for Transfer Assistance: Min assist for stability with cueing for hand placement. Pt required increased cueing for sequencing with increased time to comprehend what was being asked Ambulation/Gait Ambulation/Gait Assistance: 4: Min assist Ambulation Distance (Feet): 5 Feet Assistive device: 1 person hand held assist Ambulation/Gait Assistance Details: Pt able to complete marching in place when asked, although only marching right leg. During ambulation, increased cueing for reminders to bring left leg forward as well. Decreased control of LLE for placement as well as while turning.  Gait Pattern: Step-to pattern;Ataxic;Narrow base of support;Trunk flexed Gait velocity: decreased gait speed Modified Rankin (Stroke Patients Only) Pre-Morbid Rankin Score: No symptoms Modified Rankin: Moderately severe disability    Shoulder Instructions     Exercises     PT Diagnosis: Difficulty walking;Altered mental status;Abnormality of gait  PT Problem List: Decreased strength;Decreased activity tolerance;Decreased balance;Decreased mobility;Decreased coordination;Decreased cognition;Decreased knowledge of use of DME;Decreased safety awareness;Decreased knowledge of precautions;Pain PT Treatment Interventions: DME instruction;Gait training;Functional mobility training;Stair training;Therapeutic activities;Balance training;Cognitive remediation;Patient/family education   PT Goals Acute Rehab PT Goals PT Goal Formulation: With patient/family Time For Goal Achievement: 11/24/11 Potential to Achieve Goals: Fair Pt will go Supine/Side to Sit: with modified independence PT Goal: Supine/Side to Sit - Progress: Goal set today Pt will go Sit to Supine/Side: with modified independence PT Goal: Sit to Supine/Side - Progress: Goal set today Pt will go Sit to Stand: with supervision PT Goal: Sit to Stand - Progress: Goal set today Pt will go  Stand to Sit: with supervision PT Goal: Stand to Sit - Progress: Goal set today Pt will Transfer Bed to Chair/Chair to Bed: with  supervision PT Transfer Goal: Bed to Chair/Chair to Bed - Progress: Goal set today Pt will Ambulate: 51 - 150 feet;with supervision;with least restrictive assistive device PT Goal: Ambulate - Progress: Goal set today  Visit Information  Last PT Received On: 11/17/11 Assistance Needed: +1 PT/OT Co-Evaluation/Treatment: Yes    Subjective Data  Patient Stated Goal: none stated   Prior Functioning  Home Living Lives With: Alone Available Help at Discharge: Family;Available 24 hours/day Type of Home: Mobile home Home Access: Stairs to enter Entrance Stairs-Number of Steps: 5 Entrance Stairs-Rails: Right;Left;Can reach both Home Layout: One level Bathroom Shower/Tub: Forensic scientist: Standard Bathroom Accessibility: Yes How Accessible: Accessible via walker Home Adaptive Equipment: Bedside commode/3-in-1 Additional Comments: Pt's family checks in physically at least 1x daily and multiple times on phone, Prior Function Level of Independence: Independent Able to Take Stairs?: Yes Driving: Yes Vocation: Retired Comments: housework, Restaurant manager, 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 Dominant Hand: Right    Cognition  Overall Cognitive Status: Impaired Area of Impairment: Attention;Memory;Following commands;Safety/judgement;Awareness of errors;Problem solving;Awareness of deficits Arousal/Alertness: Awake/alert Orientation Level: Appears intact for tasks assessed Behavior During Session: [facility] for tasks performed Current Attention Level: Sustained Following Commands: Follows multi-step commands with increased time Safety/Judgement: Decreased safety judgement for tasks assessed;Decreased awareness of need for assistance Awareness of Errors: Assistance required to identify errors made;Assistance required to correct errors  made Problem Solving: max cueing for problem solving Cognition - Other Comments: Left sided neglect    Extremity/Trunk Assessment Right Lower Extremity Assessment RLE ROM/Strength/Tone: Within functional levels RLE Sensation: WFL - Light Touch RLE Coordination: WFL - gross/fine motor Left Lower Extremity Assessment LLE ROM/Strength/Tone: Unable to fully assess;Due to impaired cognition (due to left neglect) LLE Sensation: WFL - Light Touch LLE Coordination: Deficits LLE Coordination Deficits: unable to complete heel-shin test. Able to complete on RLE. Pt unaware    Balance    End of Session PT - End of Session Equipment Utilized During Treatment: Gait belt Activity Tolerance: Patient tolerated treatment well Patient left: in chair;with call bell/phone within reach;with family/visitor present Nurse Communication: Mobility status  GP     Milana Kidney 11/17/2011, 1:24 PM  11/17/2011 Milana Kidney DPT PAGER: [phone] OFFICE: [phone]

## 2011-11-17 NOTE — Plan of Care (Signed)
Problem: Phase I Progression Outcomes Goal: Pt admitted to Stroke Unit Outcome: Completed/Met Date Met:  11/17/11 Patient admitted to stroke unit on 11/16/11. Goal: Stroke Team notified of admission Outcome: Completed/Met Date Met:  11/17/11 Patient seen by stroke team in ER. Goal: Strict NPO til swallow screen done Outcome: Completed/Met Date Met:  11/17/11 Patient completed swallow study successfully in ER.  Diet ordered this am; heart healthy with no restrictions. Goal: NIH Stroke Scale-NIHSS done on admission Outcome: Completed/Met Date Met:  11/17/11 NIH done on admission and each shift after.

## 2011-11-17 NOTE — Plan of Care (Signed)
Problem: Phase II Progression Outcomes Goal: Able to communicate Outcome: Progressing Patient able to speak clearly but very hard of hearing so unable to catch all of spoken conversations.

## 2011-11-17 NOTE — Progress Notes (Signed)
  Echocardiogram 2D Echocardiogram has been performed.  Gia Lusher FRANCES 11/17/2011, 4:10 PM

## 2011-11-17 NOTE — Progress Notes (Signed)
SLP Cancellation Note  Patient Details Name: [name] MRN: [id number] DOB: 18-Jan-1931   ST received order for SLE.  Evaluation referred to 11/18/11. Moreen Fowler MS, CCC-SLP [phone]       [facility] 11/17/2011, 4:31 PM

## 2011-11-17 NOTE — ED Provider Notes (Signed)
I saw and evaluated the patient, reviewed the resident's note and I agree with the findings and plan.  Found by family confused and naked about 1830. Last heard from normal 1630.  Presenting to ED at 2100.  CN 2-12 intact (decreased hearing), 5/5 strength throughout. Some ataxia on finger to nose.  Not tPA candidate given delayed presentation and improving symptoms.  Glynn Octave, MD 11/17/11 1610

## 2011-11-18 DIAGNOSIS — I6330 Cerebral infarction due to thrombosis of unspecified cerebral artery: Secondary | ICD-10-CM

## 2011-11-18 LAB — COMPREHENSIVE METABOLIC PANEL
AST: 24 U/L (ref 0–37)
BUN: 14 mg/dL (ref 6–23)
CO2: 25 mEq/L (ref 19–32)
Calcium: 10.8 mg/dL — ABNORMAL HIGH (ref 8.4–10.5)
Chloride: 102 mEq/L (ref 96–112)
Creatinine, Ser: 0.86 mg/dL (ref 0.50–1.10)
GFR calc Af Amer: 73 mL/min — ABNORMAL LOW (ref >90)
GFR calc non Af Amer: 63 mL/min — ABNORMAL LOW (ref >90)
Glucose, Bld: 109 mg/dL — ABNORMAL HIGH (ref 70–99)
Total Bilirubin: 0.5 mg/dL (ref 0.3–1.2)

## 2011-11-18 LAB — URINE CULTURE: Colony Count: NO GROWTH

## 2011-11-18 LAB — VITAMIN D 25 HYDROXY (VIT D DEFICIENCY, FRACTURES): Vit D, 25-Hydroxy: 53 ng/mL (ref 30–89)

## 2011-11-18 MED ORDER — LISINOPRIL 10 MG PO TABS: 10.0000 mg | ORAL_TABLET | Freq: Every day | ORAL | Status: DC

## 2011-11-18 MED ORDER — ATORVASTATIN CALCIUM 20 MG PO TABS
20.0000 mg | ORAL_TABLET | Freq: Every day | ORAL | Status: DC
  Administered 2011-11-18 – 2011-11-20 (×3): 20 mg via ORAL
  Filled 2011-11-18 (×4): qty 1

## 2011-11-18 MED ORDER — ATORVASTATIN CALCIUM 20 MG PO TABS: 20.0000 mg | ORAL_TABLET | Freq: Every day | ORAL | Status: DC

## 2011-11-18 MED ORDER — CLOPIDOGREL BISULFATE 75 MG PO TABS: 75.0000 mg | ORAL_TABLET | Freq: Every day | ORAL | Status: DC

## 2011-11-18 NOTE — Consult Note (Signed)
Physical Medicine and Rehabilitation Consult Reason for Consult: CVA Referring Physician: [facility]   HPI: [name] is a 78 y.o. right handed female with history of hypertension. Admitted 11/17/2011 with altered mental status found lying on the floor naked by her family She lives alone and was very independent prior to admission. MRI of the brain showed probable subacute left cerebellar hemispheric infarct as well as additional old left cerebellar hemispheric infarct and old left basal ganglia lacunar infarct. MRA of the head with no major vessel occlusion. Echocardiogram with ejection fraction 65% grade 1 diastolic dysfunction. Carotid Dopplers were pending. Placed on aspirin therapy as well as subcutaneous Lovenox for DVT prophylaxis. She is tolerating a regular consistency diet. Physical and occupational therapy evaluations completed an ongoing noting left-sided weakness with neglect as well as limited safety awareness and recommendations made for physical medicine rehabilitation consult to consider inpatient rehabilitation services.   Review of Systems  Gastrointestinal: Positive for constipation.  Musculoskeletal: Positive for myalgias.  All other systems reviewed and are negative.   Past Medical History  Diagnosis Date  . Hypertension   . Hyperlipidemia   . Osteopenia   . Diverticulosis   . DDD (degenerative disc disease)   . Chronic pain   . Stroke   . Syncope    Past Surgical History  Procedure Date  . Abdominal hysterectomy   . Tonsillectomy   . Exploratory laparotomy     2 times both neg   . Spine surgery     l3-l4 microdiscectomy   . Laminectomy 06-2003    l2-l4  . Doppler echocardiography     normal  . Carotid doppler     normal   Family History  Problem Relation Age of Onset  . Hypertension Father   . COPD Father    Social History:  reports that she has never smoked. She has never used smokeless tobacco. She reports that she does not drink alcohol or use  illicit drugs. Allergies:  Allergies  Allergen Reactions  . Celecoxib     REACTION: affects kidneys  . Codeine Nausea Only    And dizziness  . Etodolac     REACTION: Rash   Medications Prior to Admission  Medication Sig Dispense Refill  . aspirin 325 MG tablet Take 325 mg by mouth daily.        . traMADol (ULTRAM) 50 MG tablet Take 50 mg by mouth every 6 (six) hours as needed. For back pain        Home: Home Living Lives With: Alone Available Help at Discharge: Family;Available 24 hours/day Type of Home: Mobile home Home Access: Stairs to enter Entrance Stairs-Number of Steps: 5 Entrance Stairs-Rails: Right;Left;Can reach both Home Layout: One level Bathroom Shower/Tub: Forensic scientist: Standard Bathroom Accessibility: Yes How Accessible: Accessible via walker Home Adaptive Equipment: Bedside commode/3-in-1 Additional Comments: Pt's family checks in physically at least 1x daily and multiple times on phone,  Functional History: Prior Function Able to Take Stairs?: Yes Driving: Yes Vocation: Retired Comments: housework, gardening Functional Status:  Mobility: Bed Mobility Bed Mobility: Supine to Sit;Sitting - Scoot to Edge of Bed Supine to Sit: 4: Min guard;With rails Sitting - Scoot to Edge of Bed: 4: Min guard Transfers Transfers: Sit to Stand;Stand to [employer] Transfers Sit to Stand: 4: Min assist;With upper extremity assist;From bed Stand to Sit: 4: Min assist;With upper extremity assist;To chair/3-in-1 Ambulation/Gait Ambulation/Gait Assistance: 4: Min assist Ambulation Distance (Feet): 5 Feet Assistive device: 1 person hand held  assist Ambulation/Gait Assistance Details: Pt able to complete marching in place when asked, although only marching right leg. During ambulation, increased cueing for reminders to bring left leg forward as well. Decreased control of LLE for placement as well as while turning.  Gait Pattern: Step-to  pattern;Ataxic;Narrow base of support;Trunk flexed Gait velocity: decreased gait speed    ADL: ADL Eating/Feeding: Performed;Minimal assistance Where Assessed - Eating/Feeding: Chair Upper Body Dressing: Performed;Minimal assistance Where Assessed - Upper Body Dressing: Unsupported sitting Toilet Transfer: Simulated;Minimal assistance Toilet Transfer Method: Sit to stand Toilet Transfer Equipment:  (ambulated from bed to recliner) Equipment Used: Gait belt Transfers/Ambulation Related to ADLs: Min assist with max verbal and manual cues to LLE for movement. ADL Comments: Pt requires max verbal and tactile cueing to attent to left side.  Pt unable to locate objects on left side of food tray without cueing and frequently placing left hand in food when attending to right side. Educated pt's family on cueing pt to scan to her left side to locate objects as well as sitting on her left during conversation to encourage increased attention.  Cognition: Cognition Arousal/Alertness: Awake/alert Orientation Level: Oriented X4 Cognition Overall Cognitive Status: Impaired Area of Impairment: Attention;Memory;Following commands;Safety/judgement;Awareness of errors;Problem solving;Awareness of deficits Arousal/Alertness: Awake/alert Orientation Level: Appears intact for tasks assessed Behavior During Session: [facility] for tasks performed Current Attention Level: Sustained Following Commands: Follows multi-step commands inconsistently;Follows multi-step commands with increased time Safety/Judgement: Decreased safety judgement for tasks assessed;Decreased awareness of need for assistance Awareness of Errors: Assistance required to identify errors made;Assistance required to correct errors made Problem Solving: max cueing for problem solving Cognition - Other Comments: Left sided neglect  Blood pressure 164/91, pulse 84, temperature 99.4 F (37.4 C), temperature source Oral, resp. rate 18, height 5\' 7"   (1.702 m), weight 70.308 kg (155 lb), SpO2 95.00%. Physical Exam  Vitals reviewed. Constitutional: She appears well-developed.  HENT:  Head: Normocephalic.  Eyes:       Pupils round reactive to light  Neck: Neck supple. No thyromegaly present.  Cardiovascular: Normal rate and regular rhythm.   Pulmonary/Chest: Breath sounds normal. No respiratory distress. She has no wheezes.  Abdominal: Bowel sounds are normal. She exhibits no distension. There is no tenderness.  Musculoskeletal: She exhibits no edema.  Neurological: She is alert.       She was able to name place and date of birth but was slow to state her age and need to re\re correct. She followed three-step commands. She was a bit impulsive with limitations and safety awareness. Mild decrease in strength and fine coordination on the left. No frank nystagmus, mild decrease in FT and PP on the left leg. Speech at times was halting but thought processing was generally intact.  Skin: Skin is warm and dry.  Psychiatric: She has a normal mood and affect. Her behavior is normal. Judgment and thought content normal.    No results found for this or any previous visit (from the past 24 hour(s)). Dg Chest 2 View  11/17/2011  *RADIOLOGY REPORT*  Clinical Data: Altered mental status, stroke  CHEST - 2 VIEW  Comparison: 04/10/2007  Findings: Heart size upper limits normal.  Lungs clear.  No effusion.  Mild spurring in the lower thoracic spine.  IMPRESSION:  No acute disease   Original Report Authenticated By: Osa Craver, M.D.    Ct Head Wo Contrast  11/16/2011  *RADIOLOGY REPORT*  Clinical Data: Headache and vomiting.  CT HEAD WITHOUT CONTRAST  Technique:  Contiguous axial images were obtained from the base of the skull through the vertex without contrast.  Comparison: Brain MR dated 02/14/2008 and head CT dated 02/13/2008.  Findings: Oval area of ill-defined low density in the inferior left cerebellar hemisphere.  More inferiorly, an old  infarct is noted in the left inferior cerebellar hemisphere. There is there is also a small oval area of decreased density in the inferior left basal ganglia which was lower in density in better defined previously, compatible with old lacunar infarct.  Mild patchy white matter low density in both cerebral hemispheres.  Mildly enlarged ventricles and subarachnoid spaces.  No intracranial hemorrhage or mass lesion.  Unremarkable bones and included paranasal sinuses.  IMPRESSION:  1.  Probable subacute left cerebellar hemisphere infarct. 2.  Additional old left cerebellar hemisphere infarct and old left basal ganglia lacunar infarct. 3.  Mild atrophy and mild chronic small vessel white matter ischemic changes in both cerebral hemispheres.  These results were called by telephone on 11/16/2011 at 2223 to [employer], the patient's nurse, who verbally acknowledged these results.   Original Report Authenticated By: Darrol Angel, M.D.    Mr Brain Wo Contrast  11/17/2011  *RADIOLOGY REPORT*  Clinical Data:  Mental status changes.  Disoriented.  Fall. Abnormal head CT.  MRI HEAD WITHOUT CONTRAST MRA HEAD WITHOUT CONTRAST  Technique:  Multiplanar, multiecho pulse sequences of the brain and surrounding structures were obtained without intravenous contrast. Angiographic images of the head were obtained using MRA technique without contrast.  Comparison:  Head CT 11/16/2011.  MRI 02/14/2008.  MRI HEAD  Findings:  Diffusion imaging does not show any acute or subacute infarction.  There are mild chronic small vessel changes of the pons.  There are old infarctions affecting the inferior cerebellum on the left.  No acute cerebellar infarction.  The cerebral hemispheres show mild chronic small vessel change throughout the deep and subcortical white matter, fairly typical of a age.  No cortical or large vessel territory infarction.  No mass lesion, hemorrhage, hydrocephalus or extra-axial collection.  No pituitary mass.  No inflammatory  sinus disease.  No skull or skull base lesion.  IMPRESSION: No acute or subacute infarction.  Old inferior cerebellar infarction on the left.  Chronic small vessel changes of the pons in the cerebral hemispheric white matter, fairly typical for age.  MRA HEAD  Findings: Both internal carotid arteries are widely patent into the brain.  No siphon stenosis.  The anterior and middle cerebral vessels are patent without proximal stenosis, aneurysm or vascular malformation.  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  Both vertebral arteries are patent at the foramen magnum level. The right is dominant.  Both vessels are patent to the basilar. The basilar artery shows moderate atherosclerotic irregularity with stenosis of 40% in the proximal third.  Superior cerebellar and posterior cerebral vessels show flow, with the left PCA receiving most of its supply from the anterior circulation.  IMPRESSION: No major vessel occlusion.  No significant anterior circulation stenotic disease.  40% stenosis of the basilar artery in the proximal third.  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   Original Report Authenticated By: Thomasenia Sales, M.D.    Mr Mra Head/brain Wo Cm  11/17/2011  *RADIOLOGY REPORT*  Clinical Data:  Mental status changes.  Disoriented.  Fall. Abnormal head CT.  MRI HEAD WITHOUT CONTRAST MRA HEAD WITHOUT CONTRAST  Technique:  Multiplanar, multiecho pulse sequences of the brain and surrounding structures were obtained without intravenous contrast. Angiographic images of the head were obtained using MRA technique without contrast.  Comparison:  Head CT 11/16/2011.  MRI 02/14/2008.  MRI HEAD  Findings:  Diffusion imaging does not show any acute or subacute infarction.  There are mild chronic small vessel changes of the pons.  There are old infarctions affecting the inferior cerebellum on the left.  No  acute cerebellar infarction.  The cerebral hemispheres show mild chronic small vessel change throughout the deep and subcortical white matter, fairly typical of a age.  No cortical or large vessel territory infarction.  No mass lesion, hemorrhage, hydrocephalus or extra-axial collection.  No pituitary mass.  No inflammatory sinus disease.  No skull or skull base lesion.  IMPRESSION: No acute or subacute infarction.  Old inferior cerebellar infarction on the left.  Chronic small vessel changes of the pons in the cerebral hemispheric white matter, fairly typical for age.  MRA HEAD  Findings: Both internal carotid arteries are widely patent into the brain.  No siphon stenosis.  The anterior and middle cerebral vessels are patent without proximal stenosis, aneurysm or vascular malformation.  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  Both vertebral arteries are patent at the foramen magnum level. The right is dominant.  Both vessels are patent to the basilar. The basilar artery shows moderate atherosclerotic irregularity with stenosis of 40% in the proximal third.  Superior cerebellar and posterior cerebral vessels show flow, with the left PCA receiving most of its supply from the anterior circulation.  IMPRESSION: No major vessel occlusion.  No significant anterior circulation stenotic disease.  40% stenosis of the basilar artery in the proximal third.  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   Original Report Authenticated By: Thomasenia Sales, M.D.     Assessment/Plan: Diagnosis: left cerebellar infarct 1. Does the need for close, 24 hr/day medical supervision in concert with the patient's rehab needs make it unreasonable for this patient to be served in a less intensive setting? Yes 2. Co-Morbidities requiring supervision/potential complications: htn, chronic low back pain,  3. Due to bladder  management, bowel management, safety, skin/wound care, disease management, medication administration, pain management and patient education, does the patient require 24 hr/day rehab nursing? Yes 4. Does the patient require coordinated care of a physician, rehab nurse, PT (1-2 hrs/day, 5 days/week) and OT (1-2 hrs/day, 5 days/week) to address physical and functional deficits in the context of the above medical diagnosis(es)? Yes Addressing deficits in the following areas: balance, endurance, locomotion, strength, transferring, bowel/bladder control, bathing, dressing, feeding, grooming, toileting and psychosocial support 5. Can the patient actively participate in an intensive therapy program of at least 3 hrs of therapy per day at least 5 days per week? Yes 6. The potential for patient to make measurable gains while on inpatient rehab is excellent 7. Anticipated functional outcomes upon discharge from inpatient rehab are mod I to supervision with PT, mod I to supervision with OT, n/a with SLP. 8. Estimated rehab length of stay to reach the above functional goals is: one week 9. Does the patient have adequate social supports to accommodate these discharge functional goals? Yes and Potentially 10. Anticipated D/C setting: Home 11. Anticipated post D/C treatments: HH therapy 12. Overall Rehab/Functional Prognosis: excellent  RECOMMENDATIONS: This patient's condition is appropriate for continued rehabilitative care  in the following setting: CIR Patient has agreed to participate in recommended program. Yes and Potentially Note that insurance prior authorization may be required for reimbursement for recommended care.  Comment: Pt favors coming to rehab because she knows that it would benefit her and allow her to be as independent as possible when she returns home. Need to follow up on available social supports. Rehab RN to follow up.   Ivory Broad, MD     11/18/2011

## 2011-11-18 NOTE — Progress Notes (Signed)
VASCULAR LAB PRELIMINARY  PRELIMINARY  PRELIMINARY  PRELIMINARY  Carotid duplex completed.    Preliminary report:  Bilateral:  No evidence of hemodynamically significant internal carotid artery stenosis.   Vertebral artery flow is antegrade.     Kileen Lange, RVS 11/18/2011, 1:24 PM

## 2011-11-18 NOTE — Evaluation (Signed)
SLP has reviewed and agrees with student's note below.  Lalah Durango Willis Sofhia Ulibarri M.Ed CCC-SLP Pager [phone]  11/18/2011  

## 2011-11-18 NOTE — Evaluation (Signed)
Speech Language Pathology Evaluation Patient Details Name: [name] MRN: [id number] DOB: 01-Feb-1931 Today's Date: 11/18/2011 Time:  -     Problem List:  Patient Active Problem List  Diagnosis  . HYPERLIPIDEMIA  . HYPERTENSION  . CVA  . DIVERTICULOSIS, COLON  . HIP PAIN  . OSTEOPENIA  . PELVIC  PAIN  . URINALYSIS, ABNORMAL  . Syncope  . RLQ abdominal pain  . Renal insufficiency  . Low TSH level  . Suprapubic abdominal pain  . Back pain, thoracic  . Scoliosis  . Degenerative disc disease   Past Medical History:  Past Medical History  Diagnosis Date  . Hypertension   . Hyperlipidemia   . Osteopenia   . Diverticulosis   . DDD (degenerative disc disease)   . Chronic pain   . Stroke   . Syncope    Past Surgical History:  Past Surgical History  Procedure Date  . Abdominal hysterectomy   . Tonsillectomy   . Exploratory laparotomy     2 times both neg   . Spine surgery     l3-l4 microdiscectomy   . Laminectomy 06-2003    l2-l4  . Doppler echocardiography     normal  . Carotid doppler     normal   HPI:  Pt. is 78 year old female with h/x of HTN, CVA, syncope, and diverticulosis.  Admitted to [facility] with suspected CVA. CT showed probable subacute cerebellar infarct. MRI showed no acute/subacute infarct but old inferior left cerebellar infarct present. Pt. is retired and lives alone.    Assessment / Plan / Recommendation Clinical Impression  Pt. presents with severe intellectual/cognitive deficits in the areas of attention, memory, and problem solving. Upon entering room, Pt. was highly confused, pouring coffee into glass case and unable to determine what to do with other items on her table. RN reports she has been in this confused mental state since arrival. Despite assurances from SLP and RN that her coffee was in front of her, Pt. continued to request for her coffee. During assessment, Pt. was able to attend to SLP for approximately 30-40 seconds. Max verbal prompts  required for Pt. to answer most questions. Pt. is able to recall personal information such as date of birth and work history with no outward difficulty. However, unable to recall recent events or store new information during assessment. Problem solving marked by decreased anticipatory awareness and decreased ability to answer follow up questions. Though Pt. reports her brother lives down the road, and is available intermittently, question Pt.'s ability to safely live independently after discharge due to increased confusion and altered mental state. Given assessment findings and clinical observations at bedside, SLP recommends cognitive therapy focusing on implementing compensatory strategies for memory deficits, increasing attention, and improving problem solving abilities.    SLP Assessment  Patient needs continued Speech Lanaguage Pathology Services    Follow Up Recommendations  Home health SLP    Frequency and Duration min 2x/week  2 weeks      SLP Goals  SLP Goals Potential to Achieve Goals: Fair Potential Considerations: Ability to learn/carryover information;Family/community support;Previous level of function Progress/Goals/Alternative treatment plan discussed with pt/caregiver and they: Agree SLP Goal #1: Pt. will attend to cognitive tasks for 1 minute with moderate verbal cues from clinician.  SLP Goal #2: Pt. will recall information about diagnosis and other relevant details with moderate verbal cues SLP Goal #3: Pt. will increase anticipatory awareness through functional problem solving tasks with moderate verbal cues.   SLP  Evaluation Prior Functioning  Cognitive/Linguistic Baseline: Baseline deficits Type of Home: Mobile home Lives With: Alone Available Help at Discharge: Family;Available PRN/intermittently Vocation: Retired   IT consultant  Overall Cognitive Status: Impaired at baseline Arousal/Alertness: Awake/alert Orientation Level: Oriented to person;Oriented to  place;Oriented to time Attention: Divided (decreaed attention) Divided Attention: Impaired Divided Attention Impairment: Verbal basic;Functional basic Memory: Impaired Memory Impairment: Storage deficit;Retrieval deficit;Decreased recall of new information;Prospective memory Awareness: Impaired Awareness Impairment: Anticipatory impairment;Emergent impairment;Intellectual impairment Problem Solving: Impaired Problem Solving Impairment: Verbal basic;Functional basic Executive Function: Reasoning;Sequencing;Organizing;Decision Making;Initiating;Self Monitoring;Self Correcting Safety/Judgment: Impaired    Comprehension  Auditory Comprehension Overall Auditory Comprehension: Appears within functional limits for tasks assessed Yes/No Questions: Not tested Commands: Within Functional Limits Conversation: Simple Interfering Components: Attention;Processing speed;Working Theatre manager: Not tested Reading Comprehension Reading Status: Not tested    Expression Expression Primary Mode of Expression: Verbal Verbal Expression Overall Verbal Expression: Appears within functional limits for tasks assessed Initiation: No impairment Level of Generative/Spontaneous Verbalization: Conversation Naming: Not tested Pragmatics: Unable to assess Interfering Components: Attention Non-Verbal Means of Communication: Not applicable Written Expression Dominant Hand: Right Written Expression: Not tested   Oral / Motor Oral Motor/Sensory Function Overall Oral Motor/Sensory Function: Appears within functional limits for tasks assessed Labial ROM: Within Functional Limits Labial Symmetry: Within Functional Limits Labial Strength: Within Functional Limits Labial Sensation: Within Functional Limits Lingual ROM: Within Functional Limits Lingual Symmetry: Within Functional Limits Lingual Strength: Within Functional Limits Lingual Sensation: Within Functional  Limits Facial ROM: Within Functional Limits Facial Symmetry: Within Functional Limits Facial Strength: Reduced Facial Sensation: Within Functional Limits Velum: Within Functional Limits Mandible: Within Functional Limits Motor Speech Overall Motor Speech: Appears within functional limits for tasks assessed Respiration: Within functional limits Phonation: Normal Resonance: Within functional limits Articulation: Within functional limitis Intelligibility: Intelligible Motor Planning: Witnin functional limits Motor Speech Errors: Not applicable   GO     Theotis Burrow 11/18/2011, 3:06 PM

## 2011-11-18 NOTE — Progress Notes (Signed)
Physical Therapy Treatment Patient Details Name: [name] MRN: [id number] DOB: November 09, 1930 Today's Date: 11/18/2011 Time: 9147-8295 PT Time Calculation (min): 29 min  PT Assessment / Plan / Recommendation Comments on Treatment Session  Patient able to progress with ambulation this session however patient is unsafe in regards to her cognition at this time. Patient confused throuhgout with decreased safety awareness. Continue to recommend CIR at this time    Follow Up Recommendations  Post acute inpatient;Supervision/Assistance - 24 hour     Does the patient have the potential to tolerate intense rehabilitation  Yes, Recommend IP Rehab Screening  Barriers to Discharge        Equipment Recommendations  None recommended by PT    Recommendations for Other Services Rehab consult  Frequency Min 4X/week   Plan Discharge plan remains appropriate;Frequency remains appropriate    Precautions / Restrictions Precautions Precautions: Fall   Pertinent Vitals/Pain     Mobility  Bed Mobility Bed Mobility: Not assessed Transfers Sit to Stand: 4: Min assist;With upper extremity assist;From bed;From toilet Stand to Sit: 4: Min assist;With upper extremity assist;To chair/3-in-1;To toilet Details for Transfer Assistance: Cues for safety and A for balance and patient becomes unsteady Ambulation/Gait Ambulation/Gait Assistance: 4: Min assist Ambulation Distance (Feet): 60 Feet (x2) Ambulation/Gait Assistance Details: Patient ambulated 60 ft x2 with A for balance and patient sways heavily and doesn't recognize her LOB unless pointed out. Attempted use of RW but patient very unsafe with use at this time. Patient with decrease control of balance with turning.  Gait Pattern: Step-through pattern;Decreased stride length;Shuffle;Ataxic;Scissoring;Narrow base of support    Exercises     PT Diagnosis:    PT Problem List:   PT Treatment Interventions:     PT Goals Acute Rehab PT Goals PT Goal:  Sit to Stand - Progress: Progressing toward goal PT Goal: Stand to Sit - Progress: Progressing toward goal PT Transfer Goal: Bed to Chair/Chair to Bed - Progress: Progressing toward goal PT Goal: Ambulate - Progress: Progressing toward goal  Visit Information  Last PT Received On: 11/18/11 Assistance Needed: +1    Subjective Data      Cognition  Overall Cognitive Status: Impaired Area of Impairment: Following commands;Memory;Safety/judgement;Awareness of errors;Awareness of deficits;Problem solving Arousal/Alertness: Awake/alert Orientation Level: Oriented X4 / Intact Behavior During Session: [facility] for tasks performed Current Attention Level: Sustained Memory: Decreased recall of precautions Following Commands: Follows one step commands inconsistently Safety/Judgement: Decreased awareness of safety precautions;Impulsive Awareness of Errors: Assistance required to identify errors made;Assistance required to correct errors made Problem Solving: max cueing for problem solving. Patient required about 3 mins to turn off water at sink properly    Balance     End of Session PT - End of Session Equipment Utilized During Treatment: Gait belt Activity Tolerance: Patient tolerated treatment well Patient left: in chair;with call bell/phone within reach Nurse Communication: Mobility status   GP     Fredrich Birks 11/18/2011, 2:33 PM 11/18/2011 Fredrich Birks PTA [phone] pager [phone] office

## 2011-11-18 NOTE — Progress Notes (Signed)
Talked with pt's brother by phone. He reports that there is no one available to provide 24/7 supervision for pt after discharge. He requests that pt go to [facility] which is near their home. In light of this and the SLP's progress note re: pt's cognitive deficits, SNF is recommended as the best option for this pt. Informed CSW of above. Please call for questions: [phone].

## 2011-11-18 NOTE — Discharge Summary (Signed)
Physician Discharge Summary  [name] MRN: [id number] DOB/AGE: 76-Mar-1932 76 y.o.  PCP: Roxy Manns, MD   Admit date: 11/16/2011 Discharge date: 11/18/2011  Discharge Diagnoses:  subacute left cerebellar hemisphere infarct Hypercalcemia  HYPERLIPIDEMIA  HYPERTENSION  CVA     Medication List     As of 11/18/2011  8:19 AM    STOP taking these medications         aspirin 325 MG tablet      TAKE these medications     lisinopril 10 mg by mouth daily     atorvastatin 20 MG tablet   Commonly known as: LIPITOR   Take 1 tablet (20 mg total) by mouth daily at 6 PM.      clopidogrel 75 MG tablet   Commonly known as: PLAVIX   Take 1 tablet (75 mg total) by mouth daily.      traMADol 50 MG tablet   Commonly known as: ULTRAM   Take 50 mg by mouth every 6 (six) hours as needed. For back pain        Discharge Condition: Stable for inpatient rehabilitation   Disposition:    Consults:  #1 inpatient rehabilitation  Significant Diagnostic Studies: Dg Chest 2 View  11/17/2011  *RADIOLOGY REPORT*  Clinical Data: Altered mental status, stroke  CHEST - 2 VIEW  Comparison: 04/10/2007  Findings: Heart size upper limits normal.  Lungs clear.  No effusion.  Mild spurring in the lower thoracic spine.  IMPRESSION:  No acute disease   Original Report Authenticated By: Osa Craver, M.D.    Ct Head Wo Contrast  11/16/2011  *RADIOLOGY REPORT*  Clinical Data: Headache and vomiting.  CT HEAD WITHOUT CONTRAST  Technique:  Contiguous axial images were obtained from the base of the skull through the vertex without contrast.  Comparison: Brain MR dated 02/14/2008 and head CT dated 02/13/2008.  Findings: Oval area of ill-defined low density in the inferior left cerebellar hemisphere.  More inferiorly, an old infarct is noted in the left inferior cerebellar hemisphere. There is there is also a small oval area of decreased density in the inferior left basal ganglia which was lower  in density in better defined previously, compatible with old lacunar infarct.  Mild patchy white matter low density in both cerebral hemispheres.  Mildly enlarged ventricles and subarachnoid spaces.  No intracranial hemorrhage or mass lesion.  Unremarkable bones and included paranasal sinuses.  IMPRESSION:  1.  Probable subacute left cerebellar hemisphere infarct. 2.  Additional old left cerebellar hemisphere infarct and old left basal ganglia lacunar infarct. 3.  Mild atrophy and mild chronic small vessel white matter ischemic changes in both cerebral hemispheres.  These results were called by telephone on 11/16/2011 at 2223 to [facility], the patient's nurse, who verbally acknowledged these results.   Original Report Authenticated By: Darrol Angel, M.D.    Mr Brain Wo Contrast  11/17/2011  *RADIOLOGY REPORT*  Clinical Data:  Mental status changes.  Disoriented.  Fall. Abnormal head CT.  MRI HEAD WITHOUT CONTRAST MRA HEAD WITHOUT CONTRAST  Technique:  Multiplanar, multiecho pulse sequences of the brain and surrounding structures were obtained without intravenous contrast. Angiographic images of the head were obtained using MRA technique without contrast.  Comparison:  Head CT 11/16/2011.  MRI 02/14/2008.  MRI HEAD  Findings:  Diffusion imaging does not show any acute or subacute infarction.  There are mild chronic small vessel changes of the pons.  There are old infarctions affecting the  inferior cerebellum on the left.  No acute cerebellar infarction.  The cerebral hemispheres show mild chronic small vessel change throughout the deep and subcortical white matter, fairly typical of a age.  No cortical or large vessel territory infarction.  No mass lesion, hemorrhage, hydrocephalus or extra-axial collection.  No pituitary mass.  No inflammatory sinus disease.  No skull or skull base lesion.  IMPRESSION: No acute or subacute infarction.  Old inferior cerebellar infarction on the left.  Chronic small vessel changes of  the pons in the cerebral hemispheric white matter, fairly typical for age.  MRA HEAD  Findings: Both internal carotid arteries are widely patent into the brain.  No siphon stenosis.  The anterior and middle cerebral vessels are patent without proximal stenosis, aneurysm or vascular malformation.  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  Both vertebral arteries are patent at the foramen magnum level. The right is dominant.  Both vessels are patent to the basilar. The basilar artery shows moderate atherosclerotic irregularity with stenosis of 40% in the proximal third.  Superior cerebellar and posterior cerebral vessels show flow, with the left PCA receiving most of its supply from the anterior circulation.  IMPRESSION: No major vessel occlusion.  No significant anterior circulation stenotic disease.  40% stenosis of the basilar artery in the proximal third.  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   Original Report Authenticated By: Thomasenia Sales, M.D.    Mr Mra Head/brain Wo Cm  11/17/2011  *RADIOLOGY REPORT*  Clinical Data:  Mental status changes.  Disoriented.  Fall. Abnormal head CT.  MRI HEAD WITHOUT CONTRAST MRA HEAD WITHOUT CONTRAST  Technique:  Multiplanar, multiecho pulse sequences of the brain and surrounding structures were obtained without intravenous contrast. Angiographic images of the head were obtained using MRA technique without contrast.  Comparison:  Head CT 11/16/2011.  MRI 02/14/2008.  MRI HEAD  Findings:  Diffusion imaging does not show any acute or subacute infarction.  There are mild chronic small vessel changes of the pons.  There are old infarctions affecting the inferior cerebellum on the left.  No acute cerebellar infarction.  The cerebral hemispheres show mild chronic small vessel change throughout the deep and subcortical white matter, fairly typical of a age.  No  cortical or large vessel territory infarction.  No mass lesion, hemorrhage, hydrocephalus or extra-axial collection.  No pituitary mass.  No inflammatory sinus disease.  No skull or skull base lesion.  IMPRESSION: No acute or subacute infarction.  Old inferior cerebellar infarction on the left.  Chronic small vessel changes of the pons in the cerebral hemispheric white matter, fairly typical for age.  MRA HEAD  Findings: Both internal carotid arteries are widely patent into the brain.  No siphon stenosis.  The anterior and middle cerebral vessels are patent without proximal stenosis, aneurysm or vascular malformation.  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  Both vertebral arteries are patent at the foramen magnum level. The right is dominant.  Both vessels are patent to the basilar. The basilar artery shows moderate atherosclerotic irregularity with stenosis of 40% in the proximal third.  Superior cerebellar and posterior cerebral vessels show flow, with the left PCA receiving most of its supply from the anterior circulation.  IMPRESSION: No major vessel occlusion.  No significant anterior circulation stenotic disease.  40% stenosis of  the basilar artery in the proximal third.  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   Original Report Authenticated By: Thomasenia Sales, M.D.      2-D echo done results pending    Microbiology: Recent Results (from the past 240 hour(s))  GRAM STAIN     Status: Normal   Collection Time   11/16/11  9:30 PM      Component Value Range Status Comment   Specimen Description URINE, RANDOM   Final    Special Requests ADDED 2350   Final    Gram Stain     Final    Value: CYTOSPIN SLIDE     WBC PRESENT,BOTH PMN AND MONONUCLEAR     NEGATIVE FOR BACTERIA   Report Status 11/17/2011 FINAL   Final      Labs: Results for orders placed during the hospital  encounter of 11/16/11 (from the past 48 hour(s))  GRAM STAIN     Status: Normal   Collection Time   11/16/11  9:30 PM      Component Value Range Comment   Specimen Description URINE, RANDOM      Special Requests ADDED 2350      Gram Stain        Value: CYTOSPIN SLIDE     WBC PRESENT,BOTH PMN AND MONONUCLEAR     NEGATIVE FOR BACTERIA   Report Status 11/17/2011 FINAL     URINALYSIS, ROUTINE W REFLEX MICROSCOPIC     Status: Abnormal   Collection Time   11/16/11  9:33 PM      Component Value Range Comment   Color, Urine YELLOW  YELLOW    APPearance TURBID (*) CLEAR    Specific Gravity, Urine 1.013  1.005 - 1.030    pH 8.0  5.0 - 8.0    Glucose, UA NEGATIVE  NEGATIVE mg/dL    Hgb urine dipstick NEGATIVE  NEGATIVE    Bilirubin Urine NEGATIVE  NEGATIVE    Ketones, ur 15 (*) NEGATIVE mg/dL    Protein, ur 295 (*) NEGATIVE mg/dL    Urobilinogen, UA 0.2  0.0 - 1.0 mg/dL    Nitrite NEGATIVE  NEGATIVE    Leukocytes, UA NEGATIVE  NEGATIVE   URINE MICROSCOPIC-ADD ON     Status: Normal   Collection Time   11/16/11  9:33 PM      Component Value Range Comment   Squamous Epithelial / LPF RARE  RARE    WBC, UA 0-2  <3 WBC/hpf    RBC / HPF 0-2  <3 RBC/hpf    Bacteria, UA RARE  RARE    Urine-Other AMORPHOUS URATES/PHOSPHATES     CBC WITH DIFFERENTIAL     Status: Abnormal   Collection Time   11/16/11  9:37 PM      Component Value Range Comment   WBC 8.2  4.0 - 10.5 K/uL    RBC 3.92  3.87 - 5.11 MIL/uL    Hemoglobin 12.9  12.0 - 15.0 g/dL    HCT 62.1  30.8 - 65.7 %    MCV 93.6  78.0 - 100.0 fL    MCH 32.9  26.0 - 34.0 pg    MCHC 35.1  30.0 - 36.0 g/dL    RDW 84.6  96.2 - 95.2 %    Platelets 201  150 - 400 K/uL    Neutrophils Relative 86 (*) 43 - 77 %    Neutro Abs 7.0  1.7 - 7.7 K/uL  Lymphocytes Relative 10 (*) 12 - 46 %    Lymphs Abs 0.9  0.7 - 4.0 K/uL    Monocytes Relative 3  3 - 12 %    Monocytes Absolute 0.3  0.1 - 1.0 K/uL    Eosinophils Relative 0  0 - 5 %    Eosinophils  Absolute 0.0  0.0 - 0.7 K/uL    Basophils Relative 0  0 - 1 %    Basophils Absolute 0.0  0.0 - 0.1 K/uL   COMPREHENSIVE METABOLIC PANEL     Status: Abnormal   Collection Time   11/16/11  9:37 PM      Component Value Range Comment   Sodium 135  135 - 145 mEq/L    Potassium 3.9  3.5 - 5.1 mEq/L    Chloride 99  96 - 112 mEq/L    CO2 25  19 - 32 mEq/L    Glucose, Bld 136 (*) 70 - 99 mg/dL    BUN 14  6 - 23 mg/dL    Creatinine, Ser 4.09  0.50 - 1.10 mg/dL    Calcium 81.1 (*) 8.4 - 10.5 mg/dL    Total Protein 7.4  6.0 - 8.3 g/dL    Albumin 3.9  3.5 - 5.2 g/dL    AST 19  0 - 37 U/L    ALT 12  0 - 35 U/L    Alkaline Phosphatase 77  39 - 117 U/L    Total Bilirubin 0.4  0.3 - 1.2 mg/dL    GFR calc non Af Amer 58 (*) >90 mL/min    GFR calc Af Amer 68 (*) >90 mL/min   HEMOGLOBIN A1C     Status: Abnormal   Collection Time   11/17/11  5:45 AM      Component Value Range Comment   Hemoglobin A1C 5.9 (*) <5.7 %    Mean Plasma Glucose 123 (*) <117 mg/dL   LIPID PANEL     Status: Normal   Collection Time   11/17/11  5:45 AM      Component Value Range Comment   Cholesterol 181  0 - 200 mg/dL    Triglycerides 69  <914 mg/dL    HDL 79  >78 mg/dL    Total CHOL/HDL Ratio 2.3      VLDL 14  0 - 40 mg/dL    LDL Cholesterol 88  0 - 99 mg/dL      HPI :[name] is a 78 y.o. female who was last seen normal at 4:30 pm, and at 5.00 pm was found laying on the floor naked by her family. She was disoriented and appeared to be trying to go to the bathroom and had fallen. She lives alone and is very independent at baseline per her family.  Found by family confused and naked about 1830. Last heard from normal 1630. Presenting to ED at 2100. CN 2-12 intact (decreased hearing), 5/5 strength throughout. Some ataxia on finger to nose. Was deemed Not to be a tPA candidate given delayed presentation and improving symptoms   HOSPITAL COURSE:  #1 subacute CVA. MRI was done in the past for stroke workup that  showed an old inferior cerebellar infarction on the left, chronic small vessel changes of the pons and cerebellar hemispheric white matter MRA showed 40% stenosis of the basilar artery the proximal third A 4 mm aneurysm from the cavernous carotid on the right The patient takes aspirin at home In the acute setting she was continued  on aspirin, subsequently switched to Plavix. Lipid panel was done with HDL and LDL within normal limits, given her acute CVA she was started on Lipitor Inpatient rehabilitation was consulted She was deemed to be appropriate for inpatient rehabilitation and is being transferred to the services today   #2 hypertension Blood pressures remained elevated between 150s and 160s She will be started on lisinopril She will need a repeat BMP in 2 weeks  #3 hypercalcemia Currently asymptomatic, Full workup including intact PTH, PTH related protein, TSH and 25-hydroxy vitamin D pending She will need a repeat CMP in 2 weeks    Discharge Exam:  Blood pressure 164/91, pulse 84, temperature 99.4 F (37.4 C), temperature source Oral, resp. rate 18, height 5\' 7"  (1.702 m), weight 70.308 kg (155 lb), SpO2 95.00%.  HEENT: Normocephalic and Atraumatic, Mucous membranes pink; PERRLA; EOM intact; Fundi: Benign; No scleral icterus, Nares: Patent, Oropharynx: Clear, Fair Dentition, Neck: FROM, no cervical lymphadenopathy nor thyromegaly or carotid bruit; no JVD;  Breasts:: Not examined  CHEST WALL: No tenderness  CHEST: Normal respiration, clear to auscultation bilaterally  HEART: Regular rate and rhythm; no murmurs rubs or gallops  BACK: No kyphosis or scoliosis; no CVA tenderness  ABDOMEN: Positive Bowel Sounds, soft non-tender; no masses, no organomegaly.  Rectal Exam: Not done  EXTREMITIES: No bone or joint deformity; age-appropriate arthropathy of the hands and knees; no cyanosis, clubbing or edema; no ulcerations.  Genitalia: not examined  PULSES: 2+ and symmetric  SKIN:  Normal hydration no rash or ulceration  CNS: Cranial nerves 2-12, Except for Decreased Hearing (Cronic), and trouble with Cerebellar maneuvers, Otherwise no focal neurologic deficit      Follow up recommendations #1 she will need followup with [facility] in one to 2 weeks posthospitalization as well as further evaluation of a 4 mm aneurysm #2 CMP in 2 weeks   Signed: Richarda Overlie 11/18/2011, 8:19 AM

## 2011-11-18 NOTE — Progress Notes (Signed)
CIR Admissions: Met with pt to evaluate for CIR. Pt oriented but some confusion noted during conversation. Pt lives alone and reports that there is no one available to provide supervision if needed after d/c. Pt suggested that she could stay with her cousin. Called pt's cousin who reported that this would not be possible. Have left a message with pt's brother to call me to discuss d/c plans. Would like to talk with family in order to make the best decision for this pt. SNF may be the best option if there is no  family available to provide 24/7 supervision.  Please call for questions: [phone]

## 2011-11-19 LAB — PTH, INTACT AND CALCIUM: PTH: 108 pg/mL — ABNORMAL HIGH (ref 14.0–72.0)

## 2011-11-19 MED ORDER — LISINOPRIL 10 MG PO TABS
10.0000 mg | ORAL_TABLET | Freq: Every day | ORAL | Status: DC
  Administered 2011-11-19 – 2011-11-21 (×3): 10 mg via ORAL
  Filled 2011-11-19 (×3): qty 1

## 2011-11-19 MED ORDER — CLOPIDOGREL BISULFATE 75 MG PO TABS
75.0000 mg | ORAL_TABLET | Freq: Every day | ORAL | Status: DC
  Administered 2011-11-19 – 2011-11-21 (×3): 75 mg via ORAL
  Filled 2011-11-19 (×4): qty 1

## 2011-11-19 MED ORDER — LORAZEPAM 1 MG PO TABS
1.0000 mg | ORAL_TABLET | Freq: Once | ORAL | Status: AC
  Administered 2011-11-20: 1 mg via ORAL
  Filled 2011-11-19: qty 1

## 2011-11-19 MED ORDER — TRAMADOL HCL 50 MG PO TABS: 50.0000 mg | ORAL_TABLET | Freq: Four times a day (QID) | ORAL | Status: DC | PRN

## 2011-11-19 NOTE — Clinical Social Work Placement (Addendum)
    Clinical Social Work Department CLINICAL SOCIAL WORK PLACEMENT NOTE 11/19/2011  Patient:  [name], [name]  Account Number:  [id number] Admit date:  11/16/2011  Clinical Social Worker:  Peggyann Shoals  Date/time:  11/19/2011 04:55 PM  Clinical Social Work is seeking post-discharge placement for this patient at the following level of care:   SKILLED NURSING   (*CSW will update this form in Epic as items are completed)   11/19/2011  Patient/family provided with [facility] Department of Clinical Social Work's list of facilities offering this level of care within the geographic area requested by the patient (or if unable, by the patient's family).  11/19/2011  Patient/family informed of their freedom to choose among providers that offer the needed level of care, that participate in Medicare, Medicaid or managed care program needed by the patient, have an available bed and are willing to accept the patient.  11/19/2011  Patient/family informed of [facility]' ownership interest in [facility], as well as of the fact that they are under no obligation to receive care at this facility.  PASARR submitted to EDS on 11/19/2011 PASARR number received from EDS on 11/19/2011  FL2 transmitted to all facilities in geographic area requested by pt/family on  11/19/2011 FL2 transmitted to all facilities within larger geographic area on   Patient informed that his/her managed care company has contracts with or will negotiate with  certain facilities, including the following:     Patient/family informed of bed offers received:  11/20/2011 Patient chooses bed at [facility]  Physician recommends and patient chooses bed at  [facility]  Patient to be transferred to [facility] on 11/20/2011 Patient to be transferred to facility by [name]  The following physician request were entered in Epic:   Additional Comments:

## 2011-11-19 NOTE — Clinical Social Work Psychosocial (Signed)
     Clinical Social Work Department BRIEF PSYCHOSOCIAL ASSESSMENT 11/19/2011  Patient:  [name], [name]     Account Number:  [id number]     Admit date:  11/16/2011  Clinical Social Worker:  Peggyann Shoals  Date/Time:  11/19/2011 04:45 PM  Referred by:  Physician  Date Referred:  11/19/2011 Referred for  SNF Placement   Other Referral:   Interview type:  Family Other interview type:    PSYCHOSOCIAL DATA Living Status:  ALONE Admitted from facility:   Level of care:   Primary support name:  Ether Griffins Primary support relationship to patient:  FAMILY Degree of support available:   supportive    CURRENT CONCERNS Current Concerns  Post-Acute Placement   Other Concerns:    SOCIAL WORK ASSESSMENT / PLAN CSW met with pt and brother and sister in law to address consult for SNF. CSW introduced herself and explained role of social work. CSW also explained the process of SNF placement. Pt lives alone and have family who live near by.    Pt is agreeable to SNF placement. CSW will inititate SNF referral for [facility] and follow up with bed offers. CSW will continue to follow.   Assessment/plan status:  Psychosocial Support/Ongoing Assessment of Needs Other assessment/ plan:   Information/referral to community resources:   SNF List    PATIENTS/FAMILYS RESPONSE TO PLAN OF CARE: Pt was alert and oriented to self and place. Pt's family is agreeable to SNF placement at [facility].

## 2011-11-19 NOTE — Progress Notes (Addendum)
[facility] PROGRESS NOTE  [name] WUJ:[id number] DOB: 1930-10-29 DOA: 11/16/2011 PCP: Roxy Manns, MD  Assessment/Plan: Active Problems:  HYPERLIPIDEMIA  HYPERTENSION  CVA  No change in discharge summary from 11/18/11    HOSPITAL COURSE:  #1 subacute CVA.  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  Lipid panel was done with HDL and LDL within normal limits, given her acute CVA she was started on Lipitor  Inpatient rehabilitation was consulted  She was deemed to be appropriate for inpatient rehabilitation and will be evaluated by them, otherwise go to SNF when bed available, if considered not to be appropriate for inpatient rehabilitation    #2 hypertension  Blood pressures remained elevated between 150s and 160s  She will be started on lisinopril  She will need a repeat BMP in 2 weeks  #3 hypercalcemia  Currently asymptomatic,  Full workup including intact PTH, PTH related protein, TSH and 25-hydroxy vitamin D pending  She will need a repeat CMP in 2 weeks    #4 disposition Awaiting placement in a skilled nursing facility  Procedures:  2-D echo LV EF: 60% - 65%  ------------------------------------------------------------ Indications: CVA 436.  ------------------------------------------------------------ History: PMH: Syncope. Risk factors: Hypertension. Dyslipidemia.  ------------------------------------------------------------ Study Conclusions  - Left ventricle: The cavity size was normal. There was moderate concentric hypertrophy. Systolic function was normal. The  estimated ejection fraction was in the range of 60% to 65%. Wall motion was normal; there were no regional wall motion abnormalities. Doppler parameters are consistent with abnormal left ventricular relaxation (grade 1 diastolic dysfunction). The E/e' ratio is >10, suggesting elevated LV filling pressure. - Aortic valve: Trivial regurgitation. - Left atrium: The atrium was normal in size. - Inferior vena cava: The vessel was normal in size; the respirophasic diameter changes were in the normal range (= 50%); findings are consistent with normal central venous pressure.    Antibiotics:  None  HPI/Subjective: Confused at baseline  Objective: Filed Vitals:   11/18/11 1835 11/19/11 0001 11/19/11 0330 11/19/11 0652  BP: 183/97 165/98 153/81 155/95  Pulse:  90 87 84  Temp:  98.1 F (36.7 C) 98 F (36.7 C) 97.8 F (36.6 C)  TempSrc:  Oral Oral Oral  Resp:  18 20 18   Height:      Weight:      SpO2:  96% 97% 97%    Intake/Output Summary (Last 24 hours) at 11/19/11 9562 Last data filed at 11/18/11 1837  Gross per 24 hour  Intake    240 ml  Output      0 ml  Net    240 ml    Exam:  CHEST WALL: No tenderness  CHEST: Normal respiration, clear to auscultation bilaterally  HEART: Regular rate and rhythm; no murmurs rubs or gallops  BACK: No kyphosis or scoliosis; no CVA tenderness  ABDOMEN: Positive Bowel Sounds, soft non-tender; no masses, no organomegaly.  Rectal Exam: Not done  EXTREMITIES: No bone or joint deformity; age-appropriate arthropathy of the hands and knees; no cyanosis, clubbing or edema; no ulcerations.  Genitalia: not examined  PULSES: 2+ and symmetric  SKIN: Normal hydration no rash or ulceration  CNS: Cranial nerves 2-12, Except for Decreased Hearing (Cronic), and trouble with Cerebellar maneuvers, Otherwise no focal neurologic deficit     Data Reviewed: Basic Metabolic Panel:  Lab 11/18/11 4098 11/16/11 2137  NA 139 135  K 3.8 3.9  CL 102 99    CO2 25 25  GLUCOSE 109* 136*  BUN 14 14  CREATININE 0.86 0.91  CALCIUM 10.8* 11.2*  MG -- --  PHOS -- --    Liver Function Tests:  Lab 11/18/11 0900 11/16/11 2137  AST 24 19  ALT 12 12  ALKPHOS 74 77  BILITOT 0.5 0.4  PROT 7.5 7.4  ALBUMIN 4.1 3.9   No results found for this basename: LIPASE:5,AMYLASE:5 in the last 168 hours No results found for this basename: AMMONIA:5 in the last 168 hours  CBC:  Lab 11/16/11 2137  WBC 8.2  NEUTROABS 7.0  HGB 12.9  HCT 36.7  MCV 93.6  PLT 201    Cardiac Enzymes: No results found for this basename: CKTOTAL:5,CKMB:5,CKMBINDEX:5,TROPONINI:5 in the last 168 hours BNP (last 3 results) No results found for this basename: PROBNP:3 in the last 8760 hours   CBG: No results found for this basename: GLUCAP:5 in the last 168 hours  Recent Results (from the past 240 hour(s))  GRAM STAIN     Status: Normal   Collection Time   11/16/11  9:30 PM      Component Value Range Status Comment   Specimen Description URINE, RANDOM   Final    Special Requests ADDED 2350   Final    Gram Stain     Final    Value: CYTOSPIN SLIDE     WBC PRESENT,BOTH PMN AND MONONUCLEAR     NEGATIVE FOR BACTERIA   Report Status 11/17/2011 FINAL   Final   URINE CULTURE     Status: Normal   Collection Time   11/16/11  9:30 PM      Component Value Range Status Comment   Specimen Description URINE, CLEAN CATCH   Final    Special Requests ADDED 2350   Final    Culture  Setup Time 11/17/2011 12:52   Final    Colony Count NO GROWTH   Final    Culture NO GROWTH   Final    Report Status 11/18/2011 FINAL   Final      Studies: Dg Chest 2 View  11/17/2011  *RADIOLOGY REPORT*  Clinical Data: Altered mental status, stroke  CHEST - 2 VIEW  Comparison: 04/10/2007  Findings: Heart size upper limits normal.  Lungs clear.  No effusion.  Mild spurring in the lower thoracic spine.  IMPRESSION:  No acute disease   Original Report Authenticated By: Osa Craver, M.D.     Ct Head Wo Contrast  11/16/2011  *RADIOLOGY REPORT*  Clinical Data: Headache and vomiting.  CT HEAD WITHOUT CONTRAST  Technique:  Contiguous axial images were obtained from the base of the skull through the vertex without contrast.  Comparison: Brain MR dated 02/14/2008 and head CT dated 02/13/2008.  Findings: Oval area of ill-defined low density in the inferior left cerebellar hemisphere.  More inferiorly, an old infarct is noted in the left inferior cerebellar hemisphere. There is there is also a small oval area of decreased density in the inferior left basal ganglia which was lower in density in better defined previously, compatible with old lacunar infarct.  Mild patchy white matter low density in both cerebral hemispheres.  Mildly enlarged ventricles and subarachnoid spaces.  No intracranial hemorrhage or mass lesion.  Unremarkable bones and included paranasal sinuses.  IMPRESSION:  1.  Probable subacute left cerebellar hemisphere infarct. 2.  Additional old left cerebellar hemisphere infarct and old left basal ganglia lacunar infarct. 3.  Mild atrophy and mild chronic small vessel white matter ischemic changes in both cerebral hemispheres.  These results were called by telephone on 11/16/2011 at 2223 to [facility], the patient's nurse, who verbally acknowledged these results.   Original Report Authenticated By: Darrol Angel, M.D.    Mr Brain Wo Contrast  11/17/2011  *RADIOLOGY REPORT*  Clinical Data:  Mental status changes.  Disoriented.  Fall. Abnormal head CT.  MRI HEAD WITHOUT CONTRAST MRA HEAD WITHOUT CONTRAST  Technique:  Multiplanar, multiecho pulse sequences of the brain and surrounding structures were obtained without intravenous contrast. Angiographic images of the head were obtained using MRA technique without contrast.  Comparison:  Head CT 11/16/2011.  MRI 02/14/2008.  MRI HEAD  Findings:  Diffusion imaging does not show any acute or subacute infarction.  There are mild chronic small vessel  changes of the pons.  There are old infarctions affecting the inferior cerebellum on the left.  No acute cerebellar infarction.  The cerebral hemispheres show mild chronic small vessel change throughout the deep and subcortical white matter, fairly typical of a age.  No cortical or large vessel territory infarction.  No mass lesion, hemorrhage, hydrocephalus or extra-axial collection.  No pituitary mass.  No inflammatory sinus disease.  No skull or skull base lesion.  IMPRESSION: No acute or subacute infarction.  Old inferior cerebellar infarction on the left.  Chronic small vessel changes of the pons in the cerebral hemispheric white matter, fairly typical for age.  MRA HEAD  Findings: Both internal carotid arteries are widely patent into the brain.  No siphon stenosis.  The anterior and middle cerebral vessels are patent without proximal stenosis, aneurysm or vascular malformation.  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  Both vertebral arteries are patent at the foramen magnum level. The right is dominant.  Both vessels are patent to the basilar. The basilar artery shows moderate atherosclerotic irregularity with stenosis of 40% in the proximal third.  Superior cerebellar and posterior cerebral vessels show flow, with the left PCA receiving most of its supply from the anterior circulation.  IMPRESSION: No major vessel occlusion.  No significant anterior circulation stenotic disease.  40% stenosis of the basilar artery in the proximal third.  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   Original Report Authenticated By: Thomasenia Sales, M.D.    Mr Mra Head/brain Wo Cm  11/17/2011  *RADIOLOGY REPORT*  Clinical Data:  Mental status changes.  Disoriented.  Fall. Abnormal head CT.  MRI HEAD WITHOUT CONTRAST MRA HEAD WITHOUT CONTRAST  Technique:  Multiplanar, multiecho pulse sequences of the brain and  surrounding structures were obtained without intravenous contrast. Angiographic images of the head were obtained using MRA technique without contrast.  Comparison:  Head CT 11/16/2011.  MRI 02/14/2008.  MRI HEAD  Findings:  Diffusion imaging does not show any acute or subacute infarction.  There are mild chronic small vessel changes of the pons.  There are old infarctions affecting the inferior cerebellum on the left.  No acute cerebellar infarction.  The cerebral hemispheres show mild chronic small vessel change throughout the deep and subcortical white matter,  fairly typical of a age.  No cortical or large vessel territory infarction.  No mass lesion, hemorrhage, hydrocephalus or extra-axial collection.  No pituitary mass.  No inflammatory sinus disease.  No skull or skull base lesion.  IMPRESSION: No acute or subacute infarction.  Old inferior cerebellar infarction on the left.  Chronic small vessel changes of the pons in the cerebral hemispheric white matter, fairly typical for age.  MRA HEAD  Findings: Both internal carotid arteries are widely patent into the brain.  No siphon stenosis.  The anterior and middle cerebral vessels are patent without proximal stenosis, aneurysm or vascular malformation.  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  Both vertebral arteries are patent at the foramen magnum level. The right is dominant.  Both vessels are patent to the basilar. The basilar artery shows moderate atherosclerotic irregularity with stenosis of 40% in the proximal third.  Superior cerebellar and posterior cerebral vessels show flow, with the left PCA receiving most of its supply from the anterior circulation.  IMPRESSION: No major vessel occlusion.  No significant anterior circulation stenotic disease.  40% stenosis of the basilar artery in the proximal third.  4 mm medially projecting aneurysm arising from the cavernous carotid on the  right.   Original Report Authenticated By: Thomasenia Sales, M.D.     Scheduled Meds:   . aspirin  300 mg Rectal Daily   Or  . aspirin  325 mg Oral Daily  . atorvastatin  20 mg Oral q1800  . enoxaparin  40 mg Subcutaneous Daily  . influenza  inactive virus vaccine  0.5 mL Intramuscular Tomorrow-1000  . pneumococcal 23 valent vaccine  0.5 mL Intramuscular Tomorrow-1000   Continuous Infusions:   Active Problems:  HYPERLIPIDEMIA  HYPERTENSION  CVA    Time spent: 40 minutes   [facility]  [facility] Pager [phone]. If 8PM-8AM, please contact night-coverage at [email or web address], password [facility] 11/19/2011, 8:23 AM  LOS: 3 days

## 2011-11-19 NOTE — Progress Notes (Signed)
Physical Therapy Treatment Patient Details Name: [name] MRN: [id number] DOB: 11-11-1930 Today's Date: 11/19/2011 Time: 7829-5621 PT Time Calculation (min): 25 min  PT Assessment / Plan / Recommendation Comments on Treatment Session  Patient progressing with ambulation and mobility however still lmiited by poor cognition and judgement for task. Patient attempts to get back up out of bed to follow PT when treatment was completed. When asked patient what she was doing she stated that she was suppose to walk with me. This was after we had already walked    Follow Up Recommendations  Post acute inpatient;Supervision/Assistance - 24 hour     Does the patient have the potential to tolerate intense rehabilitation  Yes, Recommend IP Rehab Screening  Barriers to Discharge        Equipment Recommendations  None recommended by PT;None recommended by OT    Recommendations for Other Services    Frequency Min 4X/week   Plan Discharge plan remains appropriate;Frequency remains appropriate    Precautions / Restrictions Precautions Precautions: Fall   Pertinent Vitals/Pain     Mobility  Bed Mobility Supine to Sit: 5: Supervision Sitting - Scoot to Edge of Bed: 5: Supervision Transfers Sit to Stand: 4: Min assist;With upper extremity assist;From bed Stand to Sit: 4: Min assist;With upper extremity assist;To bed Details for Transfer Assistance: Cues for safe technique as patient slightly impulsive when initiating stand.  Ambulation/Gait Ambulation/Gait Assistance: 3: Mod assist Ambulation Distance (Feet): 250 Feet Assistive device: None Ambulation/Gait Assistance Details: Patient requring up to Mod A for balance as patient tends to lean to right side and bump into objects on her right side. Patient needing cues to seperate feet as she ambulates with narrow BOS Gait Pattern: Narrow base of support;Decreased stride length;Scissoring Modified Rankin (Stroke Patients Only) Pre-Morbid  Rankin Score: No symptoms Modified Rankin: Moderately severe disability    Exercises     PT Diagnosis:    PT Problem List:   PT Treatment Interventions:     PT Goals Acute Rehab PT Goals PT Goal: Supine/Side to Sit - Progress: Progressing toward goal PT Goal: Sit to Supine/Side - Progress: Progressing toward goal PT Goal: Sit to Stand - Progress: Progressing toward goal PT Goal: Stand to Sit - Progress: Progressing toward goal PT Transfer Goal: Bed to Chair/Chair to Bed - Progress: Progressing toward goal  Visit Information  Last PT Received On: 11/19/11 Assistance Needed: +1    Subjective Data      Cognition  Overall Cognitive Status: Impaired Area of Impairment: Following commands;Safety/judgement;Awareness of errors;Problem solving;Executive functioning Arousal/Alertness: Awake/alert Orientation Level: Disoriented to;Situation Behavior During Session: [facility] for tasks performed Current Attention Level: Sustained Following Commands: Follows one step commands inconsistently Safety/Judgement: Decreased safety judgement for tasks assessed;Decreased awareness of safety precautions Cognition - Other Comments: Patient stating that she feels she could go home and take care of herself. When asked if she thought she could cook she stated " i guess I could try"    Balance     End of Session PT - End of Session Equipment Utilized During Treatment: Gait belt Activity Tolerance: Patient tolerated treatment well Patient left: in bed;with call bell/phone within reach;with bed alarm set Nurse Communication: Mobility status   GP     Fredrich Birks 11/19/2011, 12:59 PM 11/19/2011 Fredrich Birks PTA [phone] pager [phone] office

## 2011-11-19 NOTE — Progress Notes (Signed)
Occupational Therapy Treatment Patient Details Name: [name] MRN: [id number] DOB: Jul 14, 1930 Today's Date: 11/19/2011 Time: 8295-6213 OT Time Calculation (min): 13 min  OT Assessment / Plan / Recommendation    Follow Up Recommendations  Skilled nursing facility       Equipment Recommendations  None recommended by OT          Plan Discharge plan remains appropriate;Discharge plan needs to be updated    Precautions / Restrictions Precautions Precautions: Fall       ADL  Toilet Transfer: Performed;Minimal assistance Toilet Transfer Method: Sit to Barista: Comfort height toilet;Grab bars Toileting - Architect and Hygiene: Performed;Minimal assistance Where Assessed - Engineer, mining and Hygiene: Standing Transfers/Ambulation Related to ADLs: Verbal cues needed for safety. Educated pt and family member on calling for assist and not getting up alone. Familly will notify RN prior to leaving.  Pt demonstrated decreased memory and safety awareness during OT sesison      OT Goals ADL Goals ADL Goal: Toilet Transfer - Progress: Progressing toward goals  Visit Information  Last OT Received On: 11/19/11 Assistance Needed: +1          Cognition  Overall Cognitive Status: Impaired Area of Impairment: Memory;Safety/judgement Arousal/Alertness: Awake/alert Orientation Level: Disoriented to;Situation Behavior During Session: [facility] for tasks performed Current Attention Level: Sustained Following Commands: Follows one step commands inconsistently Safety/Judgement: Decreased awareness of safety precautions;Decreased awareness of need for assistance Cognition - Other Comments: Patient stating that she feels she could go home and take care of herself. When asked if she thought she could cook she stated " i guess I could try"    Mobility  Shoulder Instructions Bed Mobility Supine to Sit: 5: Supervision Sitting - Scoot to Edge  of Bed: 5: Supervision Transfers Sit to Stand: 4: Min assist;With upper extremity assist;From bed Stand to Sit: 4: Min assist;With upper extremity assist;To bed Details for Transfer Assistance: Cues for safe technique as patient slightly impulsive when initiating stand.              End of Session OT - End of Session Activity Tolerance: Patient tolerated treatment well Patient left: in chair;with call bell/phone within reach;with family/visitor present       [name] 11/19/2011, 3:34 PM

## 2011-11-20 DIAGNOSIS — E213 Hyperparathyroidism, unspecified: Secondary | ICD-10-CM | POA: Diagnosis present

## 2011-11-20 NOTE — Progress Notes (Signed)
Physical Therapy Treatment Patient Details Name: [name] MRN: [id number] DOB: 07-11-1930 Today's Date: 11/20/2011 Time: 7829-5621 PT Time Calculation (min): 24 min  PT Assessment / Plan / Recommendation Comments on Treatment Session  Patient able to utilize RW this session and progressing with ambulation. Patient still limited by cognitive deficits and decreased awareness and processing    Follow Up Recommendations        Does the patient have the potential to tolerate intense rehabilitation     Barriers to Discharge        Equipment Recommendations  None recommended by PT    Recommendations for Other Services    Frequency Min 4X/week   Plan Discharge plan remains appropriate;Frequency remains appropriate    Precautions / Restrictions Precautions Precautions: Fall Restrictions Weight Bearing Restrictions: No   Pertinent Vitals/Pain     Mobility  Bed Mobility Bed Mobility: Not assessed Transfers Sit to Stand: 4: Min guard;With upper extremity assist;From bed Stand to Sit: 4: Min guard;With upper extremity assist;To chair/3-in-1 Details for Transfer Assistance: Cues for safety and technique Ambulation/Gait Ambulation/Gait Assistance: 4: Min assist Ambulation Distance (Feet): 250 Feet Assistive device: Rolling walker Ambulation/Gait Assistance Details: Attempted use of RW this session and patient was able to use in a safer manner then when we attempted it on Monday. Patient still requiring use and for safety. Patient becomes easily distracted with ambulation and requires cues to stay focused on task  Gait Pattern: Step-through pattern;Decreased stride length;Scissoring;Narrow base of support Modified Rankin (Stroke Patients Only) Pre-Morbid Rankin Score: No symptoms Modified Rankin: Moderately severe disability    Exercises General Exercises - Lower Extremity Long Arc Quad: AROM;Both;20 reps Hip Flexion/Marching: AROM;Both;20 reps   PT Diagnosis:    PT  Problem List:   PT Treatment Interventions:     PT Goals Acute Rehab PT Goals PT Goal: Sit to Stand - Progress: Progressing toward goal PT Goal: Stand to Sit - Progress: Progressing toward goal PT Transfer Goal: Bed to Chair/Chair to Bed - Progress: Progressing toward goal PT Goal: Ambulate - Progress: Progressing toward goal  Visit Information  Last PT Received On: 11/20/11 Assistance Needed: +1    Subjective Data      Cognition  Overall Cognitive Status: Impaired Area of Impairment: Safety/judgement;Following commands;Awareness of deficits Arousal/Alertness: Awake/alert Orientation Level: Oriented X4 / Intact Behavior During Session: [facility] for tasks performed Current Attention Level: Sustained Following Commands: Follows multi-step commands inconsistently Safety/Judgement: Decreased awareness of safety precautions;Impulsive    Balance     End of Session PT - End of Session Equipment Utilized During Treatment: Gait belt Activity Tolerance: Patient tolerated treatment well Patient left: in chair;with call bell/phone within reach Nurse Communication: Mobility status   GP     Fredrich Birks 11/20/2011, 2:06 PM 11/20/2011 Fredrich Birks PTA [phone] pager [phone] office

## 2011-11-20 NOTE — Clinical Social Work Note (Signed)
Clinical Social Work  CSW met with pt to provide bed offers. Pt's preferred facility was not able to make an offer. Pt's family declined pt's second choice, which pt was agreeable to. Pt's family made alternative choice of facility. CSW sent discharge information, however facility was unable to order medications after business hours. CSW spoke with pt's niece, who was able and willing to sign pt into facility. Pt's niece shared that is understanding of the urgency of discharge, as she is medically ready.   CSW alerted MD.   CSW will continue to follow to facilitate dishcarge on Thurs, 10/10.   Dede Query, MSW, Theresia Majors [phone]

## 2011-11-20 NOTE — Discharge Summary (Addendum)
Physician Discharge Summary  [name] MRN: [id number] DOB/AGE: 76-04-1930 76 y.o.  PCP: Roxy Manns, MD   Admit date: 11/16/2011 Discharge date: 11/20/2011  Issues to follow up Repeat BMET in 2 weeks due to starting acei and to follow up hypercalcemia  Schedule visit with neurology as outpatient to further discuss plan of care    Discharge Diagnoses:  subacute left cerebellar hemisphere infarct Hypercalcemia with elevated PTH level -needs close outpt f/u   HYPERLIPIDEMIA  HYPERTENSION      Medication List     As of 11/18/2011  8:19 AM    STOP taking these medications         aspirin 325 MG tablet      TAKE these medications     lisinopril 10 mg by mouth daily     atorvastatin 20 MG tablet   Commonly known as: LIPITOR   Take 1 tablet (20 mg total) by mouth daily at 6 PM.      clopidogrel 75 MG tablet   Commonly known as: PLAVIX   Take 1 tablet (75 mg total) by mouth daily.      traMADol 50 MG tablet   Commonly known as: ULTRAM   Take 50 mg by mouth every 6 (six) hours as needed. For back pain        Discharge Condition: Stable for snf  Disposition:  SNF  Consults:  #1 inpatient rehabilitation  Significant Diagnostic Studies: Dg Chest 2 View  11/17/2011  *RADIOLOGY REPORT*  Clinical Data: Altered mental status, stroke  CHEST - 2 VIEW  Comparison: 04/10/2007  Findings: Heart size upper limits normal.  Lungs clear.  No effusion.  Mild spurring in the lower thoracic spine.  IMPRESSION:  No acute disease   Original Report Authenticated By: Osa Craver, M.D.    Ct Head Wo Contrast  11/16/2011  *RADIOLOGY REPORT*  Clinical Data: Headache and vomiting.  CT HEAD WITHOUT CONTRAST  Technique:  Contiguous axial images were obtained from the base of the skull through the vertex without contrast.  Comparison: Brain MR dated 02/14/2008 and head CT dated 02/13/2008.  Findings: Oval area of ill-defined low density in the inferior left cerebellar  hemisphere.  More inferiorly, an old infarct is noted in the left inferior cerebellar hemisphere. There is there is also a small oval area of decreased density in the inferior left basal ganglia which was lower in density in better defined previously, compatible with old lacunar infarct.  Mild patchy white matter low density in both cerebral hemispheres.  Mildly enlarged ventricles and subarachnoid spaces.  No intracranial hemorrhage or mass lesion.  Unremarkable bones and included paranasal sinuses.  IMPRESSION:  1.  Probable subacute left cerebellar hemisphere infarct. 2.  Additional old left cerebellar hemisphere infarct and old left basal ganglia lacunar infarct. 3.  Mild atrophy and mild chronic small vessel white matter ischemic changes in both cerebral hemispheres.  These results were called by telephone on 11/16/2011 at 2223 to [facility], the patient's nurse, who verbally acknowledged these results.   Original Report Authenticated By: Darrol Angel, M.D.    Mr Brain Wo Contrast  11/17/2011  *RADIOLOGY REPORT*  Clinical Data:  Mental status changes.  Disoriented.  Fall. Abnormal head CT.  MRI HEAD WITHOUT CONTRAST MRA HEAD WITHOUT CONTRAST  Technique:  Multiplanar, multiecho pulse sequences of the brain and surrounding structures were obtained without intravenous contrast. Angiographic images of the head were obtained using MRA technique without contrast.  Comparison:  Head CT 11/16/2011.  MRI 02/14/2008.  MRI HEAD  Findings:  Diffusion imaging does not show any acute or subacute infarction.  There are mild chronic small vessel changes of the pons.  There are old infarctions affecting the inferior cerebellum on the left.  No acute cerebellar infarction.  The cerebral hemispheres show mild chronic small vessel change throughout the deep and subcortical white matter, fairly typical of a age.  No cortical or large vessel territory infarction.  No mass lesion, hemorrhage, hydrocephalus or extra-axial collection.   No pituitary mass.  No inflammatory sinus disease.  No skull or skull base lesion.  IMPRESSION: No acute or subacute infarction.  Old inferior cerebellar infarction on the left.  Chronic small vessel changes of the pons in the cerebral hemispheric white matter, fairly typical for age.  MRA HEAD  Findings: Both internal carotid arteries are widely patent into the brain.  No siphon stenosis.  The anterior and middle cerebral vessels are patent without proximal stenosis, aneurysm or vascular malformation.  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  Both vertebral arteries are patent at the foramen magnum level. The right is dominant.  Both vessels are patent to the basilar. The basilar artery shows moderate atherosclerotic irregularity with stenosis of 40% in the proximal third.  Superior cerebellar and posterior cerebral vessels show flow, with the left PCA receiving most of its supply from the anterior circulation.  IMPRESSION: No major vessel occlusion.  No significant anterior circulation stenotic disease.  40% stenosis of the basilar artery in the proximal third.  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   Original Report Authenticated By: Thomasenia Sales, M.D.    Mr Mra Head/brain Wo Cm  11/17/2011  *RADIOLOGY REPORT*  Clinical Data:  Mental status changes.  Disoriented.  Fall. Abnormal head CT.  MRI HEAD WITHOUT CONTRAST MRA HEAD WITHOUT CONTRAST  Technique:  Multiplanar, multiecho pulse sequences of the brain and surrounding structures were obtained without intravenous contrast. Angiographic images of the head were obtained using MRA technique without contrast.  Comparison:  Head CT 11/16/2011.  MRI 02/14/2008.  MRI HEAD  Findings:  Diffusion imaging does not show any acute or subacute infarction.  There are mild chronic small vessel changes of the pons.  There are old infarctions affecting the  inferior cerebellum on the left.  No acute cerebellar infarction.  The cerebral hemispheres show mild chronic small vessel change throughout the deep and subcortical white matter, fairly typical of a age.  No cortical or large vessel territory infarction.  No mass lesion, hemorrhage, hydrocephalus or extra-axial collection.  No pituitary mass.  No inflammatory sinus disease.  No skull or skull base lesion.  IMPRESSION: No acute or subacute infarction.  Old inferior cerebellar infarction on the left.  Chronic small vessel changes of the pons in the cerebral hemispheric white matter, fairly typical for age.  MRA HEAD  Findings: Both internal carotid arteries are widely patent into the brain.  No siphon stenosis.  The anterior and middle cerebral vessels are patent without proximal stenosis, aneurysm or vascular malformation.  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  Both vertebral arteries are patent at the foramen magnum level. The right is dominant.  Both vessels are patent to the basilar. The basilar artery shows moderate atherosclerotic irregularity with stenosis of 40% in the proximal  third.  Superior cerebellar and posterior cerebral vessels show flow, with the left PCA receiving most of its supply from the anterior circulation.  IMPRESSION: No major vessel occlusion.  No significant anterior circulation stenotic disease.  40% stenosis of the basilar artery in the proximal third.  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   Original Report Authenticated By: Thomasenia Sales, M.D.      2-D echo done results pending    Microbiology: Recent Results (from the past 240 hour(s))  GRAM STAIN     Status: Normal   Collection Time   11/16/11  9:30 PM      Component Value Range Status Comment   Specimen Description URINE, RANDOM   Final    Special Requests ADDED 2350   Final    Gram Stain      Final    Value: CYTOSPIN SLIDE     WBC PRESENT,BOTH PMN AND MONONUCLEAR     NEGATIVE FOR BACTERIA   Report Status 11/17/2011 FINAL   Final   URINE CULTURE     Status: Normal   Collection Time   11/16/11  9:30 PM      Component Value Range Status Comment   Specimen Description URINE, CLEAN CATCH   Final    Special Requests ADDED 2350   Final    Culture  Setup Time 11/17/2011 12:52   Final    Colony Count NO GROWTH   Final    Culture NO GROWTH   Final    Report Status 11/18/2011 FINAL   Final      Labs: No results found for this or any previous visit (from the past 48 hour(s)).   HPI :[name] is a 78 y.o. female who was last seen normal at 4:30 pm, and at 5.00 pm was found laying on the floor naked by her family. She was disoriented and appeared to be trying to go to the bathroom and had fallen. She lives alone and is very independent at baseline per her family.  Found by family confused and naked about 1830. Last heard from normal 1630. Presenting to ED at 2100. CN 2-12 intact (decreased hearing), 5/5 strength throughout. Some ataxia on finger to nose. Was deemed Not to be a tPA candidate given delayed presentation and improving symptoms   HOSPITAL COURSE:  #1 subacute CVA. MRI was done for stroke workup that showed an old inferior cerebellar infarction on the left, chronic small vessel changes of the pons and cerebellar hemispheric white matter MRA showed 40% stenosis of the basilar artery the proximal third A 4 mm aneurysm from the cavernous carotid on the right The patient takes aspirin at home In the acute setting she was continued on aspirin, subsequently switched to Plavix. Lipid panel was done with HDL and LDL within normal limits, given her acute CVA she was started on Lipitor Inpatient rehabilitation was consulted but she was unable to secure 24 hrs care after DC from rehab so she was referred for rehab at [facility]. Case was briefly discussed with Dr. Pearlean Brownie prior to  discharge from hospital and he recommended close f/u in the office after DC. He agreed with switch from aspirin to plavix.   #2 hypertension Blood pressures remained elevated between 150s and 160s She was started on lisinopril She will need a repeat BMP in 2 weeks  #3 hypercalcemia   PTH  108.0 (H)  14.0 - 72.0 pg/mL  Final   Calcium, Total (  PTH)  10.7 (H)  8.4 - 10.5 mg/dL  Final     TSH wnl Vitamin D wnl Patient probably has primaryhyperparathyroidism - needs close outpatient f.u      Discharge Exam:  Blood pressure 156/89, pulse 92, temperature 97.7 F (36.5 C), temperature source Oral, resp. rate 20, height 5\' 7"  (1.702 m), weight 70.308 kg (155 lb), SpO2 100.00%.  HEENT: Normocephalic and Atraumatic, CHEST: Normal respiration, clear to auscultation bilaterally  HEART: Regular rate and rhythm; no murmurs rubs or gallops  BACK: No kyphosis or scoliosis; no CVA tenderness  ABDOMEN: Positive Bowel Sounds, soft non-tender; no masses, no organomegaly.     Time spent discharging patient 35 minutes   Signed: Wilbert Schouten 11/20/2011, 9:54 AM

## 2011-11-21 ENCOUNTER — Non-Acute Institutional Stay: Payer: Self-pay | Admitting: Family Medicine

## 2011-11-21 DIAGNOSIS — Z593 Problems related to living in residential institution: Secondary | ICD-10-CM | POA: Insufficient documentation

## 2011-11-21 NOTE — Clinical Social Work Note (Signed)
Clinical Social Work  Pt is ready for discharge today to [facility]. Facility has received discharge paperwork and is ready to admit pt. Pt and family are agreeable to discharge plan. Pt's niece, Babette Relic will complete admissions paperwork today. [facility] will provide transportation. CSW is signing off as no further needs identified.   Dede Query, MSW, Theresia Majors [phone]

## 2011-11-21 NOTE — Progress Notes (Deleted)
  Subjective:    Patient ID: [name], female    DOB: [birth year], 78 y.o.   MRN: [id number]  HPI Comments: 78 yo F with known HTN, HLD, h/o CVA, new hyperCa with elevated PTH admitted to [facility] s/p CVA (subacute left cerebellar hemisphere infarct), in need of SNF for OT/PT.  She lived alone prior to hospitalization on 11/16/11 and is very independent at baseline per her family.  Patient initially planned to go to [facility] (inpatient rehab) but due to lack of 24/7 supervision at home, was unable to be accepted.   Per report, patient was found naked, on the floor, very confused, the evening of 11/16/11 ~30 min to 1 hour after having been seen at her baseline.    MRI of the brain showed probable subacute left cerebellar hemispheric infarct as well as additional old left cerebellar hemispheric infarct and old left basal ganglia lacunar infarct. MRA of the head with no major vessel occlusion. Echocardiogram with ejection fraction 65% grade 1 diastolic dysfunction. Carotid Dopplers were negative for occlusion bilaterally.      Review of Systems     Objective:   Physical Exam        Assessment & Plan:  1. CVA -PT/OT -Statin, ACE-i, plavix  2. HTN -Monitor -Started on lisinopril whiel in hospital -F/u BMET in 1-2 weeks to recheck Cr/renal function  3. Ca -Also found to have elevated PTH -vitamin D and TSH WNL  4. Chronic pain -Tramadol PRN -Consider scheduling Tylenol if pt is needing large amount of tramadol

## 2011-11-21 NOTE — Progress Notes (Signed)
Physical Therapy Treatment Patient Details Name: [name] MRN: [id number] DOB: May 31, 1930 Today's Date: 11/21/2011 Time: 1012-1035 PT Time Calculation (min): 23 min  PT Assessment / Plan / Recommendation Comments on Treatment Session  Patient making progress towards goal. Patient planning on discharging to SNF to continue rehab prior to going home alone    Follow Up Recommendations  Post acute inpatient     Does the patient have the potential to tolerate intense rehabilitation  No, Recommend SNF  Barriers to Discharge        Equipment Recommendations  None recommended by PT;None recommended by OT    Recommendations for Other Services    Frequency Min 4X/week   Plan Discharge plan remains appropriate;Frequency remains appropriate    Precautions / Restrictions Precautions Precautions: Fall Precaution Comments: instructed student nurse that pt needs supervision when OOB   Pertinent Vitals/Pain     Mobility  Bed Mobility Bed Mobility: Not assessed Transfers Sit to Stand: 5: Supervision Stand to Sit: 5: Supervision Ambulation/Gait Ambulation/Gait Assistance: 4: Min guard Ambulation Distance (Feet): 250 Feet Assistive device: None Ambulation/Gait Assistance Details: Patient with improved ambulation this session. Patient holding therapist hand but no relying for supprt. Patient states that she "bounces" off the walls at home normally and laughed about that Gait Pattern: Step-through pattern;Decreased stride length    Exercises     PT Diagnosis:    PT Problem List:   PT Treatment Interventions:     PT Goals Acute Rehab PT Goals PT Goal: Sit to Stand - Progress: Met PT Goal: Stand to Sit - Progress: Met PT Transfer Goal: Bed to Chair/Chair to Bed - Progress: Progressing toward goal PT Goal: Ambulate - Progress: Progressing toward goal  Visit Information  Last PT Received On: 11/21/11 Assistance Needed: +1    Subjective Data      Cognition  Overall  Cognitive Status: Impaired Area of Impairment: Safety/judgement Arousal/Alertness: Awake/alert Orientation Level: Appears intact for tasks assessed Behavior During Session: [facility] for tasks performed Current Attention Level: Alternating Safety/Judgement: Decreased awareness of safety precautions    Balance  Balance Balance Assessed: Yes Dynamic Sitting Balance Dynamic Sitting - Balance Support: Feet supported Dynamic Sitting - Level of Assistance: 7: Independent Static Standing Balance Static Standing - Balance Support: No upper extremity supported Static Standing - Level of Assistance: 5: Stand by assistance Static Standing - Comment/# of Minutes: 15 Dynamic Standing Balance Dynamic Standing - Balance Support: No upper extremity supported Dynamic Standing - Level of Assistance: 5: Stand by assistance Dynamic Standing - Balance Activities: Forward lean/weight shifting;Lateral lean/weight shifting;Reaching for weighted objects;Reaching across midline;Reaching for objects Dynamic Standing - Comments: Patient worked on functional activites in standing for 15 minutes with cueing to focus on task at times. SBA for safety no LOB  End of Session PT - End of Session Equipment Utilized During Treatment: Gait belt Activity Tolerance: Patient tolerated treatment well Patient left: in chair;with call bell/phone within reach Nurse Communication: Mobility status   GP     Fredrich Birks 11/21/2011, 11:44 AM  11/21/2011 Fredrich Birks PTA [phone] pager [phone] office

## 2011-11-21 NOTE — Progress Notes (Signed)
Occupational Therapy Treatment Patient Details Name: [name] MRN: [id number] DOB: 06/16/1930 Today's Date: 11/21/2011 Time: 7829-5621 OT Time Calculation (min): 23 min  OT Assessment / Plan / Recommendation Comments on Treatment Session Pt is progressing very well with cognition, visual field attention, balance and ADL.  Remains appropriate for further rehab to address higher level executive function and safety for return home.    Follow Up Recommendations  Skilled nursing facility    Barriers to Discharge       Equipment Recommendations  None recommended by OT    Recommendations for Other Services    Frequency Min 3X/week   Plan Discharge plan remains appropriate    Precautions / Restrictions Precautions Precautions: Fall Precaution Comments: instructed student nurse that pt needs supervision when OOB   Pertinent Vitals/Pain No pain, on telemetry    ADL  Grooming: Performed;Wash/dry hands;Wash/dry face;Denture care;Teeth care;Supervision/safety Where Assessed - Grooming: Unsupported standing Lower Body Bathing: Supervision/safety Where Assessed - Lower Body Bathing: Unsupported standing Upper Body Dressing: Performed;Supervision/safety Where Assessed - Upper Body Dressing: Unsupported standing Lower Body Dressing: Performed;Supervision/safety Where Assessed - Lower Body Dressing: Unsupported sit to stand Toilet Transfer: Performed;Supervision/safety Toilet Transfer Method: Sit to Barista: Bedside commode Transfers/Ambulation Related to ADLs: supervision ADL Comments: Pt able to scan sink and locate items for self care without cues.  Sequenced all grooming and dressing tasks independently.    OT Diagnosis:    OT Problem List:   OT Treatment Interventions:     OT Goals ADL Goals Pt Will Perform Eating: with supervision;Sitting, chair;Sitting, edge of bed Pt Will Perform Grooming: with supervision;Standing at sink ADL Goal: Grooming -  Progress: Met Pt Will Perform Upper Body Bathing: with supervision;Sitting, chair;Sitting, edge of bed Pt Will Perform Lower Body Bathing: with supervision;Sit to stand from chair;Sit to stand from bed ADL Goal: Lower Body Bathing - Progress: Met Pt Will Perform Upper Body Dressing: with supervision;Sitting, bed;Sitting, chair ADL Goal: Upper Body Dressing - Progress: Met ADL Goal: Lower Body Dressing - Progress: Met Pt Will Transfer to Toilet: with supervision;Ambulation;Comfort height toilet;Regular height toilet ADL Goal: Toilet Transfer - Progress: Met Miscellaneous OT Goals Miscellaneous OT Goal #1: Pt will attend to left side 75% of time during ADLs with min verbal cueing. OT Goal: Miscellaneous Goal #1 - Progress: Met Miscellaneous OT Goal #2: Pt will demonstrate selective attention during ADL tasks with min verbal cueing to stay on task. OT Goal: Miscellaneous Goal #2 - Progress: Met  Visit Information  Last OT Received On: 11/21/11 Assistance Needed: +1 PT/OT Co-Evaluation/Treatment: Yes    Subjective Data      Prior Functioning       Cognition  Overall Cognitive Status: Impaired Area of Impairment: Safety/judgement Arousal/Alertness: Awake/alert Behavior During Session: [facility] for tasks performed Current Attention Level: Alternating Safety/Judgement: Decreased awareness of safety precautions    Mobility  Shoulder Instructions Bed Mobility Bed Mobility: Not assessed Transfers Transfers: Sit to Stand;Stand to Sit Sit to Stand: 5: Supervision;With upper extremity assist Stand to Sit: 5: Supervision;To chair/3-in-1       Exercises      Balance Balance Balance Assessed: Yes Dynamic Sitting Balance Dynamic Sitting - Balance Support: Feet supported Dynamic Sitting - Level of Assistance: 7: Independent Static Standing Balance Static Standing - Balance Support: No upper extremity supported Static Standing - Level of Assistance: 5: Stand by assistance Static  Standing - Comment/# of Minutes: 15   End of Session OT - End of Session Activity Tolerance: Patient  tolerated treatment well Patient left: in chair;with call bell/phone within reach Nurse Communication: Mobility status  GO     Evern Bio 11/21/2011, 11:33 AM [phone]

## 2011-11-21 NOTE — Progress Notes (Signed)
Agree with discharge plan  11/21/2011 Milana Kidney DPT PAGER: [phone] OFFICE: [phone]

## 2011-11-22 LAB — PTH-RELATED PEPTIDE: PTH-related peptide: 13 pg/mL — ABNORMAL LOW (ref 14–27)

## 2011-11-22 NOTE — Progress Notes (Signed)
Pt did not come to [facility]... Went to different facility.

## 2011-12-02 ENCOUNTER — Encounter: Payer: Self-pay | Admitting: Family Medicine

## 2011-12-02 ENCOUNTER — Ambulatory Visit (INDEPENDENT_AMBULATORY_CARE_PROVIDER_SITE_OTHER): Payer: Medicare Other | Admitting: Family Medicine

## 2011-12-02 VITALS — BP 138/80 | HR 88 | Temp 98.0°F | Ht 65.0 in | Wt 152.2 lb

## 2011-12-02 DIAGNOSIS — E213 Hyperparathyroidism, unspecified: Secondary | ICD-10-CM

## 2011-12-02 DIAGNOSIS — E785 Hyperlipidemia, unspecified: Secondary | ICD-10-CM

## 2011-12-02 DIAGNOSIS — I10 Essential (primary) hypertension: Secondary | ICD-10-CM

## 2011-12-02 DIAGNOSIS — I6350 Cerebral infarction due to unspecified occlusion or stenosis of unspecified cerebral artery: Secondary | ICD-10-CM

## 2011-12-02 DIAGNOSIS — Z9181 History of falling: Secondary | ICD-10-CM

## 2011-12-02 LAB — COMPREHENSIVE METABOLIC PANEL
ALT: 14 U/L (ref 0–35)
Albumin: 3.5 g/dL (ref 3.5–5.2)
Alkaline Phosphatase: 65 U/L (ref 39–117)
CO2: 29 mEq/L (ref 19–32)
Glucose, Bld: 87 mg/dL (ref 70–99)
Potassium: 4 mEq/L (ref 3.5–5.1)
Sodium: 135 mEq/L (ref 135–145)
Total Bilirubin: 0.1 mg/dL — ABNORMAL LOW (ref 0.3–1.2)
Total Protein: 6.9 g/dL (ref 6.0–8.3)

## 2011-12-02 NOTE — Assessment & Plan Note (Signed)
See overview Doing very well with reassuring exam I do feel she is at fall risk and recommend use of walker  I also feel a life alert button is a good idea-she will disc this with family Schedule f/u with Dr Pearlean Brownie  Rev hosp records and studies with pt in detail Continues statin/ ace and plavix

## 2011-12-02 NOTE — Progress Notes (Signed)
Subjective:    Patient ID: [name], female    DOB: Feb 19, 1930, 78 y.o.   MRN: [id number]  HPI Here for f/u after hosp for CVA Had L subacute cerebellar hemispheric infarct MRI/ mra showed no acute clots, but did have 40 % stenosis in basilar artery and 4 mm cavernous carotid aneurysm Was changed from asa to plavix (no side effects or problems) Went to [facility] for rehab and that went very well  No residual weakness or speech problems or other symptoms - back to baseline   She does have a roaring sound in L ear   Will need a ref to see Dr Pearlean Brownie    She was found by her family -confused and naked on the floor    Also started on lipitor- no problems with that   bp was up so ace was initiated-needs labs No problems or side effects    Also abn ca and PTH- suspect hyperparathyroid Lab Results  Component Value Date   CALCIUM 10.7* 11/18/2011   CALCIUM 10.8* 11/18/2011   PHOS 2.4 05/27/2011   she has no symptoms with that at all   Patient Active Problem List  Diagnosis  . HYPERLIPIDEMIA  . HYPERTENSION  . CVA  . DIVERTICULOSIS, COLON  . HIP PAIN  . OSTEOPENIA  . PELVIC  PAIN  . URINALYSIS, ABNORMAL  . Syncope  . RLQ abdominal pain  . Renal insufficiency  . Low TSH level  . Suprapubic abdominal pain  . Back pain, thoracic  . Scoliosis  . Degenerative disc disease  . Hyperparathyroidism  . Person living in residential institution  . Mild hyperlipidemia   Past Medical History  Diagnosis Date  . Hypertension   . Hyperlipidemia   . Osteopenia   . Diverticulosis   . DDD (degenerative disc disease)   . Chronic pain   . Stroke   . Syncope    Past Surgical History  Procedure Date  . Abdominal hysterectomy   . Tonsillectomy   . Exploratory laparotomy     2 times both neg   . Spine surgery     l3-l4 microdiscectomy   . Laminectomy 06-2003    l2-l4  . Doppler echocardiography     normal  . Carotid doppler     normal   History  Substance Use  Topics  . Smoking status: Never Smoker   . Smokeless tobacco: Never Used  . Alcohol Use: No   Family History  Problem Relation Age of Onset  . Hypertension Father   . COPD Father    Allergies  Allergen Reactions  . Celecoxib     REACTION: affects kidneys  . Codeine Nausea Only    And dizziness  . Etodolac     REACTION: Rash   Current Outpatient Prescriptions on File Prior to Visit  Medication Sig Dispense Refill  . atorvastatin (LIPITOR) 20 MG tablet Take 1 tablet (20 mg total) by mouth daily at 6 PM.  30 tablet  0  . clopidogrel (PLAVIX) 75 MG tablet Take 1 tablet (75 mg total) by mouth daily.  30 tablet  0  . lisinopril (ZESTRIL) 10 MG tablet Take 1 tablet (10 mg total) by mouth daily.  30 tablet  0  . traMADol (ULTRAM) 50 MG tablet Take 1 tablet (50 mg total) by mouth every 6 (six) hours as needed. For back pain  30 tablet  0      Review of Systems Review of Systems  Constitutional: Negative for fever,  appetite change, fatigue and unexpected weight change.  Eyes: Negative for pain and visual disturbance.  Respiratory: Negative for cough and shortness of breath.   Cardiovascular: Negative for cp or palpitations    Gastrointestinal: Negative for nausea, diarrhea and constipation.  Genitourinary: Negative for urgency and frequency.  Skin: Negative for pallor or rash   Neurological: Negative for weakness, light-headedness, numbness and headaches.  Hematological: Negative for adenopathy. Does not bruise/bleed easily.  Psychiatric/Behavioral: Negative for dysphoric mood. The patient is not nervous/anxious.         Objective:   Physical Exam  Constitutional: She is oriented to person, place, and time. She appears well-developed and well-nourished. No distress.  HENT:  Head: Normocephalic and atraumatic.  Mouth/Throat: Oropharynx is clear and moist.       Hard of hearing   Eyes: Conjunctivae normal and EOM are normal. Pupils are equal, round, and reactive to light. No  scleral icterus.  Neck: Normal range of motion. Neck supple. No JVD present. Carotid bruit is not present. No thyromegaly present.  Cardiovascular: Normal rate, regular rhythm, normal heart sounds and intact distal pulses.  Exam reveals no gallop.   Pulmonary/Chest: Effort normal and breath sounds normal. No respiratory distress. She has no wheezes.  Abdominal: Soft. Bowel sounds are normal. She exhibits no distension, no abdominal bruit and no mass. There is no tenderness.  Musculoskeletal: She exhibits no edema and no tenderness.  Lymphadenopathy:    She has no cervical adenopathy.  Neurological: She is alert and oriented to person, place, and time. She has normal reflexes. She displays no atrophy and no tremor. No cranial nerve deficit or sensory deficit. She exhibits normal muscle tone. She displays a negative Romberg sign. Coordination and gait normal.       No focal cerebellar signs Gait is wide based, however   Skin: Skin is warm and dry. No rash noted. No erythema. No pallor.  Psychiatric: She has a normal mood and affect.          Assessment & Plan:

## 2011-12-02 NOTE — Assessment & Plan Note (Signed)
With recent new cerebellar cva Is controlled with ace and no side effects Lab today  F/u 2-3 mo

## 2011-12-02 NOTE — Patient Instructions (Addendum)
We will refer you to the neurologist for stroke follow up at check out  Stay on current medicines  Blood pressure is better on 2nd check  Follow up with me in 2-3 months for visit and to re check your cholesterol and parathyroid (calcium level)  I'm so glad you are feeling better  If any headache/ dizziness or stroke symptoms - please call 911 in light of recent stroke  I think you should be using a walker for help with balance  Think about getting a life alert button for safety as well

## 2011-12-02 NOTE — Assessment & Plan Note (Signed)
Treating now with lipitor in light of cva  No side eff on statin  Will check lab at 2-3 mo f/u  Disc low sat fat diet

## 2011-12-02 NOTE — Assessment & Plan Note (Signed)
With hx of cerebellar infarct I feel she is at risk for falling , I recommend use of a walker for balance

## 2011-12-02 NOTE — Assessment & Plan Note (Addendum)
This is new , mild and asymptomatic Will re check lab at f/u and likely disc endo ref  She does have osteopenia No fragility fractures

## 2011-12-03 ENCOUNTER — Encounter: Payer: Self-pay | Admitting: *Deleted

## 2012-01-06 ENCOUNTER — Other Ambulatory Visit: Payer: Self-pay | Admitting: *Deleted

## 2012-01-06 ENCOUNTER — Other Ambulatory Visit: Payer: Self-pay

## 2012-01-06 MED ORDER — CLOPIDOGREL BISULFATE 75 MG PO TABS: 75.0000 mg | ORAL_TABLET | Freq: Every day | ORAL | Status: DC

## 2012-01-06 MED ORDER — LISINOPRIL 10 MG PO TABS: 10.0000 mg | ORAL_TABLET | Freq: Every day | ORAL | Status: DC

## 2012-01-06 MED ORDER — ATORVASTATIN CALCIUM 20 MG PO TABS: 20.0000 mg | ORAL_TABLET | Freq: Every day | ORAL | Status: DC

## 2012-01-06 NOTE — Telephone Encounter (Signed)
Pt request refills on atorvastatin,plavix and lisinopril. [facility] already has refills and med ready for pick up. Left v/m for pt to call back.

## 2012-01-06 NOTE — Telephone Encounter (Signed)
Patient notified as instructed by telephone. 

## 2012-01-06 NOTE — Telephone Encounter (Signed)
Received faxed refill request from pharmacy.  Message from pharmacy reads that patient was released from [facility] last month and Dr. Daria Pastures prescribed her these medications. Patient wants to continue on these and requested that you refill them. Last office visit 12/02/11. Is it okay to refill medications?

## 2012-01-06 NOTE — Telephone Encounter (Signed)
Please refil all for 6 months - thanks

## 2012-01-30 ENCOUNTER — Encounter: Payer: Self-pay | Admitting: Family Medicine

## 2012-02-03 ENCOUNTER — Encounter: Payer: Self-pay | Admitting: *Deleted

## 2012-03-03 ENCOUNTER — Encounter: Payer: Self-pay | Admitting: Family Medicine

## 2012-03-03 ENCOUNTER — Ambulatory Visit (INDEPENDENT_AMBULATORY_CARE_PROVIDER_SITE_OTHER): Payer: Medicare Other | Admitting: Family Medicine

## 2012-03-03 VITALS — BP 160/95 | HR 60 | Temp 97.7°F | Ht 65.0 in | Wt 152.8 lb

## 2012-03-03 DIAGNOSIS — I10 Essential (primary) hypertension: Secondary | ICD-10-CM

## 2012-03-03 DIAGNOSIS — E785 Hyperlipidemia, unspecified: Secondary | ICD-10-CM

## 2012-03-03 DIAGNOSIS — E213 Hyperparathyroidism, unspecified: Secondary | ICD-10-CM

## 2012-03-03 DIAGNOSIS — I6350 Cerebral infarction due to unspecified occlusion or stenosis of unspecified cerebral artery: Secondary | ICD-10-CM

## 2012-03-03 LAB — COMPREHENSIVE METABOLIC PANEL
Alkaline Phosphatase: 85 U/L (ref 39–117)
CO2: 29 mEq/L (ref 19–32)
Creatinine, Ser: 1 mg/dL (ref 0.4–1.2)
GFR: 60.16 mL/min (ref >60.00)
Glucose, Bld: 113 mg/dL — ABNORMAL HIGH (ref 70–99)
Total Bilirubin: 0.4 mg/dL (ref 0.3–1.2)

## 2012-03-03 LAB — LIPID PANEL
Cholesterol: 144 mg/dL (ref 0–200)
HDL: 77.4 mg/dL (ref >39.00)
LDL Cholesterol: 48 mg/dL (ref 0–99)
Triglycerides: 92 mg/dL (ref 0.0–149.0)
VLDL: 18.4 mg/dL (ref 0.0–40.0)

## 2012-03-03 MED ORDER — LISINOPRIL 10 MG PO TABS: 20.0000 mg | ORAL_TABLET | Freq: Every day | ORAL | Status: DC

## 2012-03-03 NOTE — Assessment & Plan Note (Signed)
No new symptoms  Pt refuses to use her walker even after counseling about balance and fall prevention unfortunately Inc ace due to inc bp- will keep other meds the same F/u with neuro tomorrow

## 2012-03-03 NOTE — Patient Instructions (Addendum)
I'm glad you are doing well  See the neurologist tomorrow as planned  Your blood pressure is high so we need to increase your lisinopril to 2 pills once daily (for total dose of 20 mg once daily)  Labs today  We may need to send you to an endocrinologist if parathyroid tests are abnormal -we will let you know  Follow up with me in 4-6 weeks for your blood pressure  Avoid excess salt in diet

## 2012-03-03 NOTE — Assessment & Plan Note (Signed)
Check lipids today on lipitor  Pt tolerating it well  Suspect her diet is fair

## 2012-03-03 NOTE — Assessment & Plan Note (Signed)
Asymptomatic Lab today PTH and ca and D level  Consider endo consult if needed

## 2012-03-03 NOTE — Assessment & Plan Note (Signed)
bp is up  Disc low sodium diet and more activity  Inc lisinopril to 20 mg daily - call if problems or side effects  F/u neuro tomorrow F/u here 4-6 wk

## 2012-03-03 NOTE — Progress Notes (Signed)
  Subjective:    Patient ID: [name], female    DOB: 1930-07-24, 79 y.o.   MRN: [id number]  HPI Here for f/u of several chronic issues  Last visit f/u for CVA cerebellar On ace and statin and plavix Was ref to neurol- goes for appt tomorrow No more stroke symptoms  She is not using a walker -- is being careful - she states she has not had falls or balance problems   Is feeling good   Had cataract surgery - that went well / and getting new glasses   On lipitor for cholesterol and needs that checked No problems or side effects  She does watch her diet for sat fats   bp is up today  No cp or palpitations or headaches or edema  No side effects to medicines  She took her medicines today  She did have [facility] at the church -- ate some salty food  BP Readings from Last 3 Encounters:  03/03/12 172/90  12/02/11 138/80  11/21/11 136/94      Hyperparathyroid Found in hosp/ mild and asymptomatic Lab Results  Component Value Date   CALCIUM 10.4 12/02/2011   PHOS 2.4 05/27/2011   last pth was 108 in oct  No symptoms No muscle cramps  Review of Systems Review of Systems  Constitutional: Negative for fever, appetite change, fatigue and unexpected weight change.  Eyes: Negative for pain and visual disturbance.  Respiratory: Negative for cough and shortness of breath.   Cardiovascular: Negative for cp or palpitations    Gastrointestinal: Negative for nausea, diarrhea and constipation.  Genitourinary: Negative for urgency and frequency.  Skin: Negative for pallor or rash   Neurological: Negative for weakness, light-headedness, numbness and headaches. pos for generally poor balance Hematological: Negative for adenopathy. Does not bruise/bleed easily.  Psychiatric/Behavioral: Negative for dysphoric mood. The patient is not nervous/anxious.         Objective:   Physical Exam  Constitutional: She appears well-developed and well-nourished. No distress.  HENT:  Head:  Normocephalic and atraumatic.  Nose: Nose normal.  Mouth/Throat: Oropharynx is clear and moist.  Eyes: Conjunctivae normal and EOM are normal. Pupils are equal, round, and reactive to light. No scleral icterus.       No nystagmus   Neck: Normal range of motion. Neck supple. No JVD present. Carotid bruit is not present. No thyromegaly present.  Cardiovascular: Normal rate, regular rhythm, normal heart sounds and intact distal pulses.  Exam reveals no gallop.   Pulmonary/Chest: Effort normal and breath sounds normal. No respiratory distress. She has no wheezes. She exhibits no tenderness.  Abdominal: Soft. Bowel sounds are normal. She exhibits no distension, no abdominal bruit and no mass. There is no tenderness.  Musculoskeletal: She exhibits no edema and no tenderness.  Lymphadenopathy:    She has no cervical adenopathy.  Neurological: She is alert. She has normal reflexes. She displays no tremor. No cranial nerve deficit. She exhibits normal muscle tone. Gait abnormal. Coordination normal.       Gait seems mildly unsteady, wide based   Skin: Skin is warm and dry. No rash noted. No erythema. No pallor.  Psychiatric: She has a normal mood and affect.          Assessment & Plan:

## 2012-03-05 ENCOUNTER — Other Ambulatory Visit: Payer: Self-pay | Admitting: *Deleted

## 2012-03-05 MED ORDER — ATORVASTATIN CALCIUM 20 MG PO TABS: 20.0000 mg | ORAL_TABLET | Freq: Every day | ORAL | Status: DC

## 2012-03-05 NOTE — Telephone Encounter (Signed)
Pt's ins. changes on 03/14/12 and needs [facility] because ins. doesn't cover [facility]., [facility] to [facility] per pt request

## 2012-03-16 ENCOUNTER — Encounter: Payer: Self-pay | Admitting: Family Medicine

## 2012-03-16 ENCOUNTER — Ambulatory Visit (INDEPENDENT_AMBULATORY_CARE_PROVIDER_SITE_OTHER)
Admission: RE | Admit: 2012-03-16 | Discharge: 2012-03-16 | Disposition: A | Discharge status: Home / Self Care | Payer: Medicare Other | Source: Ambulatory Visit | Attending: Family Medicine | Admitting: Family Medicine

## 2012-03-16 ENCOUNTER — Ambulatory Visit (INDEPENDENT_AMBULATORY_CARE_PROVIDER_SITE_OTHER): Payer: Medicare Other | Admitting: Family Medicine

## 2012-03-16 VITALS — BP 118/68 | HR 78 | Temp 97.5°F | Ht 65.0 in | Wt 149.2 lb

## 2012-03-16 DIAGNOSIS — M79609 Pain in unspecified limb: Secondary | ICD-10-CM

## 2012-03-16 DIAGNOSIS — R52 Pain, unspecified: Secondary | ICD-10-CM

## 2012-03-16 DIAGNOSIS — M79605 Pain in left leg: Secondary | ICD-10-CM

## 2012-03-16 DIAGNOSIS — R109 Unspecified abdominal pain: Secondary | ICD-10-CM | POA: Insufficient documentation

## 2012-03-16 DIAGNOSIS — R10A2 Flank pain, left side: Secondary | ICD-10-CM

## 2012-03-16 LAB — POCT URINALYSIS DIPSTICK
Nitrite, UA: NEGATIVE
pH, UA: 6

## 2012-03-16 LAB — POCT UA - MICROSCOPIC ONLY
Casts, Ur, LPF, POC: 0
Crystals, Ur, HPF, POC: 0
Yeast, UA: 0

## 2012-03-16 NOTE — Assessment & Plan Note (Signed)
?   Radicular from back/ or poss rel to hip  xrays today and make plan  Pt has tramadol for pain prn  Enc walking and stretching  Reassuring exam

## 2012-03-16 NOTE — Progress Notes (Signed)
Subjective:    Patient ID: [name], female    DOB: 1930-06-03, 79 y.o.   MRN: [id number]  HPI Here for back and leg pain  Started on sat night -- started with leg pain and then left low back pain -- got really bad and then improved after a pain pill  Some of the pain was in her L pelvic area also-but not tender there Hurts a bit to walk with L foot strike Then happened again last night  Is ok today   No weakness or numbness in leg or foot   This was worse than she was before she had her last back surgery   No urinary symptoms  No fever   Sees Dr Ophelia Charter for her back - has not been there for a while   Patient Active Problem List  Diagnosis  . HYPERLIPIDEMIA  . HYPERTENSION  . CVA  . DIVERTICULOSIS, COLON  . HIP PAIN  . OSTEOPENIA  . Syncope  . RLQ abdominal pain  . Renal insufficiency  . Suprapubic abdominal pain  . Back pain, thoracic  . Scoliosis  . Degenerative disc disease  . Hyperparathyroidism  . Mild hyperlipidemia  . At risk for falling  . Acute left flank pain  . Left leg pain   Past Medical History  Diagnosis Date  . Hypertension   . Hyperlipidemia   . Osteopenia   . Diverticulosis   . DDD (degenerative disc disease)   . Chronic pain   . Stroke   . Syncope    Past Surgical History  Procedure Date  . Abdominal hysterectomy   . Tonsillectomy   . Exploratory laparotomy     2 times both neg   . Spine surgery     l3-l4 microdiscectomy   . Laminectomy 06-2003    l2-l4  . Doppler echocardiography     normal  . Carotid doppler     normal   History  Substance Use Topics  . Smoking status: Never Smoker   . Smokeless tobacco: Never Used  . Alcohol Use: No   Family History  Problem Relation Age of Onset  . Hypertension Father   . COPD Father    Allergies  Allergen Reactions  . Celecoxib     REACTION: affects kidneys  . Codeine Nausea Only    And dizziness  . Etodolac     REACTION: Rash   Current Outpatient Prescriptions on File  Prior to Visit  Medication Sig Dispense Refill  . Ascorbic Acid (VITAMIN C PO) Take 1 tablet by mouth.      Marland Kitchen atorvastatin (LIPITOR) 20 MG tablet Take 1 tablet (20 mg total) by mouth daily at 6 PM.  30 tablet  5  . calcium-vitamin D (OSCAL WITH D) 250-125 MG-UNIT per tablet Take 1 tablet by mouth daily.      . Cholecalciferol (VITAMIN D PO) Take 1 tablet by mouth daily.      . clopidogrel (PLAVIX) 75 MG tablet Take 1 tablet (75 mg total) by mouth daily.  30 tablet  5  . fish oil-omega-3 fatty acids 1000 MG capsule Take 1 g by mouth daily.      Marland Kitchen lisinopril (ZESTRIL) 10 MG tablet Take 2 tablets (20 mg total) by mouth daily.  60 tablet  5  . traMADol (ULTRAM) 50 MG tablet Take 1 tablet (50 mg total) by mouth every 6 (six) hours as needed. For back pain  30 tablet  0      Review  of Systems Review of Systems  Constitutional: Negative for fever, appetite change, fatigue and unexpected weight change.  Eyes: Negative for pain and visual disturbance.  Respiratory: Negative for cough and shortness of breath.   Cardiovascular: Negative for cp or palpitations    Gastrointestinal: Negative for nausea, diarrhea and constipation.  Genitourinary: Negative for urgency and frequency.  Skin: Negative for pallor or rash   MSK pos for low back and leg pain on left - rad to her pelvic area  Neurological: Negative for weakness, light-headedness, numbness and headaches.  Hematological: Negative for adenopathy. Does not bruise/bleed easily.  Psychiatric/Behavioral: Negative for dysphoric mood. The patient is not nervous/anxious.         Objective:   Physical Exam  Constitutional: She appears well-developed and well-nourished. No distress.  HENT:  Head: Normocephalic and atraumatic.  Eyes: Conjunctivae normal and EOM are normal. Pupils are equal, round, and reactive to light. No scleral icterus.  Neck: Normal range of motion. Neck supple.  Cardiovascular: Normal rate and regular rhythm.    Pulmonary/Chest: Effort normal and breath sounds normal.  Abdominal: Soft. Bowel sounds are normal. She exhibits no distension and no mass. There is no tenderness. There is no rebound and no guarding.       No suprapubic tenderness or fullness    Musculoskeletal: She exhibits tenderness. She exhibits no edema.       No LS or para lumbar tenderness Mild trochanteric tenderness Pain to flex spine over 90 deg / nl ext Nl rom of hip   No cva tenderness   Lymphadenopathy:    She has no cervical adenopathy.  Neurological: She is alert. She has normal reflexes. She displays no atrophy and no tremor. No cranial nerve deficit or sensory deficit. She exhibits normal muscle tone. Coordination and gait normal.  Skin: Skin is warm and dry. No rash noted. No erythema. No pallor.  Psychiatric: She has a normal mood and affect.          Assessment & Plan:

## 2012-03-16 NOTE — Assessment & Plan Note (Signed)
Flank/ low back - rad to L lower abd / pelvic area  LS xray (hx of radicular dz and surg in the past) and L hip (for groin pain )  Exam is reassuring ua has some leukocytes-will cx and tx based on results

## 2012-03-16 NOTE — Patient Instructions (Addendum)
xrays of hip and low back today - we will get you results and plan tomorrow I am also sending your urine for culture to look for infection  Let me know in the meantime if pain worsens

## 2012-03-17 LAB — URINE CULTURE

## 2012-03-18 ENCOUNTER — Telehealth: Payer: Self-pay | Admitting: Family Medicine

## 2012-03-18 DIAGNOSIS — M79605 Pain in left leg: Secondary | ICD-10-CM

## 2012-03-18 DIAGNOSIS — M51369 Other intervertebral disc degeneration, lumbar region without mention of lumbar back pain or lower extremity pain: Secondary | ICD-10-CM

## 2012-03-18 DIAGNOSIS — M5136 Other intervertebral disc degeneration, lumbar region: Secondary | ICD-10-CM

## 2012-03-18 NOTE — Telephone Encounter (Signed)
I think we need to get her into orthopedics as soon as we can  I am doing the referral now - and [name] will be calling her  Tell her to try taking 2 ultram (tramadol) at a time up to every 8 hours and let me know if this does not help more

## 2012-03-18 NOTE — Telephone Encounter (Signed)
Message copied by Judy Pimple on Wed Mar 18, 2012  5:26 PM ------      Message from: Shon Millet      Created: Wed Mar 18, 2012  2:10 PM       Pt notified of xray results and she advise me that her pain is getting worse, its really bad at night and she hasn't been able to get a good night sleep and now her legs are starting to hurt too. Pt said the pain meds she have dont really help with the pain, they make the pain less sever but they dont relieve the pain

## 2012-03-19 NOTE — Telephone Encounter (Signed)
Patient advised.

## 2012-05-05 ENCOUNTER — Other Ambulatory Visit (INDEPENDENT_AMBULATORY_CARE_PROVIDER_SITE_OTHER): Payer: Medicare Other

## 2012-05-05 DIAGNOSIS — E213 Hyperparathyroidism, unspecified: Secondary | ICD-10-CM

## 2012-05-06 LAB — PTH, INTACT AND CALCIUM: PTH: 76.9 pg/mL — ABNORMAL HIGH (ref 14.0–72.0)

## 2012-05-07 ENCOUNTER — Encounter: Payer: Self-pay | Admitting: *Deleted

## 2012-06-16 ENCOUNTER — Other Ambulatory Visit: Payer: Self-pay

## 2012-06-16 MED ORDER — TRAMADOL HCL 50 MG PO TABS: 50.0000 mg | ORAL_TABLET | Freq: Four times a day (QID) | ORAL | Status: DC | PRN

## 2012-06-16 NOTE — Telephone Encounter (Signed)
Pt left note requesting refill tramadol to [facility].Please advise.

## 2012-06-16 NOTE — Telephone Encounter (Signed)
Will refill electronically  

## 2012-06-24 ENCOUNTER — Other Ambulatory Visit: Payer: Self-pay | Admitting: *Deleted

## 2012-06-24 ENCOUNTER — Other Ambulatory Visit: Payer: Self-pay | Admitting: Family Medicine

## 2012-06-24 MED ORDER — ATORVASTATIN CALCIUM 20 MG PO TABS: 20.0000 mg | ORAL_TABLET | Freq: Every day | ORAL | Status: DC

## 2012-07-13 ENCOUNTER — Other Ambulatory Visit: Payer: Self-pay | Admitting: Family Medicine

## 2012-07-13 NOTE — Telephone Encounter (Signed)
Please refill for 3 mo  

## 2012-07-13 NOTE — Telephone Encounter (Signed)
done

## 2012-07-13 NOTE — Telephone Encounter (Signed)
Electronic refill request, please advise  

## 2012-08-19 ENCOUNTER — Telehealth: Payer: Self-pay

## 2012-08-19 NOTE — Telephone Encounter (Signed)
Pt left note needs refill on Tramadol to [facility]. Spoke with Marchelle Folks at [facility] and pt has available refill which [facility] will get ready for pt to pick up. Pt notified.

## 2012-09-14 ENCOUNTER — Other Ambulatory Visit: Payer: Self-pay | Admitting: Family Medicine

## 2012-09-14 NOTE — Telephone Encounter (Signed)
Electronic refill request, no recent/future appt., please advise  

## 2012-09-14 NOTE — Telephone Encounter (Signed)
Please schedule f/u in 6 mo and refill until then, thanks 

## 2012-09-15 NOTE — Telephone Encounter (Signed)
Left voicemail requesting pt to call office 

## 2012-09-15 NOTE — Telephone Encounter (Signed)
Pt left me voicemail returning my call  Called pt twice and both times line was busy, will try to call back

## 2012-09-16 NOTE — Telephone Encounter (Signed)
F/u appt scheduled and med refilled until then 

## 2012-09-17 ENCOUNTER — Telehealth: Payer: Self-pay

## 2012-09-17 NOTE — Telephone Encounter (Signed)
Pt did not think has refills on Tramadol and wants med set up on auto refill. Spoke with pharmacist at [facility]; pt does have a refill that is being prepared for pick up. Control level of Tramadol does not allow for auto refill. Pt voiced understanding.

## 2012-10-12 ENCOUNTER — Other Ambulatory Visit: Payer: Self-pay | Admitting: Family Medicine

## 2012-10-13 ENCOUNTER — Other Ambulatory Visit: Payer: Self-pay | Admitting: Family Medicine

## 2012-10-13 NOTE — Telephone Encounter (Signed)
Electronic refill request, please advise  

## 2012-10-13 NOTE — Telephone Encounter (Signed)
Rx called in as prescribed 

## 2012-10-13 NOTE — Telephone Encounter (Signed)
Px written for call in   

## 2012-11-09 ENCOUNTER — Other Ambulatory Visit: Payer: Self-pay | Admitting: Family Medicine

## 2012-11-12 ENCOUNTER — Ambulatory Visit (INDEPENDENT_AMBULATORY_CARE_PROVIDER_SITE_OTHER): Payer: Medicare Other

## 2012-11-12 DIAGNOSIS — Z23 Encounter for immunization: Secondary | ICD-10-CM

## 2012-12-10 ENCOUNTER — Other Ambulatory Visit: Payer: Self-pay | Admitting: Family Medicine

## 2012-12-10 MED ORDER — ATORVASTATIN CALCIUM 20 MG PO TABS: ORAL_TABLET | ORAL | Status: DC

## 2012-12-10 MED ORDER — CLOPIDOGREL BISULFATE 75 MG PO TABS: ORAL_TABLET | ORAL | Status: DC

## 2012-12-10 MED ORDER — LISINOPRIL 10 MG PO TABS: ORAL_TABLET | ORAL | Status: DC

## 2012-12-10 NOTE — Telephone Encounter (Signed)
Received fax saying pt wants 90 day supplies of meds, done

## 2012-12-23 ENCOUNTER — Encounter: Payer: Self-pay | Admitting: *Deleted

## 2012-12-23 NOTE — Telephone Encounter (Signed)
Encounter opened in error

## 2013-01-12 ENCOUNTER — Other Ambulatory Visit: Payer: Self-pay | Admitting: Family Medicine

## 2013-01-13 NOTE — Telephone Encounter (Signed)
Rx called in as prescribed 

## 2013-01-13 NOTE — Telephone Encounter (Signed)
Px written for call in   

## 2013-01-13 NOTE — Telephone Encounter (Signed)
Electronic refill request, please advise  

## 2013-02-02 ENCOUNTER — Encounter: Payer: Self-pay | Admitting: Family Medicine

## 2013-02-05 ENCOUNTER — Encounter: Payer: Self-pay | Admitting: *Deleted

## 2013-03-16 ENCOUNTER — Encounter: Payer: Self-pay | Admitting: Radiology

## 2013-03-16 ENCOUNTER — Ambulatory Visit: Payer: Medicare Other | Admitting: Family Medicine

## 2013-03-17 ENCOUNTER — Ambulatory Visit (INDEPENDENT_AMBULATORY_CARE_PROVIDER_SITE_OTHER): Payer: Medicare Other | Admitting: Family Medicine

## 2013-03-17 ENCOUNTER — Encounter: Payer: Self-pay | Admitting: Family Medicine

## 2013-03-17 VITALS — BP 132/88 | HR 60 | Temp 97.6°F | Ht 65.0 in | Wt 150.5 lb

## 2013-03-17 DIAGNOSIS — E213 Hyperparathyroidism, unspecified: Secondary | ICD-10-CM

## 2013-03-17 DIAGNOSIS — E785 Hyperlipidemia, unspecified: Secondary | ICD-10-CM

## 2013-03-17 DIAGNOSIS — I10 Essential (primary) hypertension: Secondary | ICD-10-CM

## 2013-03-17 LAB — CBC WITH DIFFERENTIAL/PLATELET
BASOS ABS: 0 10*3/uL (ref 0.0–0.1)
BASOS PCT: 0.4 % (ref 0.0–3.0)
EOS ABS: 0.1 10*3/uL (ref 0.0–0.7)
Eosinophils Relative: 1.6 % (ref 0.0–5.0)
HCT: 40.8 % (ref 36.0–46.0)
Hemoglobin: 13.3 g/dL (ref 12.0–15.0)
LYMPHS PCT: 37.1 % (ref 12.0–46.0)
Lymphs Abs: 2.3 10*3/uL (ref 0.7–4.0)
MCHC: 32.7 g/dL (ref 30.0–36.0)
MCV: 99.4 fl (ref 78.0–100.0)
Monocytes Absolute: 0.6 10*3/uL (ref 0.1–1.0)
Monocytes Relative: 9.4 % (ref 3.0–12.0)
NEUTROS PCT: 51.5 % (ref 43.0–77.0)
Neutro Abs: 3.1 10*3/uL (ref 1.4–7.7)
Platelets: 230 10*3/uL (ref 150.0–400.0)
RBC: 4.11 Mil/uL (ref 3.87–5.11)
RDW: 13.6 % (ref 11.5–14.6)
WBC: 6.1 10*3/uL (ref 4.5–10.5)

## 2013-03-17 MED ORDER — LISINOPRIL 10 MG PO TABS: ORAL_TABLET | ORAL | Status: DC

## 2013-03-17 MED ORDER — CLOPIDOGREL BISULFATE 75 MG PO TABS: ORAL_TABLET | ORAL | Status: DC

## 2013-03-17 NOTE — Progress Notes (Signed)
Pre-visit discussion using our clinic review tool. No additional management support is needed unless otherwise documented below in the visit note.  

## 2013-03-17 NOTE — Progress Notes (Signed)
Subjective:    Patient ID: [name], female    DOB: 1930-10-08, 80 y.o.   MRN: [id number]  HPI Here for f/u of chronic medical problems   Is worn out today- did some heavy gardening yesterday- chopping up a dead tree  Can feel it in her back  Also a little runny nose and cough - was windy   She has been taking good care of herself  Eats a fairly healthy diet overall   Saw orthopedics -does not want to operate on her -esp in light of her prev stroke No further stroke symptoms   Wt is stable with bmi of 25  bp is stable today  No cp or palpitations or headaches or edema  No side effects to medicines  BP Readings from Last 3 Encounters:  03/17/13 132/88  03/16/12 118/68  03/03/12 160/95     Hyperlipidemia Due for labs Lab Results  Component Value Date   CHOL 144 03/03/2012   HDL 77.40 03/03/2012   LDLCALC 48 03/03/2012   LDLDIRECT 122.1 05/21/2011   TRIG 92.0 03/03/2012   CHOLHDL 2 03/03/2012    Diet- is good overall  She does try to stay away from fatty foods    Hyperparathyroid Lab Results  Component Value Date   PTH 76.9* 05/05/2012   CALCIUM 10.3 05/05/2012   PHOS 2.9 05/05/2012    Chronic back pain Takes tramadol  Has to renew controlled substance agreement   Patient Active Problem List   Diagnosis Date Noted  . Acute left flank pain 03/16/2012  . Left leg pain 03/16/2012  . Mild hyperlipidemia 12/02/2011  . At risk for falling 12/02/2011  . Hyperparathyroidism 11/20/2011  . Suprapubic abdominal pain 09/30/2011  . Back pain, thoracic 09/30/2011  . Scoliosis 09/30/2011  . Degenerative disc disease, lumbar 09/30/2011  . Renal insufficiency 05/27/2011  . RLQ abdominal pain 01/21/2011  . Syncope 12/05/2010  . CVA 02/26/2008  . HIP PAIN 12/16/2006  . HYPERLIPIDEMIA 05/12/2006  . HYPERTENSION 05/12/2006  . DIVERTICULOSIS, COLON 05/12/2006  . OSTEOPENIA 05/12/2006   Past Medical History  Diagnosis Date  . Hypertension   . Hyperlipidemia   .  Osteopenia   . Diverticulosis   . DDD (degenerative disc disease)   . Chronic pain   . Stroke   . Syncope    Past Surgical History  Procedure Laterality Date  . Abdominal hysterectomy    . Tonsillectomy    . Exploratory laparotomy      2 times both neg   . Spine surgery      l3-l4 microdiscectomy   . Laminectomy  06-2003    l2-l4  . Doppler echocardiography      normal  . Carotid doppler      normal   History  Substance Use Topics  . Smoking status: Never Smoker   . Smokeless tobacco: Never Used  . Alcohol Use: No   Family History  Problem Relation Age of Onset  . Hypertension Father   . COPD Father    Allergies  Allergen Reactions  . Celecoxib     REACTION: affects kidneys  . Codeine Nausea Only    And dizziness  . Etodolac     REACTION: Rash   Current Outpatient Prescriptions on File Prior to Visit  Medication Sig Dispense Refill  . Ascorbic Acid (VITAMIN C PO) Take 1 tablet by mouth.      . calcium-vitamin D (OSCAL WITH D) 250-125 MG-UNIT per tablet Take 1 tablet by  mouth daily.      . Cholecalciferol (VITAMIN D PO) Take 1 tablet by mouth daily.      . clopidogrel (PLAVIX) 75 MG tablet TAKE 1 TABLET BY MOUTH EVERY DAY  90 tablet  1  . fish oil-omega-3 fatty acids 1000 MG capsule Take 1 g by mouth daily.      Marland Kitchen. lisinopril (PRINIVIL,ZESTRIL) 10 MG tablet TAKE 2 TABLETS BY MOUTH DAILY  180 tablet  1  . traMADol (ULTRAM) 50 MG tablet TAKE 1 TABLET BY MOUTH EVERY 6 HOURS AS NEEDED FOR PAIN  30 tablet  2   No current facility-administered medications on file prior to visit.     Review of Systems Review of Systems  Constitutional: Negative for fever, appetite change, fatigue and unexpected weight change.  Eyes: Negative for pain and visual disturbance.  Respiratory: Negative for cough and shortness of breath.   Cardiovascular: Negative for cp or palpitations    Gastrointestinal: Negative for nausea, diarrhea and constipation.  Genitourinary: Negative for  urgency and frequency.  Skin: Negative for pallor or rash   MSK pos for chronic back pain  Neurological: Negative for weakness, light-headedness, numbness and headaches.  Hematological: Negative for adenopathy. Does not bruise/bleed easily.  Psychiatric/Behavioral: Negative for dysphoric mood. The patient is not nervous/anxious.         Objective:   Physical Exam  Constitutional: She appears well-developed and well-nourished. No distress.  HENT:  Head: Normocephalic and atraumatic.  Right Ear: External ear normal.  Left Ear: External ear normal.  Mouth/Throat: Oropharynx is clear and moist.  Hard of hearing   Eyes: Conjunctivae and EOM are normal. Pupils are equal, round, and reactive to light. No scleral icterus.  Neck: Normal range of motion. Neck supple. No JVD present. Carotid bruit is not present. No thyromegaly present.  Cardiovascular: Normal rate, regular rhythm, normal heart sounds and intact distal pulses.  Exam reveals no gallop.   Pulmonary/Chest: Effort normal and breath sounds normal. No respiratory distress. She has no wheezes. She exhibits no tenderness.  Abdominal: Soft. Bowel sounds are normal. She exhibits no distension, no abdominal bruit and no mass. There is no tenderness.  Musculoskeletal: Normal range of motion. She exhibits no edema and no tenderness.  Lymphadenopathy:    She has no cervical adenopathy.  Neurological: She is alert. She has normal reflexes. No cranial nerve deficit. She exhibits normal muscle tone. Coordination normal.  Skin: Skin is warm and dry. No rash noted. No erythema. No pallor.  Psychiatric: She has a normal mood and affect.          Assessment & Plan:

## 2013-03-17 NOTE — Patient Instructions (Signed)
Take care of yourself  Stay active and stay safe  Labs today  Schedule annual exam with labs prior in 1 year  You are due for a tetanus shot - and medicare does not pay for that - so consider going to the health department

## 2013-03-18 LAB — COMPREHENSIVE METABOLIC PANEL
ALBUMIN: 4.1 g/dL (ref 3.5–5.2)
ALT: 18 U/L (ref 0–35)
AST: 26 U/L (ref 0–37)
Alkaline Phosphatase: 82 U/L (ref 39–117)
BUN: 13 mg/dL (ref 6–23)
CALCIUM: 10.8 mg/dL — AB (ref 8.4–10.5)
CHLORIDE: 106 meq/L (ref 96–112)
CO2: 28 mEq/L (ref 19–32)
Creatinine, Ser: 0.9 mg/dL (ref 0.4–1.2)
GFR: 64.7 mL/min (ref >60.00)
Glucose, Bld: 90 mg/dL (ref 70–99)
POTASSIUM: 4 meq/L (ref 3.5–5.1)
Sodium: 139 mEq/L (ref 135–145)
Total Bilirubin: 0.6 mg/dL (ref 0.3–1.2)
Total Protein: 7.4 g/dL (ref 6.0–8.3)

## 2013-03-18 LAB — LIPID PANEL
Cholesterol: 220 mg/dL — ABNORMAL HIGH (ref 0–200)
HDL: 83.2 mg/dL (ref >39.00)
TRIGLYCERIDES: 80 mg/dL (ref 0.0–149.0)
Total CHOL/HDL Ratio: 3
VLDL: 16 mg/dL (ref 0.0–40.0)

## 2013-03-18 LAB — VITAMIN D 25 HYDROXY (VIT D DEFICIENCY, FRACTURES): Vit D, 25-Hydroxy: 45 ng/mL (ref 30–89)

## 2013-03-18 LAB — TSH: TSH: 0.7 u[IU]/mL (ref 0.35–5.50)

## 2013-03-18 LAB — PTH, INTACT AND CALCIUM
CALCIUM: 11.1 mg/dL — AB (ref 8.4–10.5)
PTH: 63.8 pg/mL (ref 14.0–72.0)

## 2013-03-18 LAB — LDL CHOLESTEROL, DIRECT: Direct LDL: 121 mg/dL

## 2013-03-18 NOTE — Assessment & Plan Note (Signed)
Lab today This has been stable No symptoms

## 2013-03-18 NOTE — Assessment & Plan Note (Signed)
Lab today  Off lipitor now  Rev low sat fat diet

## 2013-03-18 NOTE — Assessment & Plan Note (Signed)
BP: 132/88 mmHg  bp in fair control at this time  No changes needed Disc lifstyle change with low sodium diet and exercise   Lab today

## 2013-03-19 ENCOUNTER — Encounter: Payer: Self-pay | Admitting: *Deleted

## 2013-03-19 ENCOUNTER — Telehealth: Payer: Self-pay | Admitting: Family Medicine

## 2013-03-19 NOTE — Telephone Encounter (Signed)
Relevant patient education mailed to patient.  

## 2013-04-13 ENCOUNTER — Other Ambulatory Visit: Payer: Self-pay | Admitting: Family Medicine

## 2013-04-13 NOTE — Telephone Encounter (Signed)
Rx called in as prescribed 

## 2013-04-13 NOTE — Telephone Encounter (Signed)
Electronic refill request, please advise  

## 2013-04-13 NOTE — Telephone Encounter (Signed)
Px written for call in   

## 2013-04-14 ENCOUNTER — Emergency Department (HOSPITAL_COMMUNITY): Payer: Medicare Other

## 2013-04-14 ENCOUNTER — Telehealth: Payer: Self-pay | Admitting: Family Medicine

## 2013-04-14 ENCOUNTER — Emergency Department (HOSPITAL_COMMUNITY)
Admission: EM | Admit: 2013-04-14 | Discharge: 2013-04-14 | Disposition: A | Discharge status: Home / Self Care | Payer: Medicare Other | Attending: Emergency Medicine | Admitting: Emergency Medicine

## 2013-04-14 ENCOUNTER — Encounter (HOSPITAL_COMMUNITY): Payer: Self-pay | Admitting: Emergency Medicine

## 2013-04-14 DIAGNOSIS — M899 Disorder of bone, unspecified: Secondary | ICD-10-CM | POA: Insufficient documentation

## 2013-04-14 DIAGNOSIS — Z8719 Personal history of other diseases of the digestive system: Secondary | ICD-10-CM | POA: Insufficient documentation

## 2013-04-14 DIAGNOSIS — I10 Essential (primary) hypertension: Secondary | ICD-10-CM | POA: Insufficient documentation

## 2013-04-14 DIAGNOSIS — K529 Noninfective gastroenteritis and colitis, unspecified: Secondary | ICD-10-CM

## 2013-04-14 DIAGNOSIS — M949 Disorder of cartilage, unspecified: Secondary | ICD-10-CM

## 2013-04-14 DIAGNOSIS — IMO0002 Reserved for concepts with insufficient information to code with codable children: Secondary | ICD-10-CM | POA: Insufficient documentation

## 2013-04-14 DIAGNOSIS — Z8673 Personal history of transient ischemic attack (TIA), and cerebral infarction without residual deficits: Secondary | ICD-10-CM | POA: Insufficient documentation

## 2013-04-14 DIAGNOSIS — G8929 Other chronic pain: Secondary | ICD-10-CM | POA: Insufficient documentation

## 2013-04-14 DIAGNOSIS — E785 Hyperlipidemia, unspecified: Secondary | ICD-10-CM | POA: Insufficient documentation

## 2013-04-14 DIAGNOSIS — K5289 Other specified noninfective gastroenteritis and colitis: Secondary | ICD-10-CM | POA: Insufficient documentation

## 2013-04-14 DIAGNOSIS — Z79899 Other long term (current) drug therapy: Secondary | ICD-10-CM | POA: Insufficient documentation

## 2013-04-14 LAB — COMPREHENSIVE METABOLIC PANEL
ALT: 14 U/L (ref 0–35)
AST: 20 U/L (ref 0–37)
Albumin: 3.6 g/dL (ref 3.5–5.2)
Alkaline Phosphatase: 81 U/L (ref 39–117)
BILIRUBIN TOTAL: 0.5 mg/dL (ref 0.3–1.2)
BUN: 20 mg/dL (ref 6–23)
CHLORIDE: 103 meq/L (ref 96–112)
CO2: 23 mEq/L (ref 19–32)
Calcium: 10.4 mg/dL (ref 8.4–10.5)
Creatinine, Ser: 1 mg/dL (ref 0.50–1.10)
GFR calc Af Amer: 60 mL/min — ABNORMAL LOW (ref >90)
GFR, EST NON AFRICAN AMERICAN: 51 mL/min — AB (ref >90)
Glucose, Bld: 139 mg/dL — ABNORMAL HIGH (ref 70–99)
POTASSIUM: 4 meq/L (ref 3.7–5.3)
SODIUM: 142 meq/L (ref 137–147)
Total Protein: 7.3 g/dL (ref 6.0–8.3)

## 2013-04-14 LAB — CBC WITH DIFFERENTIAL/PLATELET
BASOS ABS: 0 10*3/uL (ref 0.0–0.1)
Basophils Relative: 0 % (ref 0–1)
Eosinophils Absolute: 0 10*3/uL (ref 0.0–0.7)
Eosinophils Relative: 0 % (ref 0–5)
HCT: 40.9 % (ref 36.0–46.0)
Hemoglobin: 14.1 g/dL (ref 12.0–15.0)
Lymphocytes Relative: 3 % — ABNORMAL LOW (ref 12–46)
Lymphs Abs: 0.3 10*3/uL — ABNORMAL LOW (ref 0.7–4.0)
MCH: 32.9 pg (ref 26.0–34.0)
MCHC: 34.5 g/dL (ref 30.0–36.0)
MCV: 95.6 fL (ref 78.0–100.0)
Monocytes Absolute: 0.5 10*3/uL (ref 0.1–1.0)
Monocytes Relative: 5 % (ref 3–12)
NEUTROS ABS: 9.4 10*3/uL — AB (ref 1.7–7.7)
Neutrophils Relative %: 92 % — ABNORMAL HIGH (ref 43–77)
Platelets: 215 10*3/uL (ref 150–400)
RBC: 4.28 MIL/uL (ref 3.87–5.11)
RDW: 13 % (ref 11.5–15.5)
WBC: 10.2 10*3/uL (ref 4.0–10.5)

## 2013-04-14 LAB — URINE MICROSCOPIC-ADD ON

## 2013-04-14 LAB — URINALYSIS, ROUTINE W REFLEX MICROSCOPIC
Bilirubin Urine: NEGATIVE
Glucose, UA: NEGATIVE mg/dL
Ketones, ur: NEGATIVE mg/dL
Leukocytes, UA: NEGATIVE
Nitrite: NEGATIVE
PROTEIN: NEGATIVE mg/dL
Specific Gravity, Urine: 1.01 (ref 1.005–1.030)
Urobilinogen, UA: 0.2 mg/dL (ref 0.0–1.0)
pH: 5.5 (ref 5.0–8.0)

## 2013-04-14 LAB — TROPONIN I

## 2013-04-14 LAB — I-STAT CG4 LACTIC ACID, ED: LACTIC ACID, VENOUS: 1.82 mmol/L (ref 0.5–2.2)

## 2013-04-14 LAB — POC OCCULT BLOOD, ED: Fecal Occult Bld: POSITIVE — AB

## 2013-04-14 LAB — LIPASE, BLOOD: Lipase: 8 U/L — ABNORMAL LOW (ref 11–59)

## 2013-04-14 MED ORDER — SODIUM CHLORIDE 0.9 % IV SOLN: INTRAVENOUS | Status: DC

## 2013-04-14 MED ORDER — ONDANSETRON HCL 4 MG PO TABS: 4.0000 mg | ORAL_TABLET | Freq: Four times a day (QID) | ORAL | Status: DC

## 2013-04-14 MED ORDER — SODIUM CHLORIDE 0.9 % IV BOLUS (SEPSIS)
1000.0000 mL | Freq: Once | INTRAVENOUS | Status: AC
Start: 2013-04-14 — End: 2013-04-14
  Administered 2013-04-14: 1000 mL via INTRAVENOUS

## 2013-04-14 MED ORDER — IOHEXOL 300 MG/ML  SOLN
25.0000 mL | Freq: Once | INTRAMUSCULAR | Status: AC | PRN
  Administered 2013-04-14: 25 mL via ORAL

## 2013-04-14 MED ORDER — ONDANSETRON HCL 4 MG/2ML IJ SOLN
4.0000 mg | Freq: Once | INTRAMUSCULAR | Status: AC
  Administered 2013-04-14: 4 mg via INTRAVENOUS
  Filled 2013-04-14: qty 2

## 2013-04-14 MED ORDER — IOHEXOL 300 MG/ML  SOLN
80.0000 mL | Freq: Once | INTRAMUSCULAR | Status: AC | PRN
  Administered 2013-04-14: 80 mL via INTRAVENOUS

## 2013-04-14 MED ORDER — SODIUM CHLORIDE 0.9 % IV BOLUS (SEPSIS)
1000.0000 mL | Freq: Once | INTRAVENOUS | Status: AC
  Administered 2013-04-14: 1000 mL via INTRAVENOUS

## 2013-04-14 NOTE — Telephone Encounter (Signed)
Pt called office advise me she is SOB, pt could hardly catch her breath to speak. Pt let me know family is on their way to get her, and take her to hospital. I advise pt that she sounded really SOB and should call 911. Pt verbalized understanding

## 2013-04-14 NOTE — ED Notes (Signed)
Patient transported to CT 

## 2013-04-14 NOTE — ED Provider Notes (Signed)
CSN: [id number]     Arrival date & time 04/14/13  1318 History   First MD Initiated Contact with Patient 04/14/13 1456     Chief Complaint  Patient presents with  . Nausea  . Emesis  . Diarrhea     (Consider location/radiation/quality/duration/timing/severity/associated sxs/prior Treatment) HPI Comments: Patient presents with nausea, vomiting, diarrhea that have been ongoing since last night around 1900. She had >10 episodes of nonbloody nonbilious emesis since last night, similar amount of nonbloody diarrhea that is described as watery.  She endorses some diffuse abdominal soreness. No chest pain or shortness of breath. No dysuria, hematuria. No fever. She denies any sick contacts. No recent antibiotic use. No recent travel. She noticed some blood when she wiped but no blood in the toilet bowel or stool.  The history is provided by the patient.    Past Medical History  Diagnosis Date  . Hypertension   . Hyperlipidemia   . Osteopenia   . Diverticulosis   . DDD (degenerative disc disease)   . Chronic pain   . Stroke   . Syncope    Past Surgical History  Procedure Laterality Date  . Abdominal hysterectomy    . Tonsillectomy    . Exploratory laparotomy      2 times both neg   . Spine surgery      l3-l4 microdiscectomy   . Laminectomy  06-2003    l2-l4  . Doppler echocardiography      normal  . Carotid doppler      normal   Family History  Problem Relation Age of Onset  . Hypertension Father   . COPD Father    History  Substance Use Topics  . Smoking status: Never Smoker   . Smokeless tobacco: Never Used  . Alcohol Use: No   OB History   Grav Para Term Preterm Abortions TAB SAB Ect Mult Living                 Review of Systems  Constitutional: Positive for activity change, appetite change and fatigue. Negative for fever.  HENT: Negative for congestion and rhinorrhea.   Respiratory: Negative for cough, chest tightness and shortness of breath.   Cardiovascular:  Negative for chest pain.  Gastrointestinal: Positive for nausea, vomiting, abdominal pain and diarrhea.  Genitourinary: Negative for dysuria and hematuria.  Musculoskeletal: Negative for arthralgias, back pain and myalgias.  Skin: Negative for rash.  Neurological: Positive for weakness. Negative for dizziness.  A complete 10 system review of systems was obtained and all systems are negative except as noted in the HPI and PMH.      Allergies  Celecoxib; Codeine; and Etodolac  Home Medications   Current Outpatient Rx  Name  Route  Sig  Dispense  Refill  . Ascorbic Acid (VITAMIN C PO)   Oral   Take 1 tablet by mouth.         . calcium-vitamin D (OSCAL WITH D) 250-125 MG-UNIT per tablet   Oral   Take 1 tablet by mouth daily.         . Cholecalciferol (VITAMIN D PO)   Oral   Take 1 tablet by mouth daily.         . clopidogrel (PLAVIX) 75 MG tablet   Oral   Take 75 mg by mouth daily with breakfast.         . fish oil-omega-3 fatty acids 1000 MG capsule   Oral   Take 1 g by mouth daily.         Marland Kitchen  lisinopril (PRINIVIL,ZESTRIL) 10 MG tablet   Oral   Take 20 mg by mouth daily.         . traMADol (ULTRAM) 50 MG tablet   Oral   Take 50 mg by mouth daily.          . ondansetron (ZOFRAN) 4 MG tablet   Oral   Take 1 tablet (4 mg total) by mouth every 6 (six) hours.   12 tablet   0    BP 131/74  Pulse 83  Temp(Src) 100.1 F (37.8 C) (Oral)  Resp 16  Ht 5\' 6"  (1.676 m)  Wt 160 lb (72.576 kg)  BMI 25.84 kg/m2  SpO2 97% Physical Exam  Constitutional: She is oriented to person, place, and time. She appears well-developed and well-nourished. No distress.  HENT:  Head: Normocephalic and atraumatic.  Mouth/Throat: Oropharynx is clear and moist. No oropharyngeal exudate.  Moist mucus membranes  Eyes: Conjunctivae and EOM are normal. Pupils are equal, round, and reactive to light.  Neck: Normal range of motion. Neck supple.  Cardiovascular: Normal rate,  regular rhythm and normal heart sounds.   No murmur heard. HR 105.  Pulmonary/Chest: Effort normal and breath sounds normal. No respiratory distress.  Abdominal: Soft. There is tenderness. There is no rebound and no guarding.  Mild diffuse tenderness  Genitourinary:  No external hemoroids, no gross blood  Musculoskeletal: Normal range of motion. She exhibits no edema and no tenderness.  Neurological: She is alert and oriented to person, place, and time. No cranial nerve deficit. She exhibits normal muscle tone. Coordination normal.  Skin: Skin is warm. She is not diaphoretic.    ED Course  Procedures (including critical care time) Labs Review Labs Reviewed  CBC WITH DIFFERENTIAL - Abnormal; Notable for the following:    Neutrophils Relative % 92 (*)    Neutro Abs 9.4 (*)    Lymphocytes Relative 3 (*)    Lymphs Abs 0.3 (*)    All other components within normal limits  COMPREHENSIVE METABOLIC PANEL - Abnormal; Notable for the following:    Glucose, Bld 139 (*)    GFR calc non Af Amer 51 (*)    GFR calc Af Amer 60 (*)    All other components within normal limits  LIPASE, BLOOD - Abnormal; Notable for the following:    Lipase 8 (*)    All other components within normal limits  URINALYSIS, ROUTINE W REFLEX MICROSCOPIC - Abnormal; Notable for the following:    APPearance HAZY (*)    Hgb urine dipstick LARGE (*)    All other components within normal limits  URINE MICROSCOPIC-ADD ON - Abnormal; Notable for the following:    Casts HYALINE CASTS (*)    Crystals CA OXALATE CRYSTALS (*)    All other components within normal limits  POC OCCULT BLOOD, ED - Abnormal; Notable for the following:    Fecal Occult Bld POSITIVE (*)    All other components within normal limits  TROPONIN I  I-STAT CG4 LACTIC ACID, ED   Imaging Review Ct Abdomen Pelvis W Contrast  04/14/2013   CLINICAL DATA:  Nausea vomiting and diarrhea for preceding day  EXAM: CT ABDOMEN AND PELVIS WITH CONTRAST  TECHNIQUE:  Multidetector CT imaging of the abdomen and pelvis was performed using the standard protocol following bolus administration of intravenous contrast. The patient also received oral contrast material.  CONTRAST:  80mL OMNIPAQUE IOHEXOL 300 MG/ML  SOLN  COMPARISON:  DG LUMBAR SPINE COMPLETE dated 03/16/2012;  FINDINGS:  The liver exhibits no focal mass. There is a trace of intrahepatic ductal dilation. The gallbladder is adequately distended with no evidence of stones or surrounding inflammatory change. The pancreas exhibits fatty infiltrative change with no focal mass or ductal dilation. The spleen and adrenal glands exhibit no acute abnormalities. The caliber of the abdominal aorta is normal. There is no periaortic or pericaval lymphadenopathy.  The kidneys are normal in contour. There is no hydronephrosis. There are nonobstructing stones measuring up to 3 mm in diameter on the right. There are hypodensities in the right and left kidney most compatible with cysts. Within the left kidney exophytic from the lower pole there is a 2.5 cm maximal diameter hypodensity with Hounsfield measurement of +15 immediately post contrast which rises to +18 on delayed images. The caliber of the abdominal aorta is normal. On the right in the upper pole there is a lobulated approximately 2 cm diameter hypodensity with HU measurement of +10 immediately post contrast which falls to +3 on delayed images. In the medial aspect of the midpole of the right kidney there is a 1 cm diameter hypodensity containing a mural calcification. It exhibits HU measurement of +11 immediately post contrast and rises to +19 on delayed images.  The stomach is moderately distended with the oral contrast material. There is a small amount of contrast within normal-appearing small bowel loops. There is no evidence of a small bowel obstruction or enteritis. The terminal ileum is normal in appearance. The appendix is not discretely demonstrated. There is no pericecal  inflammatory change. The colon demonstrates no wall thickening or abnormal distention. No significant diverticulosis is demonstrated.  The uterus is surgically absent. There are no adnexal masses. There is no free pelvic fluid. The partially distended urinary bladder is normal in appearance. The psoas musculature exhibits no evidence of abscess or other mass. There is no inguinal nor significant umbilical hernia.  The lumbar vertebral bodies are preserved in height but there is dextroscoliosis with moderate severe degenerative change of the mid lumbar spine. The bony pelvis exhibits no acute abnormalities. The lung bases exhibit mild compressive atelectasis posteriorly.  IMPRESSION: 1. There is no evidence of a small or large bowel obstruction. There are no findings to suggest enteritis or colitis or diverticulitis. The appendix is not discretely demonstrated. 2. There is no evidence of obstruction of either kidney nor evidence of pyelonephritis. There are findings consistent with nonobstructing stones on the right, and there are bilateral cystic appearing structures in the kidneys. 3. There is no evidence of acute hepatobiliary abnormality. 4. There is mildl atelectasis at the lung bases.   Electronically Signed   By: David  [place]   On: 04/14/2013 17:23     EKG Interpretation   Date/Time:  Wednesday April 14 2013 18:00:46 EST Ventricular Rate:  90 PR Interval:  200 QRS Duration: 78 QT Interval:  428 QTC Calculation: 524 R Axis:   93 Text Interpretation:  Sinus rhythm Right axis deviation Prolonged QT  interval Nonspecific ST and T wave abnormality Confirmed by Manus Gunning  MD,  Kayra Crowell [phone]) on 04/14/2013 6:30:16 PM      MDM   Final diagnoses:  Gastroenteritis   Nausea, vomiting, and diarrhea since last night. Associated abdominal soreness. No fever. No sick contacts, travel or recent antibiotic use.  Patient given IV fluids antiemetics. Abdomen is soft without peritoneal signs. Mild  tachycardia.  Labs are unremarkable. No chest pain or shortness of breath. Lactate is normal.  CT scan does not show any  acute pathology. Patient's heart rate is improved to 89 after IV fluids. She is tolerating by mouth and ambulatory. Return precautions discussed including worsening pain, vomiting, dizziness, chest pain, or any other concerns.  BP 131/74  Pulse 83  Temp(Src) 100.1 F (37.8 C) (Oral)  Resp 16  Ht 5\' 6"  (1.676 m)  Wt 160 lb (72.576 kg)  BMI 25.84 kg/m2  SpO2 97%    Glynn OctaveStephen Lael Pilch, MD 04/15/13 [phone]

## 2013-04-14 NOTE — Telephone Encounter (Signed)
Patient Information:  Caller Name: Gigi Gineggy  Phone: [phone]  Patient: [name], [name]  Gender: Female  DOB: 03-Jul-1930  Age: 80 Years  PCP: Roxy Mannsower, [facility](Family Practice)  Office Follow Up:  Does the office need to follow up with this patient?: No  Instructions For The Office: N/A  RN Note:  Triage RN verified with clinical office staff ok to send to ER. Pt states she will call her brother who lives down the street to come get [facility] now [facility]. She will continue sipping fluids and EMS parameters also reviewed. Agreed to plan.  Symptoms  Reason For Call & Symptoms: Vomiting/diarrhea onset yesterday evening (04/13/13). Vomited at least 12x overnight with diarrhea too many to count, per pt and is now just water. At times she has felt like she may have had a fever but is unsure as she did not check it. Feels very weak. States overnight she thought she may pass out. Denies that feeling currently. Is sipping gingerale and Triage RN encouraged continued sipping throughout call.  Reviewed Health History In EMR: Yes  Reviewed Medications In EMR: Yes  Reviewed Allergies In EMR: Yes  Reviewed Surgeries / Procedures: Yes  Date of Onset of Symptoms: 04/13/2013  Guideline(s) Used:  Vomiting  Disposition Per Guideline:   Go to ED Now  Reason For Disposition Reached:   Severe vomiting (e.g., 6 or more times/day)  Advice Given:  Clear Liquids:  Sip water or a rehydration drink (e.g., Gatorade or Powerade).  Other options: 1/2 strength flat lemon-lime soda or ginger ale.  After 4 hours without vomiting, increase the amount.  Patient Will Follow Care Advice:  YES

## 2013-04-14 NOTE — ED Notes (Signed)
Pt tolerating ginger ale well 

## 2013-04-14 NOTE — ED Notes (Signed)
Pt presents with c/o N/V/D starting last night.  Pt states her PCP told her to come to ED for IV fluids.

## 2013-04-14 NOTE — Discharge Instructions (Signed)
Viral Gastroenteritis Take the medication as prescribed. Followup with your Dr. this week. Return to ED if you develop new or worsening symptoms. Viral gastroenteritis is also known as stomach flu. This condition affects the stomach and intestinal tract. It can cause sudden diarrhea and vomiting. The illness typically lasts 3 to 8 days. Most people develop an immune response that eventually gets rid of the virus. While this natural response develops, the virus can make you quite ill. CAUSES  Many different viruses can cause gastroenteritis, such as rotavirus or noroviruses. You can catch one of these viruses by consuming contaminated food or water. You may also catch a virus by sharing utensils or other personal items with an infected person or by touching a contaminated surface. SYMPTOMS  The most common symptoms are diarrhea and vomiting. These problems can cause a severe loss of body fluids (dehydration) and a body salt (electrolyte) imbalance. Other symptoms may include:  Fever.  Headache.  Fatigue.  Abdominal pain. DIAGNOSIS  Your caregiver can usually diagnose viral gastroenteritis based on your symptoms and a physical exam. A stool sample may also be taken to test for the presence of viruses or other infections. TREATMENT  This illness typically goes away on its own. Treatments are aimed at rehydration. The most serious cases of viral gastroenteritis involve vomiting so severely that you are not able to keep fluids down. In these cases, fluids must be given through an intravenous line (IV). HOME CARE INSTRUCTIONS   Drink enough fluids to keep your urine clear or pale yellow. Drink small amounts of fluids frequently and increase the amounts as tolerated.  Ask your caregiver for specific rehydration instructions.  Avoid:  Foods high in sugar.  Alcohol.  Carbonated drinks.  Tobacco.  Juice.  Caffeine drinks.  Extremely hot or cold fluids.  Fatty, greasy foods.  Too much  intake of anything at one time.  Dairy products until 24 to 48 hours after diarrhea stops.  You may consume probiotics. Probiotics are active cultures of beneficial bacteria. They may lessen the amount and number of diarrheal stools in adults. Probiotics can be found in yogurt with active cultures and in supplements.  Wash your hands well to avoid spreading the virus.  Only take over-the-counter or prescription medicines for pain, discomfort, or fever as directed by your caregiver. Do not give aspirin to children. Antidiarrheal medicines are not recommended.  Ask your caregiver if you should continue to take your regular prescribed and over-the-counter medicines.  Keep all follow-up appointments as directed by your caregiver. SEEK IMMEDIATE MEDICAL CARE IF:   You are unable to keep fluids down.  You do not urinate at least once every 6 to 8 hours.  You develop shortness of breath.  You notice blood in your stool or vomit. This may look like coffee grounds.  You have abdominal pain that increases or is concentrated in one small area (localized).  You have persistent vomiting or diarrhea.  You have a fever.  The patient is a child younger than 3 months, and he or she has a fever.  The patient is a child older than 3 months, and he or she has a fever and persistent symptoms.  The patient is a child older than 3 months, and he or she has a fever and symptoms suddenly get worse.  The patient is a baby, and he or she has no tears when crying. MAKE SURE YOU:   Understand these instructions.  Will watch your condition.  Will get  help right away if you are not doing well or get worse. Document Released: 01/28/2005 Document Revised: 04/22/2011 Document Reviewed: 11/14/2010 [facility] Patient Information 2014 [employer] [place].

## 2013-04-14 NOTE — ED Notes (Signed)
NOTIFIED DR. RANCOUR IN PRESON OF PATIENTS LAB RESULTS OF CG4+ LACTIC ACID ,04/14/2013.

## 2013-04-14 NOTE — Telephone Encounter (Signed)
Agree with calling 911 Please get ER records when avail -thanks

## 2013-04-14 NOTE — Telephone Encounter (Signed)
[facility] [place]

## 2013-04-14 NOTE — ED Notes (Signed)
Pt provided ginger ale.  

## 2013-04-14 NOTE — ED Notes (Signed)
CT made aware pt is finished with her contrast.

## 2013-04-15 ENCOUNTER — Telehealth: Payer: Self-pay

## 2013-04-15 ENCOUNTER — Other Ambulatory Visit: Payer: Self-pay | Admitting: Family Medicine

## 2013-04-15 NOTE — Telephone Encounter (Signed)
Electronic refill request, please advise  

## 2013-04-15 NOTE — Telephone Encounter (Signed)
Pt left v/m requesting call back of results of test done at ED on 04/14/13.

## 2013-04-15 NOTE — Telephone Encounter (Signed)
plz notify - ct scan looked ok, blood work overall ok. ER thought she had a viral gastroenteritis or stomach bug - how is she feeling today?

## 2013-04-15 NOTE — Telephone Encounter (Signed)
Patient notified. She said she is actually feeling some better today. She said the diarrhea has almost stopped and she was able to eat a plain baked potato. I advised to continue with a bland diet through the weekend with plenty of fluids. She verbalized understanding.

## 2013-04-15 NOTE — Telephone Encounter (Signed)
Did I just do this yesterday or am I wrong ?

## 2013-04-15 NOTE — Telephone Encounter (Signed)
Rx declined was called in yesterday

## 2013-04-19 ENCOUNTER — Ambulatory Visit (INDEPENDENT_AMBULATORY_CARE_PROVIDER_SITE_OTHER): Payer: Medicare Other | Admitting: Family Medicine

## 2013-04-19 ENCOUNTER — Encounter: Payer: Self-pay | Admitting: Family Medicine

## 2013-04-19 VITALS — BP 146/90 | HR 72 | Temp 98.2°F | Ht 65.0 in | Wt 151.0 lb

## 2013-04-19 DIAGNOSIS — A084 Viral intestinal infection, unspecified: Secondary | ICD-10-CM | POA: Insufficient documentation

## 2013-04-19 DIAGNOSIS — A088 Other specified intestinal infections: Secondary | ICD-10-CM

## 2013-04-19 DIAGNOSIS — Z1211 Encounter for screening for malignant neoplasm of colon: Secondary | ICD-10-CM

## 2013-04-19 MED ORDER — RANITIDINE HCL 150 MG PO TABS: 150.0000 mg | ORAL_TABLET | Freq: Two times a day (BID) | ORAL | Status: DC

## 2013-04-19 NOTE — Progress Notes (Signed)
Pre visit review using our clinic review tool, if applicable. No additional management support is needed unless otherwise documented below in the visit note. 

## 2013-04-19 NOTE — Progress Notes (Signed)
Subjective:    Patient ID: [name], female    DOB: 03-Aug-1930, 80 y.o.   MRN: [id number]  HPI Here for f/u of ER visit 04/15/13  Seen with n/v/d    (overall the really bad part was 2 days) Does not think she had a fever with it -but had chills one day  Given anti emetics - zofran (she has taken 3 of them since coming home)  ? Where she got it- had been to a nursing home   Still feels quite "washed out"   Is improved  Some nausea in ams  Stomach hurts a bit when she eats -mildly  Eats soup/pudding/ and drinks ginger ale  She is hungry    Admission on 04/14/2013, Discharged on 04/14/2013  Component Date Value Ref Range Status  . WBC 04/14/2013 10.2  4.0 - 10.5 K/uL Final  . RBC 04/14/2013 4.28  3.87 - 5.11 MIL/uL Final  . Hemoglobin 04/14/2013 14.1  12.0 - 15.0 g/dL Final  . HCT 04/14/2013 40.9  36.0 - 46.0 % Final  . MCV 04/14/2013 95.6  78.0 - 100.0 fL Final  . MCH 04/14/2013 32.9  26.0 - 34.0 pg Final  . MCHC 04/14/2013 34.5  30.0 - 36.0 g/dL Final  . RDW 04/14/2013 13.0  11.5 - 15.5 % Final  . Platelets 04/14/2013 215  150 - 400 K/uL Final  . Neutrophils Relative % 04/14/2013 92* 43 - 77 % Final  . Neutro Abs 04/14/2013 9.4* 1.7 - 7.7 K/uL Final  . Lymphocytes Relative 04/14/2013 3* 12 - 46 % Final  . Lymphs Abs 04/14/2013 0.3* 0.7 - 4.0 K/uL Final  . Monocytes Relative 04/14/2013 5  3 - 12 % Final  . Monocytes Absolute 04/14/2013 0.5  0.1 - 1.0 K/uL Final  . Eosinophils Relative 04/14/2013 0  0 - 5 % Final  . Eosinophils Absolute 04/14/2013 0.0  0.0 - 0.7 K/uL Final  . Basophils Relative 04/14/2013 0  0 - 1 % Final  . Basophils Absolute 04/14/2013 0.0  0.0 - 0.1 K/uL Final  . Sodium 04/14/2013 142  137 - 147 mEq/L Final  . Potassium 04/14/2013 4.0  3.7 - 5.3 mEq/L Final  . Chloride 04/14/2013 103  96 - 112 mEq/L Final  . CO2 04/14/2013 23  19 - 32 mEq/L Final  . Glucose, Bld 04/14/2013 139* 70 - 99 mg/dL Final  . BUN 04/14/2013 20  6 - 23 mg/dL Final  .  Creatinine, Ser 04/14/2013 1.00  0.50 - 1.10 mg/dL Final  . Calcium 04/14/2013 10.4  8.4 - 10.5 mg/dL Final  . Total Protein 04/14/2013 7.3  6.0 - 8.3 g/dL Final  . Albumin 04/14/2013 3.6  3.5 - 5.2 g/dL Final  . AST 04/14/2013 20  0 - 37 U/L Final  . ALT 04/14/2013 14  0 - 35 U/L Final  . Alkaline Phosphatase 04/14/2013 81  39 - 117 U/L Final  . Total Bilirubin 04/14/2013 0.5  0.3 - 1.2 mg/dL Final  . GFR calc non Af Amer 04/14/2013 51* >90 mL/min Final  . GFR calc Af Amer 04/14/2013 60* >90 mL/min Final   Comment: (NOTE)                          The eGFR has been calculated using the CKD EPI equation.  This calculation has not been validated in all clinical situations.                          eGFR's persistently <90 mL/min signify possible Chronic Kidney                          Disease.  . Lipase 04/14/2013 8* 11 - 59 U/L Final  . Color, Urine 04/14/2013 YELLOW  YELLOW Final  . APPearance 04/14/2013 HAZY* CLEAR Final  . Specific Gravity, Urine 04/14/2013 1.010  1.005 - 1.030 Final  . pH 04/14/2013 5.5  5.0 - 8.0 Final  . Glucose, UA 04/14/2013 NEGATIVE  NEGATIVE mg/dL Final  . Hgb urine dipstick 04/14/2013 LARGE* NEGATIVE Final  . Bilirubin Urine 04/14/2013 NEGATIVE  NEGATIVE Final  . Ketones, ur 04/14/2013 NEGATIVE  NEGATIVE mg/dL Final  . Protein, ur 04/14/2013 NEGATIVE  NEGATIVE mg/dL Final  . Urobilinogen, UA 04/14/2013 0.2  0.0 - 1.0 mg/dL Final  . Nitrite 04/14/2013 NEGATIVE  NEGATIVE Final  . Leukocytes, UA 04/14/2013 NEGATIVE  NEGATIVE Final  . Lactic Acid, Venous 04/14/2013 1.82  0.5 - 2.2 mmol/L Final  . Fecal Occult Bld 04/14/2013 POSITIVE* NEGATIVE Final  . Troponin I 04/14/2013 <0.30  <0.30 ng/mL Final   Comment:                                 Due to the release kinetics of cTnI,                          a negative result within the first hours                          of the onset of symptoms does not rule out                           myocardial infarction with certainty.                          If myocardial infarction is still suspected,                          repeat the test at appropriate intervals.  . Squamous Epithelial / LPF 04/14/2013 RARE  RARE Final  . RBC / HPF 04/14/2013 7-10  <3 RBC/hpf Final  . Bacteria, UA 04/14/2013 RARE  RARE Final  . Casts 04/14/2013 HYALINE CASTS* NEGATIVE Final  . Crystals 04/14/2013 CA OXALATE CRYSTALS* NEGATIVE Final    Fecal occult blood neg = pt states she had hemorrhoids from diarrhea  Patient Active Problem List   Diagnosis Date Noted  . At risk for falling 12/02/2011  . Hyperparathyroidism 11/20/2011  . Back pain, thoracic 09/30/2011  . Scoliosis 09/30/2011  . Degenerative disc disease, lumbar 09/30/2011  . Renal insufficiency 05/27/2011  . Syncope 12/05/2010  . CVA 02/26/2008  . HYPERLIPIDEMIA 05/12/2006  . HYPERTENSION 05/12/2006  . DIVERTICULOSIS, COLON 05/12/2006  . OSTEOPENIA 05/12/2006   Past Medical History  Diagnosis Date  . Hypertension   . Hyperlipidemia   . Osteopenia   . Diverticulosis   . DDD (degenerative disc disease)   . Chronic pain   . Stroke   . Syncope  Past Surgical History  Procedure Laterality Date  . Abdominal hysterectomy    . Tonsillectomy    . Exploratory laparotomy      2 times both neg   . Spine surgery      l3-l4 microdiscectomy   . Laminectomy  06-2003    l2-l4  . Doppler echocardiography      normal  . Carotid doppler      normal   History  Substance Use Topics  . Smoking status: Never Smoker   . Smokeless tobacco: Never Used  . Alcohol Use: No   Family History  Problem Relation Age of Onset  . Hypertension Father   . COPD Father    Allergies  Allergen Reactions  . Celecoxib Other (See Comments)    REACTION: affects kidneys  . Codeine Nausea Only    And dizziness  . Etodolac Rash   Current Outpatient Prescriptions on File Prior to Visit  Medication Sig Dispense Refill  . Ascorbic Acid (VITAMIN C  PO) Take 1 tablet by mouth.      . calcium-vitamin D (OSCAL WITH D) 250-125 MG-UNIT per tablet Take 1 tablet by mouth daily.      . Cholecalciferol (VITAMIN D PO) Take 1 tablet by mouth daily.      . clopidogrel (PLAVIX) 75 MG tablet Take 75 mg by mouth daily with breakfast.      . fish oil-omega-3 fatty acids 1000 MG capsule Take 1 g by mouth daily.      Marland Kitchen lisinopril (PRINIVIL,ZESTRIL) 10 MG tablet Take 20 mg by mouth daily.      . ondansetron (ZOFRAN) 4 MG tablet Take 1 tablet (4 mg total) by mouth every 6 (six) hours.  12 tablet  0  . traMADol (ULTRAM) 50 MG tablet Take 50 mg by mouth daily.        No current facility-administered medications on file prior to visit.     CT abd/pelvis-nothing significant - rev with pt    Review of Systems Review of Systems  Constitutional: Negative for fever, appetite change, and unexpected weight change. pos for fatigue Eyes: Negative for pain and visual disturbance.  Respiratory: Negative for cough and shortness of breath.   Cardiovascular: Negative for cp or palpitations    Gastrointestinal: Negative for vomiting/diarrhea/ constipation or blood in stool or dark stool  Genitourinary: Negative for urgency and frequency.  Skin: Negative for pallor or rash   Neurological: Negative for weakness, light-headedness, numbness and headaches.  Hematological: Negative for adenopathy. Does not bruise/bleed easily.  Psychiatric/Behavioral: Negative for dysphoric mood. The patient is not nervous/anxious.         Objective:   Physical Exam  Constitutional: She appears well-developed and well-nourished. No distress.  HENT:  Head: Normocephalic and atraumatic.  Mouth/Throat: Oropharynx is clear and moist.  Eyes: Conjunctivae and EOM are normal. Pupils are equal, round, and reactive to light. Right eye exhibits no discharge. Left eye exhibits no discharge. No scleral icterus.  Neck: Normal range of motion. Neck supple.  Cardiovascular: Normal rate and regular  rhythm.   Pulmonary/Chest: Effort normal and breath sounds normal. No respiratory distress. She has no wheezes. She has no rales.  Abdominal: Soft. Bowel sounds are normal. She exhibits no distension and no mass. There is no tenderness. There is no rebound and no guarding.  Musculoskeletal: She exhibits no edema.  Lymphadenopathy:    She has no cervical adenopathy.  Neurological: She is alert.  Skin: Skin is warm and dry. No rash noted.  No pallor.  Nl color and turgor  Brisk capillary refill  Psychiatric: She has a normal mood and affect.          Assessment & Plan:

## 2013-04-19 NOTE — Assessment & Plan Note (Signed)
Improved but with residual fatigue and symptoms of gastritis Rev ER records in detail as well as labs/studies and CT scan  Had heme pos stool (pt thinks she may have had hemorroids)- will give ifob to repeat  Disc DASH diet with very gradual advancement Also zantac 150 bid for gastritis  Update if not starting to improve in a week or if worsening

## 2013-04-19 NOTE — Assessment & Plan Note (Signed)
ifob card given Pt had heme pos stool in ER that was likely illness related

## 2013-04-19 NOTE — Patient Instructions (Signed)
Drink fluids  Please do the stool card  Stir the bubbles out of your ginger ale so you do not swallow gas  BRAT diet - banana/ rice/ apple sauce and toast -then advance diet as tolerated Drink mostly water  Take the zantac as directed for stomach discomfort  Update if not starting to improve in a week or if worsening

## 2013-04-20 ENCOUNTER — Other Ambulatory Visit (INDEPENDENT_AMBULATORY_CARE_PROVIDER_SITE_OTHER): Payer: Medicare Other

## 2013-04-20 DIAGNOSIS — Z1211 Encounter for screening for malignant neoplasm of colon: Secondary | ICD-10-CM

## 2013-04-20 LAB — FECAL OCCULT BLOOD, IMMUNOCHEMICAL: Fecal Occult Bld: POSITIVE — AB

## 2013-05-31 ENCOUNTER — Telehealth: Payer: Self-pay

## 2013-05-31 NOTE — Telephone Encounter (Signed)
Pt wanted to know if Apr 22 appt at 12 noon appt was for blood test; advised pt f/u about blood in stool but pt can eat and drink normally.

## 2013-06-02 ENCOUNTER — Encounter: Payer: Self-pay | Admitting: Family Medicine

## 2013-06-02 ENCOUNTER — Ambulatory Visit (INDEPENDENT_AMBULATORY_CARE_PROVIDER_SITE_OTHER): Payer: Medicare Other | Admitting: Family Medicine

## 2013-06-02 VITALS — BP 142/76 | HR 90 | Temp 98.0°F | Ht 65.0 in | Wt 149.0 lb

## 2013-06-02 DIAGNOSIS — R195 Other fecal abnormalities: Secondary | ICD-10-CM

## 2013-06-02 DIAGNOSIS — Z1211 Encounter for screening for malignant neoplasm of colon: Secondary | ICD-10-CM

## 2013-06-02 NOTE — Progress Notes (Signed)
Pre visit review using our clinic review tool, if applicable. No additional management support is needed unless otherwise documented below in the visit note. 

## 2013-06-02 NOTE — Patient Instructions (Addendum)
You did not have blood in your stool today -that is reassuring  Please do the IFOB card again - if this is positive we will refer you for a colonoscopy  Let me know if any problems

## 2013-06-02 NOTE — Progress Notes (Signed)
Subjective:    Patient ID: [name], female    DOB: Dec 10, 1930, [age] y.o.   MRN: [id number]  HPI Feels good and back to normal   Her last colonoscopy back in 2000  Per pt it was ok  Diverticulosis   She would rather not have colonosc unless necessary   Has held on her zantac - no longer needs it   No constipation or diarrhea or blood in stool or dark stool  Patient Active Problem List   Diagnosis Date Noted  . Heme + stool 06/02/2013  . Viral gastroenteritis 04/19/2013  . Colon cancer screening 04/19/2013  . At risk for falling 12/02/2011  . Hyperparathyroidism 11/20/2011  . Back pain, thoracic 09/30/2011  . Scoliosis 09/30/2011  . Degenerative disc disease, lumbar 09/30/2011  . Renal insufficiency 05/27/2011  . Syncope 12/05/2010  . CVA 02/26/2008  . HYPERLIPIDEMIA 05/12/2006  . HYPERTENSION 05/12/2006  . DIVERTICULOSIS, COLON 05/12/2006  . OSTEOPENIA 05/12/2006   Past Medical History  Diagnosis Date  . Hypertension   . Hyperlipidemia   . Osteopenia   . Diverticulosis   . DDD (degenerative disc disease)   . Chronic pain   . Stroke   . Syncope    Past Surgical History  Procedure Laterality Date  . Abdominal hysterectomy    . Tonsillectomy    . Exploratory laparotomy      2 times both neg   . Spine surgery      l3-l4 microdiscectomy   . Laminectomy  06-2003    l2-l4  . Doppler echocardiography      normal  . Carotid doppler      normal   History  Substance Use Topics  . Smoking status: Never Smoker   . Smokeless tobacco: Never Used  . Alcohol Use: No   Family History  Problem Relation Age of Onset  . Hypertension Father   . COPD Father    Allergies  Allergen Reactions  . Celecoxib Other (See Comments)    REACTION: affects kidneys  . Codeine Nausea Only    And dizziness  . Etodolac Rash   Current Outpatient Prescriptions on File Prior to Visit  Medication Sig Dispense Refill  . Ascorbic Acid (VITAMIN C PO) Take 1 tablet by mouth.       . calcium-vitamin D (OSCAL WITH D) 250-125 MG-UNIT per tablet Take 1 tablet by mouth daily.      . Cholecalciferol (VITAMIN D PO) Take 1 tablet by mouth daily.      . clopidogrel (PLAVIX) 75 MG tablet Take 75 mg by mouth daily with breakfast.      . fish oil-omega-3 fatty acids 1000 MG capsule Take 1 g by mouth daily.      Marland Kitchen. lisinopril (PRINIVIL,ZESTRIL) 10 MG tablet Take 20 mg by mouth daily.      . traMADol (ULTRAM) 50 MG tablet Take 50 mg by mouth daily.        No current facility-administered medications on file prior to visit.    Review of Systems Review of Systems  Constitutional: Negative for fever, appetite change, fatigue and unexpected weight change.  Eyes: Negative for pain and visual disturbance.  Respiratory: Negative for cough and shortness of breath.   Cardiovascular: Negative for cp or palpitations    Gastrointestinal: Negative for nausea, diarrhea and constipation.  Genitourinary: Negative for urgency and frequency.  Skin: Negative for pallor or rash   Neurological: Negative for weakness, light-headedness, numbness and headaches.  Hematological: Negative for adenopathy.  Does not bruise/bleed easily.  Psychiatric/Behavioral: Negative for dysphoric mood. The patient is not nervous/anxious.         Objective:   Physical Exam  Constitutional: She appears well-developed and well-nourished. No distress.  HENT:  Head: Normocephalic and atraumatic.  Mouth/Throat: Oropharynx is clear and moist.  Eyes: Conjunctivae and EOM are normal. Pupils are equal, round, and reactive to light. No scleral icterus.  Neck: Normal range of motion. Neck supple.  Cardiovascular: Normal rate and regular rhythm.   Pulmonary/Chest: Effort normal and breath sounds normal.  Abdominal: Soft. Bowel sounds are normal. She exhibits no distension and no mass. There is no tenderness. There is no rebound and no guarding.  Genitourinary: Rectum normal. Rectal exam shows no fissure, no tenderness and  anal tone normal. Guaiac negative stool.  Skin: Skin is warm and dry. No pallor.  Psychiatric: She has a normal mood and affect.          Assessment & Plan:

## 2013-06-03 NOTE — Assessment & Plan Note (Addendum)
Will repeat IFOB now that there is heme neg stool Pt was at end of a gastroenteritis last time

## 2013-06-03 NOTE — Assessment & Plan Note (Signed)
Suspect in past this was due to infx  Heme card in office neg today  Will repeat IFOB now

## 2013-06-09 ENCOUNTER — Other Ambulatory Visit (INDEPENDENT_AMBULATORY_CARE_PROVIDER_SITE_OTHER): Payer: Medicare Other

## 2013-06-09 DIAGNOSIS — Z1211 Encounter for screening for malignant neoplasm of colon: Secondary | ICD-10-CM

## 2013-06-09 LAB — FECAL OCCULT BLOOD, IMMUNOCHEMICAL: Fecal Occult Bld: NEGATIVE

## 2013-06-09 LAB — FECAL OCCULT BLOOD, GUAIAC: Fecal Occult Blood: NEGATIVE

## 2013-06-10 ENCOUNTER — Encounter: Payer: Self-pay | Admitting: *Deleted

## 2013-06-10 ENCOUNTER — Encounter: Payer: Self-pay | Admitting: Family Medicine

## 2013-07-19 ENCOUNTER — Telehealth: Payer: Self-pay | Admitting: Family Medicine

## 2013-07-19 NOTE — Telephone Encounter (Signed)
Patient Information:  Caller Name: Anisa  Phone: [phone]  Patient: [name], [name]  Gender: Female  DOB: 09/14/1930  Age: 80 Years  PCP: Roxy Manns [facility], [place])  Office Follow Up:  Does the office need to follow up with this patient?: Yes  Instructions For The Office: Please reveiw - appointment time not available for patient, Please contact patient at [phone] for appointment.   Symptoms  Reason For Call & Symptoms: Noticed tingle in left finger last week.  Tingle and numbness gradually moved up  left arm to just past the elbow.  Not pain so much as numbnees, tingling, if holding phone symptoms become worse.  Symptoms absent in the mornings but gradually worse during the day.  Expresses concern about twisted spine  Reviewed Health History In EMR: Yes  Reviewed Medications In EMR: Yes  Reviewed Allergies In EMR: Yes  Reviewed Surgeries / Procedures: Yes  Date of Onset of Symptoms: 07/14/2013  Guideline(s) Used:  Neurologic Deficit  Disposition Per Guideline:   Go to Office Now  Reason For Disposition Reached:   Tingling (e.g., pins and needles) of the face, arm or leg on one side of the body, that is present now  Advice Given:  Call Back If:  You become worse.  Patient Will Follow Care Advice:  YES

## 2013-07-19 NOTE — Telephone Encounter (Signed)
appt scheduled for tomorrow at 11:45am, I did advise pt that if sxs worsen or she develops any new sxs like slurred speech or weakness to go to ER, pt verbalized understanding

## 2013-07-19 NOTE — Telephone Encounter (Signed)
Please call and schedule an appt for her -thanks

## 2013-07-20 ENCOUNTER — Encounter: Payer: Self-pay | Admitting: Family Medicine

## 2013-07-20 ENCOUNTER — Ambulatory Visit (INDEPENDENT_AMBULATORY_CARE_PROVIDER_SITE_OTHER): Payer: Medicare Other | Admitting: Family Medicine

## 2013-07-20 VITALS — BP 150/96 | HR 87 | Temp 97.9°F | Ht 65.0 in | Wt 150.2 lb

## 2013-07-20 DIAGNOSIS — I10 Essential (primary) hypertension: Secondary | ICD-10-CM

## 2013-07-20 DIAGNOSIS — R05 Cough: Secondary | ICD-10-CM | POA: Insufficient documentation

## 2013-07-20 DIAGNOSIS — R209 Unspecified disturbances of skin sensation: Secondary | ICD-10-CM

## 2013-07-20 DIAGNOSIS — R200 Anesthesia of skin: Secondary | ICD-10-CM

## 2013-07-20 DIAGNOSIS — R059 Cough, unspecified: Secondary | ICD-10-CM | POA: Insufficient documentation

## 2013-07-20 MED ORDER — LOSARTAN POTASSIUM 50 MG PO TABS: 50.0000 mg | ORAL_TABLET | Freq: Every day | ORAL | Status: DC

## 2013-07-20 NOTE — Patient Instructions (Signed)
Stop your lisinopril-I think it may cause your cough  Start losartan instead - this should also work better for your blood pressure  Let me know if any problems with that  Follow up with me in 4-6 weeks to re check blood pressure Stop at check out for a referral to neurology for hand numbness  If symptoms worsen let us know  If any stroke symptoms - call 911

## 2013-07-20 NOTE — Progress Notes (Signed)
Pre visit review using our clinic review tool, if applicable. No additional management support is needed unless otherwise documented below in the visit note. 

## 2013-07-20 NOTE — Assessment & Plan Note (Signed)
bp not optimal and pt has a dry cough Will change lisinopril to losartan  F/u 4-6 wk-update if any problems

## 2013-07-20 NOTE — Assessment & Plan Note (Signed)
Neg tinel but pos phalen test-both hands  Nl neck exam  Nl neuo exam otherwise Suspect poss nerve compression - will ref to neurol for eval /poss NCV  Update if worse and did rev s/s of cva -see inst

## 2013-07-20 NOTE — Progress Notes (Signed)
Subjective:    Patient ID: [name], female    DOB: [birth year], 80 y.o.   MRN: [id number]  HPI Here with a funny sensation in her L hand - goes up into her arm at times- almost to elbow It "goes to sleep" and then wakes up and tingles  Happening for about a week - not as bad at night  Right hand started feeling a little tingly this am   No loss of grip   No slurred speech or facial droop or headache or dizziness or other symptoms   No repedetive hand work  Was just in the mt - and no specific work however  No hx of carpal tunnel syndrome  No neck symptoms   bp is up today- she did not forget her medicine  BP Readings from Last 3 Encounters:  07/20/13 150/96  06/02/13 142/76  04/19/13 146/90    Needs refill of tramadol today    Patient Active Problem List   Diagnosis Date Noted  . Heme + stool 06/02/2013  . Viral gastroenteritis 04/19/2013  . Colon cancer screening 04/19/2013  . At risk for falling 12/02/2011  . Hyperparathyroidism 11/20/2011  . Back pain, thoracic 09/30/2011  . Scoliosis 09/30/2011  . Degenerative disc disease, lumbar 09/30/2011  . Renal insufficiency 05/27/2011  . Syncope 12/05/2010  . CVA 02/26/2008  . HYPERLIPIDEMIA 05/12/2006  . HYPERTENSION 05/12/2006  . DIVERTICULOSIS, COLON 05/12/2006  . OSTEOPENIA 05/12/2006   Past Medical History  Diagnosis Date  . Hypertension   . Hyperlipidemia   . Osteopenia   . Diverticulosis   . DDD (degenerative disc disease)   . Chronic pain   . Stroke   . Syncope    Past Surgical History  Procedure Laterality Date  . Abdominal hysterectomy    . Tonsillectomy    . Exploratory laparotomy      2 times both neg   . Spine surgery      l3-l4 microdiscectomy   . Laminectomy  06-2003    l2-l4  . Doppler echocardiography      normal  . Carotid doppler      normal   History  Substance Use Topics  . Smoking status: Never Smoker   . Smokeless tobacco: Never Used  . Alcohol Use: No   Family  History  Problem Relation Age of Onset  . Hypertension Father   . COPD Father    Allergies  Allergen Reactions  . Celecoxib Other (See Comments)    REACTION: affects kidneys  . Codeine Nausea Only    And dizziness  . Etodolac Rash   Current Outpatient Prescriptions on File Prior to Visit  Medication Sig Dispense Refill  . Ascorbic Acid (VITAMIN C PO) Take 1 tablet by mouth.      . calcium-vitamin D (OSCAL WITH D) 250-125 MG-UNIT per tablet Take 1 tablet by mouth daily.      . Cholecalciferol (VITAMIN D PO) Take 1 tablet by mouth daily.      . fish oil-omega-3 fatty acids 1000 MG capsule Take 1 g by mouth daily.      Marland Kitchen. lisinopril (PRINIVIL,ZESTRIL) 10 MG tablet Take 20 mg by mouth daily.      . traMADol (ULTRAM) 50 MG tablet Take 50 mg by mouth daily.        No current facility-administered medications on file prior to visit.      Review of Systems Review of Systems  Constitutional: Negative for fever, appetite change, fatigue and unexpected  weight change.  Eyes: Negative for pain and visual disturbance.  Respiratory: Negative for cough and shortness of breath.   Cardiovascular: Negative for cp or palpitations    Gastrointestinal: Negative for nausea, diarrhea and constipation.  Genitourinary: Negative for urgency and frequency.  Skin: Negative for pallor or rash   Neurological: Negative for weakness, light-headedness, and headaches. pos for bilat hand numbness and tingling (worse on the L) Hematological: Negative for adenopathy. Does not bruise/bleed easily.  Psychiatric/Behavioral: Negative for dysphoric mood. The patient is not nervous/anxious.         Objective:   Physical Exam  Constitutional: She appears well-developed and well-nourished. No distress.  HENT:  Head: Normocephalic and atraumatic.  Mouth/Throat: Oropharynx is clear and moist.  No facial droop  Eyes: Conjunctivae and EOM are normal. Pupils are equal, round, and reactive to light. Right eye exhibits no  discharge. Left eye exhibits no discharge. No scleral icterus.  Neck: Normal range of motion. Neck supple. No JVD present. Carotid bruit is not present. No thyromegaly present.  Cardiovascular: Normal rate, regular rhythm, normal heart sounds and intact distal pulses.  Exam reveals no gallop.   Pulmonary/Chest: Effort normal and breath sounds normal. No respiratory distress. She has no wheezes. She has no rales.  Abdominal: Soft. Bowel sounds are normal. She exhibits no abdominal bruit.  Musculoskeletal: She exhibits no edema.  Lymphadenopathy:    She has no cervical adenopathy.  Neurological: She is alert. She has normal strength and normal reflexes. She displays no atrophy. No cranial nerve deficit or sensory deficit. She exhibits normal muscle tone. Coordination and gait normal.  Nl speech   bilat wrists neg tinel test an pos phalen test causing n/t  Grip is symmetric   Skin: Skin is warm and dry. No rash noted. No erythema.  Psychiatric: She has a normal mood and affect.          Assessment & Plan:   Problem List Items Addressed This Visit     Cardiovascular and Mediastinum   HYPERTENSION     bp not optimal and pt has a dry cough Will change lisinopril to losartan  F/u 4-6 wk-update if any problems     Relevant Medications      losartan (COZAAR) tablet     Other   Numbness in both hands - Primary     Neg tinel but pos phalen test-both hands  Nl neck exam  Nl neuo exam otherwise Suspect poss nerve compression - will ref to neurol for eval /poss NCV  Update if worse and did rev s/s of cva -see inst    Relevant Orders      Ambulatory referral to Neurology   Cough     Dry w/o other symptoms Suspect ace cough Change to ARB and udpate

## 2013-07-20 NOTE — Assessment & Plan Note (Signed)
Dry w/o other symptoms Suspect ace cough Change to ARB and udpate

## 2013-08-16 ENCOUNTER — Other Ambulatory Visit: Payer: Self-pay | Admitting: Family Medicine

## 2013-08-16 NOTE — Telephone Encounter (Signed)
Px written for call in   

## 2013-08-16 NOTE — Telephone Encounter (Signed)
Last filled 04/13/13 with 3 refills--pt has a f/u appt with you tomorrow 08/17/13--please advise if okay to call in

## 2013-08-16 NOTE — Telephone Encounter (Signed)
Rx called in to pharmacy. 

## 2013-08-17 ENCOUNTER — Ambulatory Visit (INDEPENDENT_AMBULATORY_CARE_PROVIDER_SITE_OTHER): Payer: Medicare Other | Admitting: Family Medicine

## 2013-08-17 ENCOUNTER — Encounter: Payer: Self-pay | Admitting: Family Medicine

## 2013-08-17 VITALS — BP 138/82 | HR 83 | Temp 98.5°F | Ht 65.0 in | Wt 153.2 lb

## 2013-08-17 DIAGNOSIS — R059 Cough, unspecified: Secondary | ICD-10-CM

## 2013-08-17 DIAGNOSIS — R05 Cough: Secondary | ICD-10-CM

## 2013-08-17 DIAGNOSIS — I10 Essential (primary) hypertension: Secondary | ICD-10-CM

## 2013-08-17 NOTE — Progress Notes (Signed)
Subjective:    Patient ID: Olivia BiblePeggie J Bishop, female    DOB: [birth year], 80 y.o.   MRN: [id number]  HPI Here s/p change of bp med - (d/c lisinopril for cough) Overall better  Then caught a cold and coughed with that - took mucinex dm   Now on losartan No side eff or problems BP Readings from Last 3 Encounters:  08/17/13 142/92  07/20/13 150/96  06/02/13 142/76    On 2nd check here 138/82   Hands still bother her - has appt with neurology on 7/10   Patient Active Problem List   Diagnosis Date Noted  . Numbness in both hands 07/20/2013  . Cough 07/20/2013  . Heme + stool 06/02/2013  . Viral gastroenteritis 04/19/2013  . Colon cancer screening 04/19/2013  . At risk for falling 12/02/2011  . Hyperparathyroidism 11/20/2011  . Back pain, thoracic 09/30/2011  . Scoliosis 09/30/2011  . Degenerative disc disease, lumbar 09/30/2011  . Renal insufficiency 05/27/2011  . Syncope 12/05/2010  . CVA 02/26/2008  . HYPERLIPIDEMIA 05/12/2006  . HYPERTENSION 05/12/2006  . DIVERTICULOSIS, COLON 05/12/2006  . OSTEOPENIA 05/12/2006   Past Medical History  Diagnosis Date  . Hypertension   . Hyperlipidemia   . Osteopenia   . Diverticulosis   . DDD (degenerative disc disease)   . Chronic pain   . Stroke   . Syncope    Past Surgical History  Procedure Laterality Date  . Abdominal hysterectomy    . Tonsillectomy    . Exploratory laparotomy      2 times both neg   . Spine surgery      l3-l4 microdiscectomy   . Laminectomy  06-2003    l2-l4  . Doppler echocardiography      normal  . Carotid doppler      normal   History  Substance Use Topics  . Smoking status: Never Smoker   . Smokeless tobacco: Never Used  . Alcohol Use: No   Family History  Problem Relation Age of Onset  . Hypertension Father   . COPD Father    Allergies  Allergen Reactions  . Celecoxib Other (See Comments)    REACTION: affects kidneys  . Codeine Nausea Only    And dizziness  . Lisinopril    cough  . Etodolac Rash   Current Outpatient Prescriptions on File Prior to Visit  Medication Sig Dispense Refill  . Ascorbic Acid (VITAMIN C PO) Take 1 tablet by mouth.      . calcium-vitamin D (OSCAL WITH D) 250-125 MG-UNIT per tablet Take 1 tablet by mouth daily.      . Cholecalciferol (VITAMIN D PO) Take 1 tablet by mouth daily.      . clopidogrel (PLAVIX) 75 MG tablet Take 75 mg by mouth daily with breakfast.      . fish oil-omega-3 fatty acids 1000 MG capsule Take 1 g by mouth daily.      Marland Kitchen. losartan (COZAAR) 50 MG tablet Take 1 tablet (50 mg total) by mouth daily.  30 tablet  11  . traMADol (ULTRAM) 50 MG tablet TAKE 1 TABLET BY MOUTH EVERY 6 HOURS AS NEEDED FOR PAIN  30 tablet  0   No current facility-administered medications on file prior to visit.    Review of Systems Review of Systems  Constitutional: Negative for fever, appetite change, fatigue and unexpected weight change.  Eyes: Negative for pain and visual disturbance.  Respiratory: Negative for cough and shortness of breath.   Cardiovascular: Negative  for cp or palpitations    Gastrointestinal: Negative for nausea, diarrhea and constipation.  Genitourinary: Negative for urgency and frequency.  Skin: Negative for pallor or rash   Neurological: Negative for weakness, light-headedness,  and headaches. pos for hand numbness and tingling  Hematological: Negative for adenopathy. Does not bruise/bleed easily.  Psychiatric/Behavioral: Negative for dysphoric mood. The patient is not nervous/anxious.         Objective:   Physical Exam  Constitutional: She appears well-developed and well-nourished. No distress.  HENT:  Head: Normocephalic and atraumatic.  Eyes: Conjunctivae and EOM are normal. Pupils are equal, round, and reactive to light.  Neck: Normal range of motion. Neck supple. No JVD present. Carotid bruit is not present.  Cardiovascular: Normal rate, regular rhythm and intact distal pulses.  Exam reveals no gallop.     Pulmonary/Chest: Effort normal and breath sounds normal. No respiratory distress. She has no wheezes. She has no rales.  No crackles   Abdominal: Soft. Bowel sounds are normal.  Musculoskeletal: She exhibits no edema.  Lymphadenopathy:    She has no cervical adenopathy.  Neurological: She is alert. She has normal reflexes.  Skin: Skin is warm and dry. No rash noted.  Psychiatric: She has a normal mood and affect.          Assessment & Plan:   Problem List Items Addressed This Visit     Cardiovascular and Mediastinum   HYPERTENSION - Primary      bp in fair control at this time after changing ace to arb due to cough  BP Readings from Last 1 Encounters:  08/17/13 138/82   No changes needed Disc lifstyle change with low sodium diet and exercise   Will continue to follow  F/u in Feb as planned       Other   Cough     Improved after changing ace to arb  Did cough with uri but that is better also Reassuring exam Will update if this returns

## 2013-08-17 NOTE — Assessment & Plan Note (Signed)
Improved after changing ace to arb  Did cough with uri but that is better also Reassuring exam Will update if this returns

## 2013-08-17 NOTE — Progress Notes (Signed)
Pre visit review using our clinic review tool, if applicable. No additional management support is needed unless otherwise documented below in the visit note. 

## 2013-08-17 NOTE — Patient Instructions (Signed)
I'm glad your cough is improved and blood pressure is controlled with losartan  Stay on current medicines See Dr Allena KatzPatel for your hands as planned

## 2013-08-17 NOTE — Assessment & Plan Note (Signed)
bp in fair control at this time after changing ace to arb due to cough  BP Readings from Last 1 Encounters:  08/17/13 138/82   No changes needed Disc lifstyle change with low sodium diet and exercise   Will continue to follow  F/u in Feb as planned

## 2013-08-20 ENCOUNTER — Encounter: Payer: Self-pay | Admitting: Neurology

## 2013-08-20 ENCOUNTER — Ambulatory Visit (INDEPENDENT_AMBULATORY_CARE_PROVIDER_SITE_OTHER): Payer: Medicare Other | Admitting: Neurology

## 2013-08-20 VITALS — BP 158/100 | HR 103 | Ht 64.96 in | Wt 151.6 lb

## 2013-08-20 DIAGNOSIS — R269 Unspecified abnormalities of gait and mobility: Secondary | ICD-10-CM

## 2013-08-20 DIAGNOSIS — G609 Hereditary and idiopathic neuropathy, unspecified: Secondary | ICD-10-CM

## 2013-08-20 DIAGNOSIS — R209 Unspecified disturbances of skin sensation: Secondary | ICD-10-CM

## 2013-08-20 MED ORDER — GABAPENTIN 100 MG PO CAPS: ORAL_CAPSULE | ORAL | Status: DC

## 2013-08-20 NOTE — Patient Instructions (Addendum)
1.  EMG bilateral arms 2.  Check vitamin B12 and copper 3.  Start gabapentin 100mg  at bedtime x 3 days, if tolerating (not too sleepy), then increase to two tablets at bedtime x 3 days, then increase to 3 tablets and continue. 4.  Please, please, please use a cane or walker when you are walking 5.  Return to clinic in 6-weeks  [facility]  Preventing Falls in the Home   Falls are common, often dreaded events in the lives of older people. Aside from the obvious injuries and even death that may result, falls can cause wide-ranging consequences including loss of independence, mental decline, decreased activity, and mobility. Younger people are also at risk of falling, especially those with chronic illnesses and fatigue.  Ways to reduce the risk for falling:  * Examine diet and medications. Warm foods and alcohol dilate blood vessels, which can lead to dizziness when standing. Sleep aids, antidepressants, and pain medications can also increase the likelihood of a fall.  * Get a vison exam. Poor vision, cataracts, and glaucoma increase the chances of falling.  * Check foot gear. Shoes should fit snugly and have a sturdy, nonskid sole and broad, low heel.  * Participate in a physician-approved exercise program to build and maintain muscle strength and improve balance and coordination.  * Increase vitamin D intake. Vitamin D improves muscle strength and increases the amount of calcium the body is able to absorb and deposit in bones.  How to prevent falls from common hazards:  * Floors - Remove all loose wires, cords, and throw rugs. Minimize clutter. Make sure rugs are anchored and smooth. Keep furniture in its usual place.  * Chairs - Use chairs with straight backs, armrests, and firm seats. Add firm cushions to existing pieces to add height.  * Bathroom - Install grab bars and non-skid tape in the tub or shower. Use a bathtub transfer bench or a shower chair with a back support. Use an elevated  toilet seat and/or safety rails to assist standing from a low surface. Do not use towel racks or bathroom tissue holders to help you stand.  * Lighting - Make sure halls, stairways, and entrances are well-lit. Install a night light in your bathroom or hallway. Make sure there is a light switch at the top and bottom of the staircase. Turn lights on if you get up in the middle of the night. Make sure lamps or light switches are within reach of the bed if you have to get up during the night.  * Kitchen - Install non-skid rubber mats near the sink and stove. Clean spills immediately. Store frequently used utensils, pots, and pans between waist and eye level. This helps prevent reaching and bending. Sit when getting things out of the lower cupboards.  * Living room / Bedrooms - Place furniture with wide spaces in between, giving enough room to move around. Establish a route through the living room that gives you something to hold onto as you walk.  * Stairs - Make sure treads, rails, and rugs are secure. Install a rail on both sides of the stairs. If stairs are a threat, it might be helpful to arrange most of your activities on the lower level to reduce the number of times you must climb the stairs.  * Entrances and doorways - Install metal handles on the walls adjacent to the doorknobs of all doors to make it more secure as you travel through the doorway.  Tips for  maintaining balance:  * Keep at least one hand free at all times Try using a backpack or fanny pack to hold things rather than carrying them in your hands. Never carry objects in both hands when walking as this interferes with keeping your balance.  * Attempt to swing both arms from front to back while walking. This might require a conscious effort if Parkinson's disease has diminished your movement. It will, however, help you to maintain balance and posture, and reduce fatigue.  * Consciously lift your feet off the ground when walking. Shuffling and  dragging of the feet is a common culprit in losing your balance.  * When trying to navigate turns, use a "U" technique of facing forward and making a wide turn, rather than pivoting sharply.  * Try to stand with your feet shoulder-length apart. When your feet are close together for any length of time, you increase your risk of losing your balance and falling.  * Do one thing at a time. Do not try to walk and accomplish another task, such as reading or looking around. The decrease in your automatic reflexes complicates motor function, so the less distraction, the better.  * Do not wear rubber or gripping soled shoes, they might "catch" on the floor and cause tripping.  * Move slowly when changing positions. Use deliberate, concentrated movements and, if needed, use a grab bar or walking aid. Count fifteen (15) seconds after standing to begin walking.  * If balance is a continuous problem, you might want to consider a walking aid such as a cane, walking stick, or walker. Once you have mastered walking with help, you may be ready to try it again on your own.  This information is provided by [facility] and is not intended to replace the medical advice of your physician or other health care providers. Please consult your physician or other health care providers for advice regarding your specific medical condition.    Life Alert Resources  Medical Alert Systems that can locate patients outside the home:  http://mobilealertsystems.com/ 903-488-87371-845-537-7400 http://www.lifelinesys.com/content/lifeline-products/get-life-gosafe  917 206 17551-(216) 787-9763 www.verizonwireless.com/sure/ 505-735-16931-781-572-7501  Medial Alert systems that link to smart phones:  http://www.lifelinesys.com/content/lifeline-products/response-app https://www.montgomery-brown.info/http://www.greatcall.com/medical-apps/Fivestar https://www.bond-cox.org/http://www.lifealert.com/EmergencyMobileApp.aspx  Alzheimer's Association GPS Tracker:  http://www.gould-leon.com/http://www.alz.org/comfortzone/ (947)713-48971.740 713 6373

## 2013-08-20 NOTE — Progress Notes (Signed)
[facility] Clinic Note - Initial Visit   Date: 08/20/2013  [name] MRN: [id number] DOB: [birth year]   Dear Dr Milinda Antis:  Thank you for your kind referral of [name] for consultation of tinging of the hands. Although her history is well known to you, please allow [place] to reiterate it for the purpose of our medical record. The patient was accompanied to the clinic by niece (Tammy) who also provides collateral information.     History of Present Illness: [name] is a 80 y.o. right-handed Caucasian female with history of hypertension, stroke (2013, manifesting with dysarthria and left homonymous hemianopia, no residual symptoms), hypertension, and hyperlipidemia presenting for evaluation of tingling of the hands.    Around May 2015, she developed numbness and tingling of the fingertips which initially started on the left hand and then involved the right.  Symptoms are worse in the morning and improves as the day goes on.  She denies any exacerbating/alleviating factors. She denies any weakness of the hands or neck pain.    No numbness/tingling of the feet.   She does reports difficulty with walking and loss of balance, especially when on uneven surfaces.  No recent falls because she is extra cautious.   Out-side paper records, electronic medical record, and images have been reviewed where available and summarized as:  Lab Results  Component Value Date   TSH 0.70 03/17/2013   Lab Results  Component Value Date   HGBA1C 5.9* 11/17/2011   MRI brain wo contrast 11/17/2011: No acute or subacute infarction.  Old inferior cerebellar infarction on the left.  Chronic small vessel changes of the pons in the cerebral hemispheric white matter, fairly typical for age.  MRA head 11/17/2011: No major vessel occlusion.  No significant anterior circulation stenotic disease.  40% stenosis of the basilar artery in the proximal third.  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   Past Medical History  Diagnosis Date  . Hypertension   . Hyperlipidemia   . Osteopenia   . Diverticulosis   . DDD (degenerative disc disease)   . Chronic pain   . Stroke   . Syncope     Past Surgical History  Procedure Laterality Date  . Abdominal hysterectomy    . Tonsillectomy    . Exploratory laparotomy      2 times both neg   . Spine surgery      l3-l4 microdiscectomy   . Laminectomy  06-2003    l2-l4  . Doppler echocardiography      normal  . Carotid doppler      normal     Medications:  Current Outpatient Prescriptions on File Prior to Visit  Medication Sig Dispense Refill  . Ascorbic Acid (VITAMIN C PO) Take 1 tablet by mouth.      . calcium-vitamin D (OSCAL WITH D) 250-125 MG-UNIT per tablet Take 1 tablet by mouth daily.      . Cholecalciferol (VITAMIN D PO) Take 1 tablet by mouth daily.      . clopidogrel (PLAVIX) 75 MG tablet Take 75 mg by mouth daily with breakfast.      . fish oil-omega-3 fatty acids 1000 MG capsule Take 1 g by mouth daily.      Marland Kitchen losartan (COZAAR) 50 MG tablet Take 1 tablet (50 mg total) by mouth daily.  30 tablet  11  . traMADol (ULTRAM) 50 MG tablet TAKE 1 TABLET BY MOUTH  EVERY 6 HOURS AS NEEDED FOR PAIN  30 tablet  0   No current facility-administered medications on file prior to visit.    Allergies:  Allergies  Allergen Reactions  . Celecoxib Other (See Comments)    REACTION: affects kidneys  . Codeine Nausea Only    And dizziness  . Lisinopril     cough  . Etodolac Rash    Family History: Family History  Problem Relation Age of Onset  . Hypertension Father   . COPD Father   . Other Mother     Deceased, 103  . Healthy Brother     Social History: History   Social History  . Marital Status: Single    Spouse Name: N/A    Number of Children: N/A  . Years of Education: N/A   Occupational History  . retired    Social History Main Topics  . Smoking  status: Never Smoker   . Smokeless tobacco: Never Used  . Alcohol Use: No  . Drug Use: No  . Sexual Activity: Not on file   Other Topics Concern  . Not on file   Social History Narrative   She lives alone in a [place] mobile home.   She worked as a Teacher, early years/pre at [employer] level of education:  12th grade    Review of Systems:  CONSTITUTIONAL: No fevers, chills, night sweats, or weight loss.   EYES: No visual changes or eye pain ENT: No hearing changes.  No history of nose bleeds.   RESPIRATORY: No cough, wheezing and shortness of breath.   CARDIOVASCULAR: Negative for chest pain, and palpitations.   GI: Negative for abdominal discomfort, blood in stools or black stools.  No recent change in bowel habits.   GU:  No history of incontinence.   MUSCLOSKELETAL: +history of joint pain or swelling.  No myalgias.   SKIN: Negative for lesions, rash, and itching.   HEMATOLOGY/ONCOLOGY: Negative for prolonged bleeding, bruising easily, and swollen nodes.     ENDOCRINE: Negative for cold or heat intolerance, polydipsia or goiter.   PSYCH:  No depression or anxiety symptoms.   NEURO: As Above.   Vital Signs:  BP 158/100  Pulse 103  Ht 5' 4.96" (1.65 m)  Wt 151 lb 9 oz (68.748 kg)  BMI 25.25 kg/m2  SpO2 97% Pain Scale: 0 on a scale of 0-10   General Medical Exam:   General:  Well appearing, comfortable.   Eyes/ENT: see cranial nerve examination.   Neck: No masses appreciated.  Full range of motion without tenderness.  No carotid bruits. Respiratory:  Clear to auscultation, good air entry bilaterally.   Cardiac:  Regular rate and rhythm, no murmur.   Extremities:  No deformities, edema, or skin discoloration. Good capillary refill.   Skin:  Skin color, texture, turgor normal. No rashes or lesions.  Neurological Exam: MENTAL STATUS including orientation to time, place, person, recent and remote memory, attention span and concentration,  language, and fund of knowledge is normal.  Speech is not dysarthric.  CRANIAL NERVES: II:  No visual field defects.  Unremarkable fundi.   III-IV-VI: Pupils equal round and reactive to light.  Normal conjugate, extra-ocular eye movements in all directions of gaze.  No nystagmus.  No ptosis.   V:  Normal facial sensation.   VII:  Normal facial symmetry and movements.    VIII:  Reduced hearing bilaterally to finger rub.   IX-X:  Normal palatal movement.  XI:  Normal shoulder shrug and head rotation.   XII:  Normal tongue strength and range of motion, no deviation or fasciculation.  MOTOR:  No atrophy, fasciculations or abnormal movements.  No pronator drift.  Tone is normal.    Right Upper Extremity:    Left Upper Extremity:    Deltoid  5/5   Deltoid  5/5   Biceps  5/5   Biceps  5/5   Triceps  5/5   Triceps  5/5   Wrist extensors  5/5   Wrist extensors  5/5   Wrist flexors  5/5   Wrist flexors  5/5   Finger extensors  5/5   Finger extensors  5/5   Finger flexors  5/5   Finger flexors  5/5   Dorsal interossei  5/5   Dorsal interossei  5/5   Abductor pollicis  5-/5   Abductor pollicis  4+5   Tone (Ashworth scale)  0  Tone (Ashworth scale)  0   Right Lower Extremity:    Left Lower Extremity:    Hip flexors  5-/5   Hip flexors  5-/5   Hip extensors  5/5   Hip extensors  5/5   Knee flexors  5/5   Knee flexors  5/5   Knee extensors  5/5   Knee extensors  5/5   Dorsiflexors  5/5   Dorsiflexors  5/5   Plantarflexors  5/5   Plantarflexors  5/5   Toe extensors  5/5   Toe extensors  5/5   Toe flexors  5/5   Toe flexors  5/5   Tone (Ashworth scale)  0  Tone (Ashworth scale)  0   MSRs:  Right                                                                 Left brachioradialis 2+  brachioradialis 2+  biceps 2+  biceps 2+  triceps 2+  triceps 2+  patellar 1+  patellar 1+  ankle jerk 1+  ankle jerk 1+  Hoffman no  Hoffman no  plantar response down  plantar response down   SENSORY:  Pin  prick and vibration is absent distal to ankles bilaterally. Reduced temperature and pin prick on the left side.. Romberg's sign positive.   COORDINATION/GAIT: Mild dysmetria with left finger to nose testing.  Intact rapid alternating movements bilaterally.  Gait appears ataxic and unsteady.      IMPRESSION/PLAN: Paresthesias of the hands EMG to determine if this is related to progression of underlying neuropathy given glove-stocking pattern of findings vs CTS vs radiculopathy Start neurontin 100mg  qhs and uptitrate to 300mg  at bedtime.  Risks and benefits dicussed  Large fiber neuropathy affecting the feet, likely idiopathic, and contributing to sensory ataxia/gait instability I had extensive discussion with the patient regarding the pathogenesis, etiology, management, and natural course of neuropathy. Neuropathy tends to be slowly progressive, especially if a treatable etiology is not identified.  I would like to check vitamin B12 and copper.  Fall precautions discussed at length and I strongly encouraged her to use a cane/walker Information provided on LifeAlert system  History of left posterior frontal and parietal infarction (2010), no residual deficits Continue plavix and secondary risk prevention  Return to clinic in 6 weeks  The duration of this appointment visit was 60 minutes of face-to-face time with the patient.  Greater than 50% of this time was spent in counseling, explanation of diagnosis, planning of further management, and coordination of care.   Thank you for allowing me to participate in patient's care.  If I can answer any additional questions, I would be pleased to do so.    Sincerely,    Pieper Kasik K. Posey Pronto, DO

## 2013-08-21 LAB — VITAMIN B12: Vitamin B-12: 254 pg/mL (ref 211–911)

## 2013-08-23 LAB — COPPER, SERUM: Copper: 117 ug/dL (ref 70–175)

## 2013-08-30 ENCOUNTER — Ambulatory Visit (INDEPENDENT_AMBULATORY_CARE_PROVIDER_SITE_OTHER): Payer: Medicare Other | Admitting: *Deleted

## 2013-08-30 DIAGNOSIS — E538 Deficiency of other specified B group vitamins: Secondary | ICD-10-CM

## 2013-08-30 MED ORDER — CYANOCOBALAMIN 1000 MCG/ML IJ SOLN
1000.0000 ug | Freq: Once | INTRAMUSCULAR | Status: AC
  Administered 2013-08-30: 1000 ug via INTRAMUSCULAR

## 2013-08-30 NOTE — Progress Notes (Signed)
Patient in for B12 injection. 

## 2013-08-31 ENCOUNTER — Ambulatory Visit (INDEPENDENT_AMBULATORY_CARE_PROVIDER_SITE_OTHER): Payer: Medicare Other | Admitting: *Deleted

## 2013-08-31 DIAGNOSIS — E538 Deficiency of other specified B group vitamins: Secondary | ICD-10-CM

## 2013-08-31 MED ORDER — CYANOCOBALAMIN 1000 MCG/ML IJ SOLN
1000.0000 ug | Freq: Once | INTRAMUSCULAR | Status: AC
  Administered 2013-08-31: 1000 ug via INTRAMUSCULAR

## 2013-08-31 NOTE — Progress Notes (Signed)
Patient in for B12 injection. 

## 2013-09-01 ENCOUNTER — Ambulatory Visit (INDEPENDENT_AMBULATORY_CARE_PROVIDER_SITE_OTHER): Payer: Medicare Other | Admitting: Neurology

## 2013-09-01 ENCOUNTER — Telehealth: Payer: Self-pay

## 2013-09-01 DIAGNOSIS — D518 Other vitamin B12 deficiency anemias: Secondary | ICD-10-CM

## 2013-09-01 DIAGNOSIS — E538 Deficiency of other specified B group vitamins: Secondary | ICD-10-CM

## 2013-09-01 MED ORDER — CYANOCOBALAMIN 1000 MCG/ML IJ SOLN
1000.0000 ug | Freq: Once | INTRAMUSCULAR | Status: AC
  Administered 2013-09-01: 1000 ug via INTRAMUSCULAR

## 2013-09-01 NOTE — Progress Notes (Signed)
B12 injection to left gluteus with no apparent complication. Patient to return tomorrow for repeat injection.

## 2013-09-01 NOTE — Telephone Encounter (Signed)
Pt left v/m requesting cb to verify what meds pt is supposed to be taking. Pt picked up med at [facility] and pt is not sure if she should be taking. Tried to contact pt and phone busy will try again.

## 2013-09-02 ENCOUNTER — Ambulatory Visit (INDEPENDENT_AMBULATORY_CARE_PROVIDER_SITE_OTHER): Payer: Medicare Other | Admitting: Family Medicine

## 2013-09-02 DIAGNOSIS — E538 Deficiency of other specified B group vitamins: Secondary | ICD-10-CM

## 2013-09-02 MED ORDER — CYANOCOBALAMIN 1000 MCG/ML IJ SOLN: 1000.0000 ug | Freq: Once | INTRAMUSCULAR | Status: DC

## 2013-09-02 MED ORDER — CYANOCOBALAMIN 1000 MCG/ML IJ SOLN
1000.0000 ug | Freq: Once | INTRAMUSCULAR | Status: AC
  Administered 2013-09-02: 1000 ug via INTRAMUSCULAR

## 2013-09-02 NOTE — Progress Notes (Signed)
Vitamin B12 injection. No complications. Patient will return tomorrow for repeat injection.

## 2013-09-03 ENCOUNTER — Ambulatory Visit (INDEPENDENT_AMBULATORY_CARE_PROVIDER_SITE_OTHER): Payer: Medicare Other | Admitting: *Deleted

## 2013-09-03 DIAGNOSIS — E538 Deficiency of other specified B group vitamins: Secondary | ICD-10-CM

## 2013-09-03 MED ORDER — CYANOCOBALAMIN 1000 MCG/ML IJ SOLN
1000.0000 ug | Freq: Once | INTRAMUSCULAR | Status: AC
  Administered 2013-09-03: 1000 ug via INTRAMUSCULAR

## 2013-09-03 NOTE — Progress Notes (Signed)
Patient in for B12 injection. 

## 2013-09-06 ENCOUNTER — Ambulatory Visit (INDEPENDENT_AMBULATORY_CARE_PROVIDER_SITE_OTHER): Payer: Medicare Other | Admitting: *Deleted

## 2013-09-06 DIAGNOSIS — E538 Deficiency of other specified B group vitamins: Secondary | ICD-10-CM

## 2013-09-06 MED ORDER — CYANOCOBALAMIN 1000 MCG/ML IJ SOLN
1000.0000 ug | Freq: Once | INTRAMUSCULAR | Status: AC
  Administered 2013-09-06: 1000 ug via INTRAMUSCULAR

## 2013-09-06 NOTE — Progress Notes (Signed)
Patient in for B12 injection. 

## 2013-09-07 ENCOUNTER — Ambulatory Visit: Payer: Medicare Other

## 2013-09-13 ENCOUNTER — Other Ambulatory Visit: Payer: Self-pay | Admitting: Family Medicine

## 2013-09-13 NOTE — Telephone Encounter (Signed)
Px written for call in   

## 2013-09-13 NOTE — Telephone Encounter (Signed)
Rx called to pharmacy as instructed. 

## 2013-09-13 NOTE — Telephone Encounter (Signed)
Electronic refill request, please advise  

## 2013-09-15 ENCOUNTER — Ambulatory Visit (INDEPENDENT_AMBULATORY_CARE_PROVIDER_SITE_OTHER): Payer: Medicare Other | Admitting: Neurology

## 2013-09-15 DIAGNOSIS — R209 Unspecified disturbances of skin sensation: Secondary | ICD-10-CM

## 2013-09-15 DIAGNOSIS — G609 Hereditary and idiopathic neuropathy, unspecified: Secondary | ICD-10-CM

## 2013-09-15 DIAGNOSIS — R269 Unspecified abnormalities of gait and mobility: Secondary | ICD-10-CM

## 2013-09-15 MED ORDER — GABAPENTIN 300 MG PO CAPS: 300.0000 mg | ORAL_CAPSULE | Freq: Every day | ORAL | Status: DC

## 2013-09-15 NOTE — Procedures (Signed)
[facility]  [address] [place], [address]  [place], [place] [address] Tel: [phone] Fax:  [phone] Test Date:  09/15/2013  Patient: [name] DOB: 09-23-1930 Physician: Nita Sickle  Sex: Female Height: 5\' 9"  Ref Phys: Nita Sickle  ID#: [id number] Temp: 32.0C Technician: Ala Bent R. NCS T.   Patient Complaints: This is an 80 year old female presenting for bilateral hand numbness and weakness.   NCV & EMG Findings: Extensive electrodiagnostic testing of the right and left upper extremities reveals:  1. Bilateral median, ulnar, and radial sensory responses are within normal limits. Bilateral palmar studies also within normal limits.  2. Bilateral median and ulnar motor responses are within normal limits.  3. There is no evidence of active or chronic motor axon loss changes affecting any of the tested muscles.   Impression: This is a normal study of the right and left upper extremities. In particular, there is no evidence of generalized sensorimotor polyneuropathy, cervical radiculopathy, or carpal tunnel syndrome.   ___________________________ Nita Sickle    Nerve Conduction Studies Anti Sensory Summary Table   Stim Site NR Peak (ms) Norm Peak (ms) P-T Amp (V) Norm P-T Amp  Left Median Anti Sensory (2nd Digit)  32C  Wrist    3.4 <3.8 24.6 >10  Right Median Anti Sensory (2nd Digit)  32C  Wrist    3.5 <3.8 14.1 >10  Left Radial Anti Sensory (Base 1st Digit)  32C  Wrist    2.6 <2.8 17.4 >10  Right Radial Anti Sensory (Base 1st Digit)  32C  Wrist    2.4 <2.8 16.2 >10  Left Ulnar Anti Sensory (5th Digit)  32C  Wrist    3.0 <3.2 21.2 >5  Right Ulnar Anti Sensory (5th Digit)  32C  Wrist    2.8 <3.2 13.7 >5   Motor Summary Table   Stim Site NR Onset (ms) Norm Onset (ms) O-P Amp (mV) Norm O-P Amp Site1 Site2 Delta-0 (ms) Dist (cm) Vel (m/s) Norm Vel (m/s)  Left Median Motor (Abd Poll Brev)  32C  Wrist    3.0 <4.0 8.2 >5 Elbow Wrist 5.2 29.0 56 >50    Elbow    8.2  7.3         Right Median Motor (Abd Poll Brev)  32C  Wrist    3.0 <4.0 5.6 >5 Elbow Wrist 5.0 29.0 58 >50  Elbow    8.0  4.7         Left Ulnar Motor (Abd Dig Minimi)  32C  Wrist    2.7 <3.1 8.1 >7 B Elbow Wrist 3.9 21.0 54 >50  B Elbow    6.6  7.6  A Elbow B Elbow 1.8 10.0 56 >50  A Elbow    8.4  7.3         Right Ulnar Motor (Abd Dig Minimi)  32C  Wrist    2.4 <3.1 7.8 >7 B Elbow Wrist 3.7 22.0 59 >50  B Elbow    6.1  7.1  A Elbow B Elbow 1.7 10.0 59 >50  A Elbow    7.8  6.9          Comparison Summary Table   Stim Site NR Peak (ms) Norm Peak (ms) P-T Amp (V) Site1 Site2 Delta-P (ms) Norm Delta (ms)  Left Median/Ulnar Palm Comparison (Wrist - 8cm)  32C  Median Palm    2.1 <2.2 44.9 Median Palm Ulnar Palm 0.2   Ulnar Palm    1.9 <2.2 24.7  Right Median/Ulnar Palm Comparison (Wrist - 8cm)  32C  Median Palm    2.1 <2.2 48.8 Median Palm Ulnar Palm 0.2   Ulnar Palm    1.9 <2.2 16.8       F Wave Studies   NR F-Lat (ms) Lat Norm (ms) L-R F-Lat (ms)  Left Ulnar (Mrkrs) (Abd Dig Min)  32C     29.63 <33 0.69  Right Ulnar (Mrkrs) (Abd Dig Min)  32C     28.93 <33 0.69   EMG   Side Muscle Ins Act Fibs Psw Fasc Number Recrt Dur Dur. Amp Amp. Poly Poly. Comment  Right 1stDorInt Nml Nml Nml Nml Nml Nml Nml Nml Nml Nml Nml Nml N/A  Right Ext Indicis Nml Nml Nml Nml Nml Nml Nml Nml Nml Nml Nml Nml N/A  Right PronatorTeres Nml Nml Nml Nml Nml Nml Nml Nml Nml Nml Nml Nml N/A  Right Biceps Nml Nml Nml Nml Nml Nml Nml Nml Nml Nml Nml Nml N/A  Right Triceps Nml Nml Nml Nml Nml Nml Nml Nml Nml Nml Nml Nml N/A  Right Deltoid Nml Nml Nml Nml Nml Nml Nml Nml Nml Nml Nml Nml N/A  Left 1stDorInt Nml Nml Nml Nml Nml Nml Nml Nml Nml Nml Nml Nml N/A  Left PronatorTeres Nml Nml Nml Nml Nml Nml Nml Nml Nml Nml Nml Nml N/A  Left Biceps Nml Nml Nml Nml Nml Nml Nml Nml Nml Nml Nml Nml N/A  Left Triceps Nml Nml Nml Nml Nml Nml Nml Nml Nml Nml Nml Nml N/A  Left Deltoid Nml Nml Nml  Nml Nml Nml Nml Nml Nml Nml Nml Nml N/A      Waveforms:

## 2013-09-30 NOTE — Telephone Encounter (Signed)
This message was off my desktop; spoke with pt; pt used to take lisinopril, when last picked up BP med pt was given Losartan. Pt wants to make sure losartan is the med she is supposed to be taking. Advised losartan is on med list and pt said that is the med she is presently taking. Sp[facility] with [facility]ist at [facility] and cancelled lisinopril refills. Pt is discarding the lisinopril she has at home so she will not get confused about taking it. Pt said she has no more questions and verbalized she is confident in med she is taking.

## 2013-10-12 ENCOUNTER — Ambulatory Visit (INDEPENDENT_AMBULATORY_CARE_PROVIDER_SITE_OTHER): Payer: Medicare Other | Admitting: Neurology

## 2013-10-12 ENCOUNTER — Encounter: Payer: Self-pay | Admitting: Neurology

## 2013-10-12 VITALS — BP 138/90 | HR 85 | Ht 64.0 in | Wt 156.3 lb

## 2013-10-12 DIAGNOSIS — E538 Deficiency of other specified B group vitamins: Secondary | ICD-10-CM

## 2013-10-12 DIAGNOSIS — R209 Unspecified disturbances of skin sensation: Secondary | ICD-10-CM

## 2013-10-12 DIAGNOSIS — R269 Unspecified abnormalities of gait and mobility: Secondary | ICD-10-CM

## 2013-10-12 MED ORDER — CYANOCOBALAMIN 1000 MCG/ML IJ SOLN
1000.0000 ug | Freq: Once | INTRAMUSCULAR | Status: AC
  Administered 2013-10-12: 1000 ug via INTRAMUSCULAR

## 2013-10-12 NOTE — Patient Instructions (Addendum)
1.  Continue vitamin B12 injections weekly x 4 weeks, then monthly 2.  Call my office after your rash has cleared up, so I can start nortripyline  at bedtime 3.  Physical therapy for gait training 4.  Please call with medication list so we can update our system 5.  We will request records from [facility] 6.  Return to clinic in 63-months

## 2013-10-12 NOTE — Progress Notes (Signed)
Follow-up Visit   Date: 10/13/2013    [name] MRN: [id number] DOB: 08-07-1930   Interim History: [name] is a 80 y.o. right-handed Caucasian female with history of hypertension, stroke (2013, manifesting with dysarthria and left homonymous hemianopia, no residual symptoms), hypertension, and hyperlipidemia returning to the clinic for follow-up of hand paresthesias.  The patient was accompanied to the clinic by niece (Tammy) who also provides collateral information.    History of present illness: Around May 2015, she developed numbness and tingling of the fingertips which initially started on the left hand and then involved the right. Symptoms are worse in the morning and improves as the day goes on. She denies any exacerbating/alleviating factors. She denies any weakness of the hands or neck pain.  No numbness/tingling of the feet. She does reports difficulty with walking and loss of   UPDATE 10/12/2013:  She is here to discuss results of her EMG which was normal (bilateral upper extremities). At her last visit, I recommended she start taking Neurontin to see if this will help her symptoms, but did not notice a significant change.  On 8/24, she developed a red rash over her arms, legs, and abdomen. Lesion are pustular with clear liquid and itchy.   Patient feels it is dust mites.  She did stop her neurontin last week to see if it was medication effect.      Medications:  Current Outpatient Prescriptions on File Prior to Visit  Medication Sig Dispense Refill  . Ascorbic Acid (VITAMIN C PO) Take 1 tablet by mouth.      . Cholecalciferol (VITAMIN D PO) Take 1 tablet by mouth daily.      . clopidogrel (PLAVIX) 75 MG tablet Take 75 mg by mouth daily with breakfast.      . fish oil-omega-3 fatty acids 1000 MG capsule Take 1 g by mouth daily.      Marland Kitchen gabapentin (NEURONTIN) 300 MG capsule Take 1 capsule (300 mg total) by mouth at bedtime.  30 capsule  3  . losartan (COZAAR) 50  MG tablet Take 1 tablet (50 mg total) by mouth daily.  30 tablet  11  . traMADol (ULTRAM) 50 MG tablet TAKE 1 TABLET BY MOUTH EVERY 6 HOURS AS NEEDED  30 tablet  2   No current facility-administered medications on file prior to visit.    Allergies:  Allergies  Allergen Reactions  . Celecoxib Other (See Comments)    REACTION: affects kidneys  . Codeine Nausea Only    And dizziness  . Lisinopril     cough  . Etodolac Rash     Review of Systems:  CONSTITUTIONAL: No fevers, chills, night sweats, or weight loss.   EYES: No visual changes or eye pain ENT: No hearing changes.  No history of nose bleeds.   RESPIRATORY: No cough, wheezing and shortness of breath.   CARDIOVASCULAR: Negative for chest pain, and palpitations.   GI: Negative for abdominal discomfort, blood in stools or black stools.  No recent change in bowel habits.   GU:  No history of incontinence.   MUSCLOSKELETAL: No history of joint pain or swelling.  No myalgias.   SKIN: Negative for lesions,+ rash, and itching.   ENDOCRINE: Negative for cold or heat intolerance, polydipsia or goiter.   PSYCH:  No depression or anxiety symptoms.   NEURO: As Above.   Vital Signs:  BP 138/90  Pulse 85  Ht  (1.626 m)  Wt 156 lb  5 oz (70.903 kg)  BMI 26.82 kg/m2  SpO2 95%  Ext: Erythematous papular rash over the extremities and torso Neurological Exam: MENTAL STATUS including orientation to time, place, person, recent and remote memory, attention span and concentration, language, and fund of knowledge is normal.  Speech is not dysarthric.  CRANIAL NERVES: Pupils equal round and reactive to light.  Normal conjugate, extra-ocular eye movements in all directions of gaze.  No ptosis. Normal facial sensation.  Face is symmetric. Palate elevates symmetrically.  Tongue is midline.  MOTOR:  Motor strength is 5/5 in all extremities, except bilateral ABP 5-/5.  No atrophy, fasciculations or abnormal movements.  No pronator drift.   Tone is normal.    MSRs:  Reflexes are 2+/4 in the upper extremities and 1+ in the lower extremities throughout.  SENSORY: Pin prick and vibration is absent distal to ankles bilaterally. Reduced temperature and pin prick on the left side.. Romberg's sign positive.   COORDINATION/GAIT: Mild dysmetria with left finger to nose testing. Intact rapid alternating movements bilaterally. Gait appears ataxic and unsteady.    Data: EMG of the upper extremities 09/15/2013: This is a normal study of the right and left upper extremities. In particular, there is no evidence of generalized sensorimotor polyneuropathy, cervical radiculopathy, or carpal tunnel syndrome.  Component     Latest Ref Rng 03/17/2013 08/20/2013  TSH     0.35 - 5.50 uIU/mL 0.70   Vit D, 25-Hydroxy     30 - 89 ng/mL 45   Vitamin B-12     211 - 911 pg/mL  254  Copper     70 - 175 mcg/dL  409   MRI brain wo contrast 11/17/2011:  No acute or subacute infarction.  Old inferior cerebellar infarction on the left.  Chronic small vessel changes of the pons in the cerebral hemispheric white matter, fairly typical for age.   MRA head 11/17/2011:  No major vessel occlusion.  No significant anterior circulation stenotic disease.  40% stenosis of the basilar artery in the proximal third.  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   IMPRESSION/PLAN: Paresthesias of the hands  Etiology is uncertain (? Low B12), as there is no neuropathy, CTS, or radiculopathy on EMG.   Because of recent rash, I do recommend that she stays off Neurontin for now  After the rash improves, may consider nortriptyline to see if this helps her paresthesias  Consider imaging the cervical spine going forward  Vitamin B12 deficiency Start vitamin B12 weekly x 4, then monthly  Large fiber neuropathy affecting the feet, likely idiopathic, and contributing to sensory ataxia/gait instability  Fall precautions  discussed at length and I strongly encouraged her to use a cane/walker  Recommended physical therapy for gait training  History of left posterior frontal and parietal infarction (2010), no residual deficits  Continue plavix and secondary risk prevention   Rash, ?drug-related Requested patient follow-up with PCP  Return to clinic in 46-months  The duration of this appointment visit was 30 minutes of face-to-face time with the patient.  Greater than 50% of this time was spent in counseling, explanation of diagnosis, planning of further management, and coordination of care.   Thank you for allowing me to participate in patient's care.  If I can answer any additional questions, I would be pleased to do so.    Sincerely,    Donika K. Allena Katz, DO

## 2013-10-12 NOTE — Progress Notes (Signed)
Patient in for B12 injection. 

## 2013-10-13 ENCOUNTER — Ambulatory Visit (INDEPENDENT_AMBULATORY_CARE_PROVIDER_SITE_OTHER): Payer: Medicare Other | Admitting: Family Medicine

## 2013-10-13 ENCOUNTER — Telehealth: Payer: Self-pay | Admitting: Neurology

## 2013-10-13 ENCOUNTER — Encounter: Payer: Self-pay | Admitting: *Deleted

## 2013-10-13 ENCOUNTER — Encounter: Payer: Self-pay | Admitting: Family Medicine

## 2013-10-13 ENCOUNTER — Other Ambulatory Visit: Payer: Self-pay | Admitting: *Deleted

## 2013-10-13 VITALS — BP 142/94 | HR 84 | Temp 98.0°F | Ht 64.0 in | Wt 155.5 lb

## 2013-10-13 DIAGNOSIS — R2681 Unsteadiness on feet: Secondary | ICD-10-CM

## 2013-10-13 DIAGNOSIS — R21 Rash and other nonspecific skin eruption: Secondary | ICD-10-CM

## 2013-10-13 NOTE — Patient Instructions (Signed)
I'm not sure what is causing your rash -but suspect it is an allergy to something Buy zyrtec 10 mg and take one pill daily-this is an antihistamine that should help rash and itching  Switch to a detergent with no color or fragrance (look for All Free) wherever you shop  Don't use fabiric softener Switch soap to dove for sensitive skin  Let me know in the next week if your rash does not improve -or if it gets worse or if you develop new symptoms like fever

## 2013-10-13 NOTE — Progress Notes (Signed)
Pre visit review using our clinic review tool, if applicable. No additional management support is needed unless otherwise documented below in the visit note. 

## 2013-10-13 NOTE — Progress Notes (Signed)
Subjective:    Patient ID: [name], female    DOB: January 01, 1931, 80 y.o.   MRN: [id number]  HPI Here for a rash today   It started Aug 24th approx - noticed it at a birthday party- had itching on her back  Then it got worse  Broke out all over her body  Some bumps and some blisters (some are drying up now) Itches on and off  No pain   None on face/scalp or palms or soles    Had been to [facility] that day   She worried about her mattress/ old  No new foods No change in detergent  No otc medicines  She took benadryl     She did have a rash and diarrhea/ nausea after eating a hot dog - in mid Aug Lasted 3 days  Those were hives however  ? Allergic to dye or chemicals   Patient Active Problem List   Diagnosis Date Noted  . Numbness in both hands 07/20/2013  . Cough 07/20/2013  . Heme + stool 06/02/2013  . Viral gastroenteritis 04/19/2013  . Colon cancer screening 04/19/2013  . At risk for falling 12/02/2011  . Hyperparathyroidism 11/20/2011  . Back pain, thoracic 09/30/2011  . Scoliosis 09/30/2011  . Degenerative disc disease, lumbar 09/30/2011  . Renal insufficiency 05/27/2011  . Syncope 12/05/2010  . CVA 02/26/2008  . HYPERLIPIDEMIA 05/12/2006  . HYPERTENSION 05/12/2006  . DIVERTICULOSIS, COLON 05/12/2006  . OSTEOPENIA 05/12/2006   Past Medical History  Diagnosis Date  . Hypertension   . Hyperlipidemia   . Osteopenia   . Diverticulosis   . DDD (degenerative disc disease)   . Chronic pain   . Stroke   . Syncope    Past Surgical History  Procedure Laterality Date  . Abdominal hysterectomy    . Tonsillectomy    . Exploratory laparotomy      2 times both neg   . Spine surgery      l3-l4 microdiscectomy   . Laminectomy  06-2003    l2-l4  . Doppler echocardiography      normal  . Carotid doppler      normal   History  Substance Use Topics  . Smoking status: Never Smoker   . Smokeless tobacco: Never Used  . Alcohol Use: No    Family History  Problem Relation Age of Onset  . Hypertension Father   . COPD Father   . Other Mother     Deceased, 4  . Healthy Brother    Allergies  Allergen Reactions  . Celecoxib Other (See Comments)    REACTION: affects kidneys  . Codeine Nausea Only    And dizziness  . Lisinopril     cough  . Etodolac Rash   Current Outpatient Prescriptions on File Prior to Visit  Medication Sig Dispense Refill  . Ascorbic Acid (VITAMIN C PO) Take 1 tablet by mouth.      . Cholecalciferol (VITAMIN D PO) Take 1 tablet by mouth daily.      . clopidogrel (PLAVIX) 75 MG tablet Take 75 mg by mouth daily with breakfast.      . fish oil-omega-3 fatty acids 1000 MG capsule Take 1 g by mouth daily.      Marland Kitchen gabapentin (NEURONTIN) 300 MG capsule Take 1 capsule (300 mg total) by mouth at bedtime.  30 capsule  3  . losartan (COZAAR) 50 MG tablet Take 1 tablet (50 mg total) by mouth daily.  30  tablet  11  . traMADol (ULTRAM) 50 MG tablet TAKE 1 TABLET BY MOUTH EVERY 6 HOURS AS NEEDED  30 tablet  2   No current facility-administered medications on file prior to visit.    Review of Systems    Review of Systems  Constitutional: Negative for fever, appetite change, fatigue and unexpected weight change.  Eyes: Negative for pain and visual disturbance.  ENT neg for mouth or throat swelling  Respiratory: Negative for cough and shortness of breath.   Cardiovascular: Negative for cp or palpitations    Gastrointestinal: Negative for nausea, diarrhea and constipation.  Genitourinary: Negative for urgency and frequency.  Skin: Negative for pallor and pos for rash w/itching   Neurological: Negative for weakness, light-headedness, numbness and headaches.  Hematological: Negative for adenopathy. Does not bruise/bleed easily.  Psychiatric/Behavioral: Negative for dysphoric mood. The patient is not nervous/anxious.      Objective:   Physical Exam  Constitutional: She appears well-developed and  well-nourished. No distress.  HENT:  Head: Normocephalic and atraumatic.  Mouth/Throat: Oropharynx is clear and moist.  No mouth or throat swelling   Eyes: Conjunctivae and EOM are normal. Pupils are equal, round, and reactive to light. Right eye exhibits no discharge. Left eye exhibits no discharge.  Neck: Normal range of motion. Neck supple.  Cardiovascular: Normal rate and regular rhythm.   Pulmonary/Chest: Effort normal and breath sounds normal. She has no wheezes.  Musculoskeletal: She exhibits no edema.  Lymphadenopathy:    She has no cervical adenopathy.  Neurological: She is alert.  Skin: Skin is warm and dry. Rash noted. No pallor.  Diffuse rash on trunk and extremities - small vesicles mixed with papules-pink in color  No excoriation or sign of infection   Psychiatric: She has a normal mood and affect.          Assessment & Plan:   Problem List Items Addressed This Visit     Musculoskeletal and Integument   Rash and nonspecific skin eruption - Primary     Diffuse over trunk and extremities (spares soles and palms and face and scalp) No tick bites or fever  Unsure of cause-suspect allergic  Will tx with zyrtec  Also change products to color and fragrance free Update if not starting to improve in a week or if worsening

## 2013-10-13 NOTE — Telephone Encounter (Signed)
Pt called requesting to speak to her nurse regarding her meds.  C/b [phone]

## 2013-10-13 NOTE — Telephone Encounter (Signed)
Patient called back with list of medications.  They have been corrected in her chart.

## 2013-10-13 NOTE — Assessment & Plan Note (Signed)
Diffuse over trunk and extremities (spares soles and palms and face and scalp) No tick bites or fever  Unsure of cause-suspect allergic  Will tx with zyrtec  Also change products to color and fragrance free Update if not starting to improve in a week or if worsening

## 2013-10-14 ENCOUNTER — Telehealth: Payer: Self-pay | Admitting: Neurology

## 2013-10-14 NOTE — Telephone Encounter (Signed)
Order has been sent.  They will contact patient to schedule but she wants to wait.

## 2013-10-14 NOTE — Telephone Encounter (Signed)
[phone] pt called about physical therapy she wants to hold off right now

## 2013-10-14 NOTE — Telephone Encounter (Signed)
Noted.  [name] K. Pedrohenrique Mcconville, DO   

## 2013-10-15 ENCOUNTER — Telehealth: Payer: Self-pay | Admitting: Neurology

## 2013-10-15 NOTE — Telephone Encounter (Signed)
Faxed EEG results to [facility] on 10/15/13.

## 2013-10-22 ENCOUNTER — Ambulatory Visit (INDEPENDENT_AMBULATORY_CARE_PROVIDER_SITE_OTHER): Payer: Medicare Other

## 2013-10-22 DIAGNOSIS — E538 Deficiency of other specified B group vitamins: Secondary | ICD-10-CM

## 2013-10-22 MED ORDER — CYANOCOBALAMIN 1000 MCG/ML IJ SOLN
1000.0000 ug | Freq: Once | INTRAMUSCULAR | Status: AC
  Administered 2013-10-22: 1000 ug via INTRAMUSCULAR

## 2013-10-29 ENCOUNTER — Ambulatory Visit (INDEPENDENT_AMBULATORY_CARE_PROVIDER_SITE_OTHER): Payer: Medicare Other

## 2013-10-29 DIAGNOSIS — E538 Deficiency of other specified B group vitamins: Secondary | ICD-10-CM

## 2013-10-29 MED ORDER — CYANOCOBALAMIN 1000 MCG/ML IJ SOLN
1000.0000 ug | Freq: Once | INTRAMUSCULAR | Status: AC
  Administered 2013-10-29: 1000 ug via INTRAMUSCULAR

## 2013-11-02 ENCOUNTER — Telehealth: Payer: Self-pay | Admitting: *Deleted

## 2013-11-02 ENCOUNTER — Other Ambulatory Visit: Payer: Self-pay | Admitting: *Deleted

## 2013-11-02 MED ORDER — NORTRIPTYLINE HCL 10 MG PO CAPS: 10.0000 mg | ORAL_CAPSULE | Freq: Every day | ORAL | Status: DC

## 2013-11-02 NOTE — Telephone Encounter (Signed)
Ok to call in

## 2013-11-02 NOTE — Telephone Encounter (Signed)
Patient notified Rx was sent in.

## 2013-11-02 NOTE — Telephone Encounter (Signed)
FYI patients rash is all cleared up she is ready to start the nortriptyline and need it called in to the pharmacy Call back # [phone]

## 2013-11-02 NOTE — Telephone Encounter (Signed)
Yes, pls call in nortripytyline  at bedtime, #30, 3 refills.  Zhane Donlan K. Allena Katz, DO

## 2013-11-04 ENCOUNTER — Encounter: Payer: Self-pay | Admitting: Neurology

## 2013-11-05 ENCOUNTER — Ambulatory Visit (INDEPENDENT_AMBULATORY_CARE_PROVIDER_SITE_OTHER): Payer: Medicare Other | Admitting: Family Medicine

## 2013-11-05 ENCOUNTER — Ambulatory Visit (INDEPENDENT_AMBULATORY_CARE_PROVIDER_SITE_OTHER): Payer: Medicare Other

## 2013-11-05 ENCOUNTER — Encounter: Payer: Self-pay | Admitting: Family Medicine

## 2013-11-05 VITALS — BP 190/94 | HR 88 | Temp 97.4°F

## 2013-11-05 DIAGNOSIS — I10 Essential (primary) hypertension: Secondary | ICD-10-CM

## 2013-11-05 DIAGNOSIS — Z23 Encounter for immunization: Secondary | ICD-10-CM

## 2013-11-05 DIAGNOSIS — E538 Deficiency of other specified B group vitamins: Secondary | ICD-10-CM

## 2013-11-05 LAB — BASIC METABOLIC PANEL
BUN: 12 mg/dL (ref 6–23)
CALCIUM: 10 mg/dL (ref 8.4–10.5)
CHLORIDE: 104 meq/L (ref 96–112)
CO2: 27 meq/L (ref 19–32)
CREATININE: 0.8 mg/dL (ref 0.4–1.2)
GFR: 74.12 mL/min (ref >60.00)
GLUCOSE: 99 mg/dL (ref 70–99)
Potassium: 4.9 mEq/L (ref 3.5–5.1)
Sodium: 134 mEq/L — ABNORMAL LOW (ref 135–145)

## 2013-11-05 MED ORDER — CYANOCOBALAMIN 1000 MCG/ML IJ SOLN
1000.0000 ug | Freq: Once | INTRAMUSCULAR | Status: AC
  Administered 2013-11-05: 1000 ug via INTRAMUSCULAR

## 2013-11-05 MED ORDER — LOSARTAN POTASSIUM 50 MG PO TABS: 100.0000 mg | ORAL_TABLET | Freq: Every day | ORAL | Status: DC

## 2013-11-05 NOTE — Progress Notes (Signed)
Pre visit review using our clinic review tool, if applicable. No additional management support is needed unless otherwise documented below in the visit note.  HTN.  Had TCA added on recently but no other recent meds changes.  BP elevation noted, had BLE edema- that is a recent change.  Still on ARB.  Compliant with meds.  No nsaid use.  No CP.  Not SOB.   BP ~190/90-100 at PT.  No new neuro sx.  Hasn't had more salt recently.    Meds, vitals, and allergies reviewed.   ROS: See HPI.  Otherwise, noncontributory.   GEN: nad, alert and oriented HEENT: mucous membranes moist NECK: supple w/o LA CV: rrr.  PULM: ctab, no inc wob ABD: soft, +bs EXT: 1+ BLE edema noted SKIN: no acute rash

## 2013-11-05 NOTE — Patient Instructions (Signed)
Go to the lab on the way out.  We'll contact you with your lab report. Start taking 2 of the losartan pills a day.  Recheck here at the office on Tuesday with either [place] or [employer].  Take care.  Limit salt as much as possible.

## 2013-11-07 NOTE — Assessment & Plan Note (Signed)
Unclear source for BP elevation.  Check BMET today.  Inc ARB to  a day and f/u with either me or PCP early next week.  Okay for outpatient f/u.  Routine cautions given.  She agrees. See notes on labs.

## 2013-11-09 ENCOUNTER — Encounter: Payer: Self-pay | Admitting: Family Medicine

## 2013-11-09 ENCOUNTER — Ambulatory Visit (INDEPENDENT_AMBULATORY_CARE_PROVIDER_SITE_OTHER): Payer: Medicare Other | Admitting: Family Medicine

## 2013-11-09 VITALS — BP 140/88 | HR 88 | Temp 97.8°F | Ht 64.0 in | Wt 156.5 lb

## 2013-11-09 DIAGNOSIS — I10 Essential (primary) hypertension: Secondary | ICD-10-CM

## 2013-11-09 MED ORDER — LOSARTAN POTASSIUM 100 MG PO TABS: 100.0000 mg | ORAL_TABLET | Freq: Every day | ORAL | Status: DC

## 2013-11-09 NOTE — Assessment & Plan Note (Signed)
Much improved with inc in losartan Will continue this Labs rev F/u Feb as planned Disc lifestyle/DASH diet as well

## 2013-11-09 NOTE — Progress Notes (Signed)
Pre visit review using our clinic review tool, if applicable. No additional management support is needed unless otherwise documented below in the visit note. 

## 2013-11-09 NOTE — Patient Instructions (Signed)
Your blood pressure is much better If you buy a cuff buy the brand OMRON for the arm   Continue current medicines  Take care of yourself

## 2013-11-09 NOTE — Progress Notes (Signed)
Subjective:    Patient ID: [name], female    DOB: Dec 15, 1930, 80 y.o.   MRN: [id number]  HPI Here for f/u of elevated bp   She saw Dr Para March last week  Had elevated bp at PT -and this stayed high   He increased her ARB to 100 mg (doubled up on the 50 mg)- no side effects or problems at all   Is responding well to that  bp is better   BP Readings from Last 3 Encounters:  11/09/13 134/92  11/05/13 190/94  10/13/13 142/94    She was put on pamelor 10 mg to help her sleep and for neuropathy  Thinks she is adjusting to it  Not sure if it is helping   Patient Active Problem List   Diagnosis Date Noted  . Rash and nonspecific skin eruption 10/13/2013  . Numbness in both hands 07/20/2013  . Cough 07/20/2013  . Heme + stool 06/02/2013  . Viral gastroenteritis 04/19/2013  . Colon cancer screening 04/19/2013  . At risk for falling 12/02/2011  . Hyperparathyroidism 11/20/2011  . Back pain, thoracic 09/30/2011  . Scoliosis 09/30/2011  . Degenerative disc disease, lumbar 09/30/2011  . Renal insufficiency 05/27/2011  . Syncope 12/05/2010  . CVA 02/26/2008  . HYPERLIPIDEMIA 05/12/2006  . HYPERTENSION 05/12/2006  . DIVERTICULOSIS, COLON 05/12/2006  . OSTEOPENIA 05/12/2006   Past Medical History  Diagnosis Date  . Hypertension   . Hyperlipidemia   . Osteopenia   . Diverticulosis   . DDD (degenerative disc disease)   . Chronic pain   . Stroke   . Syncope    Past Surgical History  Procedure Laterality Date  . Abdominal hysterectomy    . Tonsillectomy    . Exploratory laparotomy      2 times both neg   . Spine surgery      l3-l4 microdiscectomy   . Laminectomy  06-2003    l2-l4  . Doppler echocardiography      normal  . Carotid doppler      normal   History  Substance Use Topics  . Smoking status: Never Smoker   . Smokeless tobacco: Never Used  . Alcohol Use: No   Family History  Problem Relation Age of Onset  . Hypertension Father   . COPD Father     . Other Mother     Deceased, 32  . Healthy Brother    Allergies  Allergen Reactions  . Celecoxib Other (See Comments)    REACTION: affects kidneys  . Codeine Nausea Only    And dizziness  . Lisinopril     cough  . Etodolac Rash   Current Outpatient Prescriptions on File Prior to Visit  Medication Sig Dispense Refill  . Ascorbic Acid (VITAMIN C PO) Take 1 tablet by mouth.      . Cholecalciferol (VITAMIN D PO) Take 1 tablet by mouth daily.      . clopidogrel (PLAVIX) 75 MG tablet Take 75 mg by mouth daily with breakfast.      . cyanocobalamin (,VITAMIN B-12,) 1000 MCG/ML injection Inject 1,000 mcg into the muscle every 30 (thirty) days.      . fish oil-omega-3 fatty acids 1000 MG capsule Take 1 g by mouth daily.      Marland Kitchen gabapentin (NEURONTIN) 300 MG capsule Take 1 capsule (300 mg total) by mouth at bedtime.  30 capsule  3  . nortriptyline (PAMELOR) 10 MG capsule Take 1 capsule (10 mg total) by mouth at  bedtime.  30 capsule  3  . traMADol (ULTRAM) 50 MG tablet TAKE 1 TABLET BY MOUTH EVERY 6 HOURS AS NEEDED  30 tablet  2   No current facility-administered medications on file prior to visit.    Review of Systems Review of Systems  Constitutional: Negative for fever, appetite change, fatigue and unexpected weight change.  Eyes: Negative for pain and visual disturbance.  Respiratory: Negative for cough and shortness of breath.   Cardiovascular: Negative for cp or palpitations    Gastrointestinal: Negative for nausea, diarrhea and constipation.  Genitourinary: Negative for urgency and frequency.  Skin: Negative for pallor or rash   Neurological: Negative for weakness, light-headedness,and headaches. pos for neuropathy in hands that is bothersome  Hematological: Negative for adenopathy. Does not bruise/bleed easily.  Psychiatric/Behavioral: Negative for dysphoric mood. The patient is nervous/anxious.         Objective:   Physical Exam  Constitutional: She appears well-developed  and well-nourished. No distress.  Anxious and well appearing   HENT:  Head: Normocephalic and atraumatic.  Mouth/Throat: Oropharynx is clear and moist.  Eyes: Conjunctivae and EOM are normal. Pupils are equal, round, and reactive to light. No scleral icterus.  Neck: Normal range of motion. Neck supple. No JVD present. Carotid bruit is not present. No thyromegaly present.  Cardiovascular: Normal rate, regular rhythm, normal heart sounds and intact distal pulses.  Exam reveals no gallop.   No murmur heard. Pulmonary/Chest: Breath sounds normal. No respiratory distress. She has no wheezes. She has no rales.  Abdominal: Soft. Bowel sounds are normal. She exhibits no distension, no abdominal bruit and no mass. There is no tenderness.  Musculoskeletal: She exhibits no edema.  Lymphadenopathy:    She has no cervical adenopathy.  Neurological: She is alert. She has normal reflexes. No cranial nerve deficit. She exhibits normal muscle tone. Coordination normal.  Skin: Skin is warm and dry. No pallor.  Psychiatric: Her behavior is normal. Her mood appears anxious.          Assessment & Plan:   Problem List Items Addressed This Visit     Cardiovascular and Mediastinum   HYPERTENSION - Primary     Much improved with inc in losartan Will continue this Labs rev F/u Feb as planned Disc lifestyle/DASH diet as well       Relevant Medications      losartan (COZAAR) tablet

## 2013-11-11 ENCOUNTER — Encounter: Payer: Self-pay | Admitting: Neurology

## 2013-12-09 ENCOUNTER — Ambulatory Visit (INDEPENDENT_AMBULATORY_CARE_PROVIDER_SITE_OTHER): Payer: Medicare Other

## 2013-12-09 DIAGNOSIS — E538 Deficiency of other specified B group vitamins: Secondary | ICD-10-CM

## 2013-12-09 MED ORDER — CYANOCOBALAMIN 1000 MCG/ML IJ SOLN
1000.0000 ug | Freq: Once | INTRAMUSCULAR | Status: AC
  Administered 2013-12-09: 1000 ug via INTRAMUSCULAR

## 2013-12-12 ENCOUNTER — Encounter: Payer: Self-pay | Admitting: Neurology

## 2013-12-15 ENCOUNTER — Other Ambulatory Visit: Payer: Self-pay | Admitting: Family Medicine

## 2013-12-15 NOTE — Telephone Encounter (Signed)
Px written for call in   

## 2013-12-15 NOTE — Telephone Encounter (Signed)
Electronic refill request, please advise  

## 2013-12-15 NOTE — Telephone Encounter (Signed)
Rx called in as prescribed 

## 2014-01-11 ENCOUNTER — Ambulatory Visit (INDEPENDENT_AMBULATORY_CARE_PROVIDER_SITE_OTHER): Payer: Medicare Other

## 2014-01-11 DIAGNOSIS — E538 Deficiency of other specified B group vitamins: Secondary | ICD-10-CM

## 2014-01-11 MED ORDER — CYANOCOBALAMIN 1000 MCG/ML IJ SOLN
1000.0000 ug | Freq: Once | INTRAMUSCULAR | Status: AC
  Administered 2014-01-11: 1000 ug via INTRAMUSCULAR

## 2014-01-14 ENCOUNTER — Ambulatory Visit (INDEPENDENT_AMBULATORY_CARE_PROVIDER_SITE_OTHER): Payer: Medicare Other | Admitting: Neurology

## 2014-01-14 ENCOUNTER — Encounter: Payer: Self-pay | Admitting: Neurology

## 2014-01-14 VITALS — BP 110/76 | HR 93 | Ht 65.0 in | Wt 157.3 lb

## 2014-01-14 DIAGNOSIS — E538 Deficiency of other specified B group vitamins: Secondary | ICD-10-CM

## 2014-01-14 DIAGNOSIS — R202 Paresthesia of skin: Secondary | ICD-10-CM

## 2014-01-14 DIAGNOSIS — R278 Other lack of coordination: Secondary | ICD-10-CM

## 2014-01-14 DIAGNOSIS — M79601 Pain in right arm: Secondary | ICD-10-CM

## 2014-01-14 DIAGNOSIS — R269 Unspecified abnormalities of gait and mobility: Secondary | ICD-10-CM

## 2014-01-14 DIAGNOSIS — M79603 Pain in arm, unspecified: Secondary | ICD-10-CM

## 2014-01-14 DIAGNOSIS — M79602 Pain in left arm: Secondary | ICD-10-CM

## 2014-01-14 MED ORDER — GABAPENTIN 300 MG PO CAPS: 300.0000 mg | ORAL_CAPSULE | Freq: Two times a day (BID) | ORAL | Status: DC

## 2014-01-14 NOTE — Progress Notes (Signed)
Follow-up Visit   Date: 01/14/2014    [name] MRN: [id number] DOB: 05-06-1930   Interim History: [name] is a 80 y.o. right-handed Caucasian female with history of hypertension, stroke (2013, manifesting with dysarthria and left homonymous hemianopia, no residual symptoms), hypertension, and hyperlipidemia returning to the clinic for follow-up of hand paresthesias.  The patient was accompanied to the clinic by niece (Tammy) who also provides collateral information.    History of present illness: Around May 2015, she developed numbness and tingling of the fingertips which initially started on the left hand and then involved the right. Symptoms are worse in the morning and improves as the day goes on. She denies any exacerbating/alleviating factors. She denies any weakness of the hands or neck pain.  No numbness/tingling of the feet. She does reports difficulty with walking and loss of   UPDATE 10/12/2013:  She is here to discuss results of her EMG which was normal (bilateral upper extremities). At her last visit, I recommended she start taking Neurontin to see if this will help her symptoms, but did not notice a significant change.  On 8/24, she developed a red rash over her arms, legs, and abdomen. Lesion are pustular with clear liquid and itchy.   Patient feels it is dust mites.  She did stop her neurontin last week to see if it was medication effect.     UPDATE 01/14/2014:  She was clearing leaves from her yard and was using the leaf blower and tripped over the wire and hurt her right thumb (no fracture).  She completed physical therapy which helped her stabilize her walking.  No significant change in her paresthesias with taking gabapentin.  She is currently taking both gabapentin 300mg  and nortriptyline 10mg , but was supposed to stop gabapentin due to ?drug-related rash, but forgot to do.  No new rash.   Medications:  Current Outpatient Prescriptions on File Prior to Visit    Medication Sig Dispense Refill  . Ascorbic Acid (VITAMIN C PO) Take 1 tablet by mouth.    . Cholecalciferol (VITAMIN D PO) Take 1 tablet by mouth daily.    . clopidogrel (PLAVIX) 75 MG tablet Take 75 mg by mouth daily with breakfast.    . cyanocobalamin (,VITAMIN B-12,) 1000 MCG/ML injection Inject 1,000 mcg into the muscle every 30 (thirty) days.    . fish oil-omega-3 fatty acids 1000 MG capsule Take 1 g by mouth daily.    Marland Kitchen. losartan (COZAAR) 100 MG tablet Take 1 tablet (100 mg total) by mouth daily. 30 tablet 11  . traMADol (ULTRAM) 50 MG tablet TAKE 1 TABLET BY MOUTH EVERY 6 HOURS AS NEEDED 30 tablet 2   No current facility-administered medications on file prior to visit.    Allergies:  Allergies  Allergen Reactions  . Celecoxib Other (See Comments)    REACTION: affects kidneys  . Codeine Nausea Only    And dizziness  . Lisinopril     cough  . Etodolac Rash     Review of Systems:  CONSTITUTIONAL: No fevers, chills, night sweats, or weight loss.   EYES: No visual changes or eye pain ENT: No hearing changes.  No history of nose bleeds.   RESPIRATORY: No cough, wheezing and shortness of breath.   CARDIOVASCULAR: Negative for chest pain, and palpitations.   GI: Negative for abdominal discomfort, blood in stools or black stools.  No recent change in bowel habits.   GU:  No history of incontinence.  MUSCLOSKELETAL: No history of joint pain or swelling.  No myalgias.   SKIN: Negative for lesions,+ rash, and itching.   ENDOCRINE: Negative for cold or heat intolerance, polydipsia or goiter.   PSYCH:  No depression or anxiety symptoms.   NEURO: As Above.   Vital Signs:  BP 110/76 mmHg  Pulse 93  Ht 5\' 5"  (1.651 m)  Wt 157 lb 5 oz (71.356 kg)  BMI 26.18 kg/m2  SpO2 97%  Neurological Exam: MENTAL STATUS including orientation to time, place, person, recent and remote memory, attention span and concentration, language, and fund of knowledge is normal.  Speech is not  dysarthric.  CRANIAL NERVES:   Face is symmetric.  MOTOR:  Motor strength is 5/5 in all extremities, except bilateral ABP 5-/5.    MSRs:  Reflexes are 2+/4 in the upper extremities and 1+ in the lower extremities throughout.  SENSORY: Pin prick and vibration is absent distal to ankles bilaterally.   COORDINATION/GAIT: Gait appears ataxic and unsteady.    Data: EMG of the upper extremities 09/15/2013: This is a normal study of the right and left upper extremities. In particular, there is no evidence of generalized sensorimotor polyneuropathy, cervical radiculopathy, or carpal tunnel syndrome.  Component     Latest Ref Rng 03/17/2013 08/20/2013  TSH     0.35 - 5.50 uIU/mL 0.70   Vit D, 25-Hydroxy     30 - 89 ng/mL 45   Vitamin B-12     211 - 911 pg/mL  254  Copper     70 - 175 mcg/dL  [id number]   MRI brain wo contrast 11/17/2011:  No acute or subacute infarction.  Old inferior cerebellar infarction on the left.  Chronic small vessel changes of the pons in the cerebral hemispheric white matter, fairly typical for age.   MRA head 11/17/2011:  No major vessel occlusion.  No significant anterior circulation stenotic disease.  40% stenosis of the basilar artery in the proximal third.  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   IMPRESSION/PLAN: Paresthesias of the hands  Etiology is uncertain (? Low B12), as there is no neuropathy, CTS, or radiculopathy on EMG.   Optimize one medication so will stop nortriptyline and increase gabapentin to 300mg  BID Consider imaging the cervical spine going forward  Vitamin B12 deficiency Continue vitamin B12 1000mcg monthly - administered at PCP's office  Large fiber neuropathy affecting the feet, likely idiopathic, and contributing to sensory ataxia/gait instability  Fall precautions discussed at length and I strongly encouraged her to use a cane/walker  Encouraged her to use safety precautions and know her own  limits regarding physical activities  Continue home exercises  History of left posterior frontal and parietal infarction (2010), no residual deficits  Continue plavix and secondary risk prevention   Right cavernous carotid 4mm aneurysm Annual surveillance  Return to clinic in 4-6 months  The duration of this appointment visit was 25 minutes of face-to-face time with the patient.  Greater than 50% of this time was spent in counseling, explanation of diagnosis, planning of further management, and coordination of care.   Thank you for allowing me to participate in patient's care.  If I can answer any additional questions, I would be pleased to do so.    Sincerely,    Hadasa Gasner K. Allena KatzPatel, DO

## 2014-01-14 NOTE — Patient Instructions (Addendum)
1.  Stop nortriptyline 2.  Increase gabapentin to 600mg  (2 tablets) at bedtime 3.  Continue home exercises 4.  PLEASE USE A CANE 5.  Return to clinic in 4-6 months.

## 2014-01-18 ENCOUNTER — Other Ambulatory Visit: Payer: Self-pay | Admitting: Neurology

## 2014-01-18 MED ORDER — GABAPENTIN 300 MG PO CAPS: 300.0000 mg | ORAL_CAPSULE | Freq: Two times a day (BID) | ORAL | Status: DC

## 2014-01-18 NOTE — Telephone Encounter (Signed)
Rx sent 

## 2014-02-09 ENCOUNTER — Ambulatory Visit (INDEPENDENT_AMBULATORY_CARE_PROVIDER_SITE_OTHER): Payer: Medicare Other | Admitting: *Deleted

## 2014-02-09 DIAGNOSIS — D519 Vitamin B12 deficiency anemia, unspecified: Secondary | ICD-10-CM

## 2014-02-09 MED ORDER — CYANOCOBALAMIN 1000 MCG/ML IJ SOLN
1000.0000 ug | Freq: Once | INTRAMUSCULAR | Status: AC
  Administered 2014-02-09: 1000 ug via INTRAMUSCULAR

## 2014-02-22 ENCOUNTER — Encounter: Payer: Self-pay | Admitting: Family Medicine

## 2014-02-23 ENCOUNTER — Encounter: Payer: Self-pay | Admitting: *Deleted

## 2014-03-11 ENCOUNTER — Ambulatory Visit: Payer: Medicare Other

## 2014-03-15 ENCOUNTER — Telehealth: Payer: Self-pay | Admitting: Family Medicine

## 2014-03-15 ENCOUNTER — Ambulatory Visit (INDEPENDENT_AMBULATORY_CARE_PROVIDER_SITE_OTHER): Payer: Medicare Other | Admitting: *Deleted

## 2014-03-15 ENCOUNTER — Other Ambulatory Visit: Payer: Self-pay | Admitting: Family Medicine

## 2014-03-15 DIAGNOSIS — E785 Hyperlipidemia, unspecified: Secondary | ICD-10-CM

## 2014-03-15 DIAGNOSIS — E213 Hyperparathyroidism, unspecified: Secondary | ICD-10-CM

## 2014-03-15 DIAGNOSIS — M8580 Other specified disorders of bone density and structure, unspecified site: Secondary | ICD-10-CM

## 2014-03-15 DIAGNOSIS — E538 Deficiency of other specified B group vitamins: Secondary | ICD-10-CM | POA: Insufficient documentation

## 2014-03-15 DIAGNOSIS — D519 Vitamin B12 deficiency anemia, unspecified: Secondary | ICD-10-CM

## 2014-03-15 DIAGNOSIS — I10 Essential (primary) hypertension: Secondary | ICD-10-CM

## 2014-03-15 MED ORDER — CYANOCOBALAMIN 1000 MCG/ML IJ SOLN
1000.0000 ug | Freq: Once | INTRAMUSCULAR | Status: AC
  Administered 2014-03-15: 1000 ug via INTRAMUSCULAR

## 2014-03-15 NOTE — Telephone Encounter (Signed)
Ok to refill? Last prescribed 12/15/13. Neat appt. 03/18/14.

## 2014-03-15 NOTE — Telephone Encounter (Signed)
-----   Message from Terri J Walsh sent at 03/10/2014  4:12 PM EST ----- Regarding: Lab orders for Wednesday, 2.3.16 Patient is scheduled for CPX labs, please order future labs, Thanks , Terri  

## 2014-03-15 NOTE — Telephone Encounter (Signed)
Px written for call in   

## 2014-03-15 NOTE — Telephone Encounter (Signed)
Rx called in to [facility]. 

## 2014-03-16 ENCOUNTER — Other Ambulatory Visit (INDEPENDENT_AMBULATORY_CARE_PROVIDER_SITE_OTHER): Payer: Medicare Other

## 2014-03-16 DIAGNOSIS — E785 Hyperlipidemia, unspecified: Secondary | ICD-10-CM

## 2014-03-16 DIAGNOSIS — E213 Hyperparathyroidism, unspecified: Secondary | ICD-10-CM

## 2014-03-16 DIAGNOSIS — M8580 Other specified disorders of bone density and structure, unspecified site: Secondary | ICD-10-CM

## 2014-03-16 DIAGNOSIS — E538 Deficiency of other specified B group vitamins: Secondary | ICD-10-CM

## 2014-03-16 DIAGNOSIS — I10 Essential (primary) hypertension: Secondary | ICD-10-CM

## 2014-03-16 LAB — COMPREHENSIVE METABOLIC PANEL
ALBUMIN: 3.8 g/dL (ref 3.5–5.2)
ALT: 13 U/L (ref 0–35)
AST: 18 U/L (ref 0–37)
Alkaline Phosphatase: 93 U/L (ref 39–117)
BILIRUBIN TOTAL: 0.5 mg/dL (ref 0.2–1.2)
BUN: 14 mg/dL (ref 6–23)
CHLORIDE: 108 meq/L (ref 96–112)
CO2: 30 meq/L (ref 19–32)
Calcium: 9.8 mg/dL (ref 8.4–10.5)
Creatinine, Ser: 0.81 mg/dL (ref 0.40–1.20)
GFR: 71.95 mL/min (ref >60.00)
Glucose, Bld: 101 mg/dL — ABNORMAL HIGH (ref 70–99)
POTASSIUM: 4.3 meq/L (ref 3.5–5.1)
SODIUM: 140 meq/L (ref 135–145)
TOTAL PROTEIN: 6.3 g/dL (ref 6.0–8.3)

## 2014-03-16 LAB — CBC WITH DIFFERENTIAL/PLATELET
Basophils Absolute: 0 10*3/uL (ref 0.0–0.1)
Basophils Relative: 0.3 % (ref 0.0–3.0)
EOS ABS: 0.2 10*3/uL (ref 0.0–0.7)
Eosinophils Relative: 3.3 % (ref 0.0–5.0)
HEMATOCRIT: 36.5 % (ref 36.0–46.0)
Hemoglobin: 12.4 g/dL (ref 12.0–15.0)
LYMPHS PCT: 34.6 % (ref 12.0–46.0)
Lymphs Abs: 1.7 10*3/uL (ref 0.7–4.0)
MCHC: 34.1 g/dL (ref 30.0–36.0)
MCV: 95.8 fl (ref 78.0–100.0)
MONO ABS: 0.6 10*3/uL (ref 0.1–1.0)
MONOS PCT: 11.7 % (ref 3.0–12.0)
NEUTROS ABS: 2.4 10*3/uL (ref 1.4–7.7)
Neutrophils Relative %: 50.1 % (ref 43.0–77.0)
Platelets: 195 10*3/uL (ref 150.0–400.0)
RBC: 3.81 Mil/uL — ABNORMAL LOW (ref 3.87–5.11)
RDW: 14.6 % (ref 11.5–15.5)
WBC: 4.9 10*3/uL (ref 4.0–10.5)

## 2014-03-16 LAB — LIPID PANEL
CHOL/HDL RATIO: 2
Cholesterol: 180 mg/dL (ref 0–200)
HDL: 72.1 mg/dL (ref >39.00)
LDL Cholesterol: 83 mg/dL (ref 0–99)
NonHDL: 107.9
Triglycerides: 123 mg/dL (ref 0.0–149.0)
VLDL: 24.6 mg/dL (ref 0.0–40.0)

## 2014-03-16 LAB — VITAMIN D 25 HYDROXY (VIT D DEFICIENCY, FRACTURES): VITD: 28.07 ng/mL — AB (ref 30.00–100.00)

## 2014-03-16 LAB — VITAMIN B12: Vitamin B-12: >1500 pg/mL — ABNORMAL HIGH (ref 211–911)

## 2014-03-16 LAB — TSH: TSH: 0.96 u[IU]/mL (ref 0.35–4.50)

## 2014-03-17 LAB — PARATHYROID HORMONE, INTACT (NO CA): PTH: 97 pg/mL — ABNORMAL HIGH (ref 14–64)

## 2014-03-18 ENCOUNTER — Encounter: Payer: Self-pay | Admitting: Family Medicine

## 2014-03-18 ENCOUNTER — Ambulatory Visit (INDEPENDENT_AMBULATORY_CARE_PROVIDER_SITE_OTHER): Payer: Medicare Other | Admitting: Family Medicine

## 2014-03-18 VITALS — BP 138/92 | HR 84 | Temp 97.6°F | Ht 65.0 in | Wt 158.6 lb

## 2014-03-18 DIAGNOSIS — Z1211 Encounter for screening for malignant neoplasm of colon: Secondary | ICD-10-CM

## 2014-03-18 DIAGNOSIS — Z23 Encounter for immunization: Secondary | ICD-10-CM

## 2014-03-18 DIAGNOSIS — E538 Deficiency of other specified B group vitamins: Secondary | ICD-10-CM

## 2014-03-18 DIAGNOSIS — M8580 Other specified disorders of bone density and structure, unspecified site: Secondary | ICD-10-CM

## 2014-03-18 DIAGNOSIS — H348192 Central retinal vein occlusion, unspecified eye, stable: Secondary | ICD-10-CM | POA: Insufficient documentation

## 2014-03-18 DIAGNOSIS — E785 Hyperlipidemia, unspecified: Secondary | ICD-10-CM

## 2014-03-18 DIAGNOSIS — E213 Hyperparathyroidism, unspecified: Secondary | ICD-10-CM

## 2014-03-18 DIAGNOSIS — Z0000 Encounter for general adult medical examination without abnormal findings: Secondary | ICD-10-CM | POA: Insufficient documentation

## 2014-03-18 DIAGNOSIS — H349 Unspecified retinal vascular occlusion: Secondary | ICD-10-CM

## 2014-03-18 DIAGNOSIS — I10 Essential (primary) hypertension: Secondary | ICD-10-CM

## 2014-03-18 NOTE — Patient Instructions (Signed)
prevnar vaccine today  You are due for a tetanus shot - get that at your health dept  Add another 2000 iu of vitamin D every day (over the counter)  Change vit B12 shots to every other month Take care of yourself

## 2014-03-18 NOTE — Progress Notes (Signed)
Pre visit review using our clinic review tool, if applicable. No additional management support is needed unless otherwise documented below in the visit note. 

## 2014-03-18 NOTE — Assessment & Plan Note (Signed)
Nl IFOb within the year 4/15  No bowel changes  colonosc in 1/00- wants to avoid since she is over 77

## 2014-03-18 NOTE — Assessment & Plan Note (Signed)
bp in fair control at this time  BP Readings from Last 1 Encounters:  03/18/14 138/92   No changes needed Disc lifstyle change with low sodium diet and exercise   This is stable  Has newly found retinal vein occ- will be treated and we will monitor -(she thinks she had this when she had her last cva)

## 2014-03-18 NOTE — Assessment & Plan Note (Signed)
dexa 4/13 Disc fx risk D level is low at 28- will add another 2000 iu daily  Continue to follow  Consider dexa once she is done with opth f/u

## 2014-03-18 NOTE — Assessment & Plan Note (Signed)
PTH is up but nl ca Will work on vit D intake Continue to monitor
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
## 2014-03-18 NOTE — Assessment & Plan Note (Signed)
B12 level is high Will cut down on frequency of shots to every other month

## 2014-03-18 NOTE — Assessment & Plan Note (Signed)
Good control Disc goals for lipids and reasons to control them Rev labs with pt Rev low sat fat diet in detail

## 2014-03-18 NOTE — Assessment & Plan Note (Signed)
Reviewed health habits including diet and exercise and skin cancer prevention Reviewed appropriate screening tests for age  Also reviewed health mt list, fam hx and immunization status , as well as social and family history   See HPI Labs reviewed  prevnar vaccine today  inst to get Tdap at the health dept (less $) Disc adv directive -she will check and see if she has one -if not - will work on that (packet given)

## 2014-04-19 ENCOUNTER — Ambulatory Visit: Payer: Medicare Other

## 2014-05-24 ENCOUNTER — Ambulatory Visit: Payer: Medicare Other

## 2014-05-24 ENCOUNTER — Other Ambulatory Visit: Payer: Self-pay | Admitting: Family Medicine

## 2014-05-26 ENCOUNTER — Ambulatory Visit (INDEPENDENT_AMBULATORY_CARE_PROVIDER_SITE_OTHER): Payer: Medicare Other

## 2014-05-26 DIAGNOSIS — E538 Deficiency of other specified B group vitamins: Secondary | ICD-10-CM

## 2014-05-26 MED ORDER — CYANOCOBALAMIN 1000 MCG/ML IJ SOLN
1000.0000 ug | Freq: Once | INTRAMUSCULAR | Status: AC
  Administered 2014-05-26: 1000 ug via INTRAMUSCULAR

## 2014-06-22 ENCOUNTER — Encounter: Payer: Self-pay | Admitting: Family Medicine

## 2014-06-22 ENCOUNTER — Ambulatory Visit (INDEPENDENT_AMBULATORY_CARE_PROVIDER_SITE_OTHER): Payer: Medicare Other | Admitting: Family Medicine

## 2014-06-22 ENCOUNTER — Telehealth: Payer: Self-pay | Admitting: *Deleted

## 2014-06-22 VITALS — BP 138/90 | HR 57 | Temp 98.6°F | Ht 65.0 in | Wt 166.5 lb

## 2014-06-22 DIAGNOSIS — R600 Localized edema: Secondary | ICD-10-CM | POA: Insufficient documentation

## 2014-06-22 DIAGNOSIS — I10 Essential (primary) hypertension: Secondary | ICD-10-CM

## 2014-06-22 MED ORDER — FUROSEMIDE 20 MG PO TABS: 20.0000 mg | ORAL_TABLET | Freq: Every day | ORAL | Status: DC

## 2014-06-22 NOTE — Patient Instructions (Signed)
Try lasix (furosemide) 20 mg each am  This will make you urinate more  Drink more water Avoid sodium / processed foods  Elevate feet when you are sitting  We may need to try support hose late -let's see how you do   Follow up in 2 weeks

## 2014-06-22 NOTE — Telephone Encounter (Signed)
I called patient back and she said that she has been having some swelling in her legs.  I told her that I was not sure if it could be the neurontin and asked if she has taken a fluid pill before.  She said that she used to be on them but Dr. Milinda Antisower took her off of them about 2 years ago.  I instructed her to call Dr. Royden Purlower's office to see if maybe she needs to be back on them.  She agreed to call them.

## 2014-06-22 NOTE — Progress Notes (Signed)
Subjective:    Patient ID: [name], female    DOB: [birth year], 81 y.o.   MRN: [id number]  HPI Here with swelling in ankles/lower legs  Hard to fit in shoes and fasten sandles  Can make an indention and sock line on her leg  This makes them burn and sting  Wt is up 8 lb   Admits she needs to drink more fluid/water   No swelling in abdomen or upper extremeties  No cp or sob No PND or orthopnea    bp is up on first check  today  No cp or palpitations or headaches or edema  No side effects to medicines  BP Readings from Last 3 Encounters:  06/22/14 154/94  03/18/14 138/92  01/14/14 110/76      Had to take her off dyazide due to renal insuff - much improved after  Lab Results  Component Value Date   CREATININE 0.81 03/16/2014   BUN 14 03/16/2014   NA 140 03/16/2014   K 4.3 03/16/2014   CL 108 03/16/2014   CO2 30 03/16/2014     Patient Active Problem List   Diagnosis Date Noted  . Pedal edema 06/22/2014  . Encounter for Medicare annual wellness exam 03/18/2014  . Retinal vein occlusion 03/18/2014  . B12 deficiency 03/15/2014  . Rash and nonspecific skin eruption 10/13/2013  . Numbness in both hands 07/20/2013  . Cough 07/20/2013  . Heme + stool 06/02/2013  . Viral gastroenteritis 04/19/2013  . Colon cancer screening 04/19/2013  . At risk for falling 12/02/2011  . Hyperparathyroidism 11/20/2011  . Back pain, thoracic 09/30/2011  . Scoliosis 09/30/2011  . Degenerative disc disease, lumbar 09/30/2011  . Renal insufficiency 05/27/2011  . Syncope 12/05/2010  . CVA 02/26/2008  . Hyperlipidemia 05/12/2006  . Essential hypertension 05/12/2006  . DIVERTICULOSIS, COLON 05/12/2006  . Osteopenia 05/12/2006   Past Medical History  Diagnosis Date  . Hypertension   . Hyperlipidemia   . Osteopenia   . Diverticulosis   . DDD (degenerative disc disease)   . Chronic pain   . Stroke   . Syncope    Past Surgical History  Procedure Laterality Date  .  Abdominal hysterectomy    . Tonsillectomy    . Exploratory laparotomy      2 times both neg   . Spine surgery      l3-l4 microdiscectomy   . Laminectomy  06-2003    l2-l4  . Doppler echocardiography      normal  . Carotid doppler      normal   History  Substance Use Topics  . Smoking status: Never Smoker   . Smokeless tobacco: Never Used  . Alcohol Use: No   Family History  Problem Relation Age of Onset  . Hypertension Father   . COPD Father   . Other Mother     Deceased, 7994  . Healthy Brother    Allergies  Allergen Reactions  . Celecoxib Other (See Comments)    REACTION: affects kidneys  . Codeine Nausea Only    And dizziness  . Lisinopril     cough  . Etodolac Rash   Current Outpatient Prescriptions on File Prior to Visit  Medication Sig Dispense Refill  . Ascorbic Acid (VITAMIN C PO) Take 1 tablet by mouth.    . Cholecalciferol (VITAMIN D PO) Take 1 tablet by mouth daily.    . cyanocobalamin (,VITAMIN B-12,) 1000 MCG/ML injection Inject 1,000 mcg into the muscle once. Every  60 days    . fish oil-omega-3 fatty acids 1000 MG capsule Take 1 g by mouth daily.    Marland Kitchen. gabapentin (NEURONTIN) 300 MG capsule Take 2 capsules (600 mg total) by mouth at bedtime. 60 capsule 3  . losartan (COZAAR) 100 MG tablet Take 1 tablet (100 mg total) by mouth daily. 30 tablet 11  . traMADol (ULTRAM) 50 MG tablet TAKE 1 TABLET BY MOUTH EVERY 6 HOURS AS NEEDED 30 tablet 3   No current facility-administered medications on file prior to visit.    Review of Systems Review of Systems  Constitutional: Negative for fever, appetite change, fatigue and unexpected weight change.  Eyes: Negative for pain and visual disturbance.  Respiratory: Negative for cough and shortness of breath.   Cardiovascular: Negative for cp or palpitations   neg for PND and orthopnea, pos for pedal edema  Gastrointestinal: Negative for nausea, diarrhea and constipation.  Genitourinary: Negative for urgency and  frequency.  Skin: Negative for pallor or rash   Neurological: Negative for weakness, light-headedness, numbness and headaches.  Hematological: Negative for adenopathy. Does not bruise/bleed easily.  Psychiatric/Behavioral: Negative for dysphoric mood. The patient is not nervous/anxious.         Objective:   Physical Exam  Constitutional: She appears well-developed and well-nourished. No distress.  HENT:  Head: Normocephalic and atraumatic.  Mouth/Throat: Oropharynx is clear and moist.  Eyes: Conjunctivae and EOM are normal. Pupils are equal, round, and reactive to light.  Neck: Normal range of motion. Neck supple. No JVD present. Carotid bruit is not present. No thyromegaly present.  Cardiovascular: Normal rate, regular rhythm, normal heart sounds and intact distal pulses.  Exam reveals no gallop.   Pedal edema noted   Pulmonary/Chest: Effort normal and breath sounds normal. No respiratory distress. She has no wheezes. She has no rales.  No crackles  Abdominal: Soft. Bowel sounds are normal. She exhibits no distension, no abdominal bruit and no mass. There is no tenderness.  Musculoskeletal: She exhibits edema. She exhibits no tenderness.  1-2 plus pitting edema from knee down on today's exam  Some spider veins noted  Lymphadenopathy:    She has no cervical adenopathy.  Neurological: She is alert. She has normal reflexes.  Skin: Skin is warm and dry. No rash noted.  Psychiatric: She has a normal mood and affect.          Assessment & Plan:   Problem List Items Addressed This Visit    Essential hypertension    bp is up today Wt up 8 lb - ? If fluid retention partially  Pedal edema - starting furosemide 20 mg daily with f/u for visit/lab in 2 wk  Had to stop dyazide due to renal insuff  Given handout for DASH diet       Relevant Medications   furosemide (LASIX) 20 MG tablet   Pedal edema - Primary    Suspect multifactorial  No cardiac symptoms  Some venous  insuff Also gabapentin- cannot stop that due to chronic pain  Worse after stopping dyazide for renal insuff Px lasix 20 mg daily - f/u and lab in 2 wk  Disc limiting sodium (DASH diet) Inc water Elevate feet  May need supp hose

## 2014-06-22 NOTE — Assessment & Plan Note (Signed)
bp is up today Wt up 8 lb - ? If fluid retention partially  Pedal edema - starting furosemide 20 mg daily with f/u for visit/lab in 2 wk  Had to stop dyazide due to renal insuff  Given handout for DASH diet

## 2014-06-22 NOTE — Telephone Encounter (Signed)
Patient states she has had a lot of body swelling she is wondering if it is coming from the gabapentin  Call back number [phone]

## 2014-06-22 NOTE — Progress Notes (Signed)
Pre visit review using our clinic review tool, if applicable. No additional management support is needed unless otherwise documented below in the visit note. 

## 2014-06-22 NOTE — Assessment & Plan Note (Signed)
Suspect multifactorial  No cardiac symptoms  Some venous insuff Also gabapentin- cannot stop that due to chronic pain  Worse after stopping dyazide for renal insuff Px lasix 20 mg daily - f/u and lab in 2 wk  Disc limiting sodium (DASH diet) Inc water Elevate feet  May need supp hose

## 2014-07-16 ENCOUNTER — Other Ambulatory Visit: Payer: Self-pay | Admitting: Family Medicine

## 2014-07-18 NOTE — Telephone Encounter (Signed)
Rx called in as prescribed 

## 2014-07-18 NOTE — Telephone Encounter (Signed)
Px written for call in   

## 2014-07-18 NOTE — Telephone Encounter (Signed)
Electronic refill request, pt had CPE on 03/18/14, last refilled on 03/15/14 #30 with 3 additional refills, please advise

## 2014-07-19 ENCOUNTER — Ambulatory Visit: Payer: Medicare Other | Admitting: Neurology

## 2014-07-20 ENCOUNTER — Ambulatory Visit (INDEPENDENT_AMBULATORY_CARE_PROVIDER_SITE_OTHER): Payer: Medicare Other | Admitting: Family Medicine

## 2014-07-20 ENCOUNTER — Encounter: Payer: Self-pay | Admitting: *Deleted

## 2014-07-20 ENCOUNTER — Encounter: Payer: Self-pay | Admitting: Family Medicine

## 2014-07-20 VITALS — BP 140/80 | HR 77 | Temp 97.6°F | Ht 65.0 in | Wt 166.2 lb

## 2014-07-20 DIAGNOSIS — R600 Localized edema: Secondary | ICD-10-CM

## 2014-07-20 DIAGNOSIS — I10 Essential (primary) hypertension: Secondary | ICD-10-CM | POA: Diagnosis not present

## 2014-07-20 MED ORDER — HYDROCHLOROTHIAZIDE 25 MG PO TABS: 25.0000 mg | ORAL_TABLET | Freq: Every day | ORAL | Status: DC

## 2014-07-20 NOTE — Patient Instructions (Signed)
Stop the furosemide since it is not working (don't get rid of it , we may try a higher dose later) Start HCTZ 25 mg each am -starting today  Let's see if your kidneys tolerate it  Talk to your doctor about the gabapentin (this can cause swelling)  Drink water [facility]  Follow up with me in about 3 weeks

## 2014-07-20 NOTE — Progress Notes (Signed)
Pre visit review using our clinic review tool, if applicable. No additional management support is needed unless otherwise documented below in the visit note. 

## 2014-07-20 NOTE — Assessment & Plan Note (Signed)
Not optimal control  Add back hctz -will need to watch renal fxn with this  See if it also helps edema more than the lasix F/u 3 wk

## 2014-07-20 NOTE — Assessment & Plan Note (Signed)
This is not imp with lasix No change in exam Change to hctz (need to watch renal fxn-trouble with maxzide in the past) F/u 3 wk with labs  Also may help bp Given copy of DASH diet and disc elevation/water intake and supp hose  Will update if worse or other symptoms

## 2014-07-20 NOTE — Progress Notes (Signed)
Subjective:    Patient ID: [name], female    DOB: [birth year], 81 y.o.   MRN: [id number]  HPI Here for f/u of edema  Lasix is not helping at all  Taking 20 mg daily  Legs feel tight/sting and burn Also does not urinate more than prev   Lab Results  Component Value Date   CREATININE 0.81 03/16/2014   BUN 14 03/16/2014   NA 140 03/16/2014   K 4.3 03/16/2014   CL 108 03/16/2014   CO2 30 03/16/2014   In the past she used dyazide -and it helped more than this Stopped for renal insuff   Is on gabapentin- ? If this causes her swelling   She does drink more water  Is trying to avoid sodium   She has some hose  (? How supportive)- thigh highs  She dislikes wearing them   No sob or chest pain   Patient Active Problem List   Diagnosis Date Noted  . Pedal edema 06/22/2014  . Encounter for Medicare annual wellness exam 03/18/2014  . Retinal vein occlusion 03/18/2014  . B12 deficiency 03/15/2014  . Rash and nonspecific skin eruption 10/13/2013  . Numbness in both hands 07/20/2013  . Cough 07/20/2013  . Heme + stool 06/02/2013  . Viral gastroenteritis 04/19/2013  . Colon cancer screening 04/19/2013  . At risk for falling 12/02/2011  . Hyperparathyroidism 11/20/2011  . Back pain, thoracic 09/30/2011  . Scoliosis 09/30/2011  . Degenerative disc disease, lumbar 09/30/2011  . Renal insufficiency 05/27/2011  . Syncope 12/05/2010  . CVA 02/26/2008  . Hyperlipidemia 05/12/2006  . Essential hypertension 05/12/2006  . DIVERTICULOSIS, COLON 05/12/2006  . Osteopenia 05/12/2006   Past Medical History  Diagnosis Date  . Hypertension   . Hyperlipidemia   . Osteopenia   . Diverticulosis   . DDD (degenerative disc disease)   . Chronic pain   . Stroke   . Syncope    Past Surgical History  Procedure Laterality Date  . Abdominal hysterectomy    . Tonsillectomy    . Exploratory laparotomy      2 times both neg   . Spine surgery      l3-l4 microdiscectomy   .  Laminectomy  06-2003    l2-l4  . Doppler echocardiography      normal  . Carotid doppler      normal   History  Substance Use Topics  . Smoking status: Never Smoker   . Smokeless tobacco: Never Used  . Alcohol Use: No   Family History  Problem Relation Age of Onset  . Hypertension Father   . COPD Father   . Other Mother     Deceased, 46  . Healthy Brother    Allergies  Allergen Reactions  . Celecoxib Other (See Comments)    REACTION: affects kidneys  . Codeine Nausea Only    And dizziness  . Lisinopril     cough  . Etodolac Rash   Current Outpatient Prescriptions on File Prior to Visit  Medication Sig Dispense Refill  . Ascorbic Acid (VITAMIN C PO) Take 1 tablet by mouth.    . Cholecalciferol (VITAMIN D PO) Take 1 tablet by mouth daily.    . cyanocobalamin (,VITAMIN B-12,) 1000 MCG/ML injection Inject 1,000 mcg into the muscle once. Every 60 days    . fish oil-omega-3 fatty acids 1000 MG capsule Take 1 g by mouth daily.    . furosemide (LASIX) 20 MG tablet Take 1 tablet (20  mg total) by mouth daily. 30 tablet 11  . gabapentin (NEURONTIN) 300 MG capsule Take 2 capsules (600 mg total) by mouth at bedtime. 60 capsule 3  . losartan (COZAAR) 100 MG tablet Take 1 tablet (100 mg total) by mouth daily. 30 tablet 11  . traMADol (ULTRAM) 50 MG tablet TAKE 1 TABLET BY MOUTH EVERY 6 HOURS AS NEEDED 30 tablet 3   No current facility-administered medications on file prior to visit.        Review of Systems Review of Systems  Constitutional: Negative for fever, appetite change, fatigue and unexpected weight change.  Eyes: Negative for pain and visual disturbance.  Respiratory: Negative for cough and shortness of breath.   Cardiovascular: Negative for cp or palpitations   pos for pedal edema , neg for PND or orthopnea  Gastrointestinal: Negative for nausea, diarrhea and constipation.  Genitourinary: Negative for urgency and frequency.  Skin: Negative for pallor or rash     Neurological: Negative for weakness, light-headedness, numbness and headaches.  Hematological: Negative for adenopathy. Does not bruise/bleed easily.  Psychiatric/Behavioral: Negative for dysphoric mood. The patient is not nervous/anxious.         Objective:   Physical Exam  Constitutional: She appears well-developed and well-nourished. No distress.  HENT:  Head: Normocephalic and atraumatic.  Mouth/Throat: Oropharynx is clear and moist.  Eyes: Conjunctivae and EOM are normal. Pupils are equal, round, and reactive to light.  Neck: Normal range of motion. Neck supple. No JVD present. Carotid bruit is not present. No thyromegaly present.  Cardiovascular: Normal rate, regular rhythm, normal heart sounds and intact distal pulses.  Exam reveals no gallop.   Pulmonary/Chest: Effort normal and breath sounds normal. No respiratory distress. She has no wheezes. She has no rales.  No crackles  Abdominal: Soft. Bowel sounds are normal. She exhibits no distension, no abdominal bruit and no mass. There is no tenderness.  Musculoskeletal: She exhibits edema. She exhibits no tenderness.  Trace to 1 plus ankle edema   Lymphadenopathy:    She has no cervical adenopathy.  Neurological: She is alert. She has normal reflexes.  Skin: Skin is warm and dry. No rash noted.  Psychiatric: She has a normal mood and affect.          Assessment & Plan:   Problem List Items Addressed This Visit    Essential hypertension - Primary    Not optimal control  Add back hctz -will need to watch renal fxn with this  See if it also helps edema more than the lasix F/u 3 wk      Relevant Medications   hydrochlorothiazide (HYDRODIURIL) 25 MG tablet   Pedal edema    This is not imp with lasix No change in exam Change to hctz (need to watch renal fxn-trouble with maxzide in the past) F/u 3 wk with labs  Also may help bp Given copy of DASH diet and disc elevation/water intake and supp hose  Will update if  worse or other symptoms

## 2014-07-25 ENCOUNTER — Encounter: Payer: Self-pay | Admitting: Neurology

## 2014-07-25 ENCOUNTER — Ambulatory Visit (INDEPENDENT_AMBULATORY_CARE_PROVIDER_SITE_OTHER): Payer: Medicare Other | Admitting: Neurology

## 2014-07-25 VITALS — BP 128/84 | HR 78 | Ht 65.0 in | Wt 165.3 lb

## 2014-07-25 DIAGNOSIS — M79601 Pain in right arm: Secondary | ICD-10-CM

## 2014-07-25 DIAGNOSIS — I671 Cerebral aneurysm, nonruptured: Secondary | ICD-10-CM

## 2014-07-25 DIAGNOSIS — R202 Paresthesia of skin: Secondary | ICD-10-CM | POA: Diagnosis not present

## 2014-07-25 DIAGNOSIS — M79603 Pain in arm, unspecified: Secondary | ICD-10-CM | POA: Diagnosis not present

## 2014-07-25 DIAGNOSIS — M79602 Pain in left arm: Secondary | ICD-10-CM

## 2014-07-25 NOTE — Progress Notes (Signed)
Follow-up Visit   Date: 07/25/2014    ROSHANA Bishop MRN: [id number] DOB: [birth year]   Interim History: [name] is a 81 y.o. right-handed Caucasian female with history of hypertension, stroke (2013, manifesting with dysarthria and left homonymous hemianopia, no residual symptoms), hypertension, and hyperlipidemia returning to the clinic for follow-up of hand paresthesias.  The patient was accompanied to the clinic by niece ([name]) who also provides collateral information.    History of present illness: Around May 2015, she developed numbness and tingling of the fingertips which initially started on the left hand and then involved the right. Symptoms are worse in the morning and improves as the day goes on. She denies any exacerbating/alleviating factors. She denies any weakness of the hands or neck pain.  No numbness/tingling of the feet. She does reports difficulty with walking and loss of   UPDATE 10/12/2013:  She is here to discuss results of her EMG which was normal (bilateral upper extremities). At her last visit, I recommended she start taking Neurontin to see if this will help her symptoms, but did not notice a significant change.  On 8/24, she developed a red rash over her arms, legs, and abdomen. Lesion are pustular with clear liquid and itchy.   Patient feels it is dust mites.  She did stop her neurontin last week to see if it was medication effect.     UPDATE 01/14/2014:  She was clearing leaves from her yard and was using the leaf blower and tripped over the wire and hurt her right thumb (no fracture).  She completed physical therapy which helped her stabilize her walking.  No significant change in her paresthesias with taking gabapentin.  She is currently taking both gabapentin 300mg  and nortriptyline 10mg , but was supposed to stop gabapentin due to ?drug-related rash, but forgot to do.  No new rash.  UPDATE 07/25/2014:  She is here for 41-month return visit.  Hand  paresthesias are somewhat better than the last time.  She is most bothered by her leg swelling and feels that gabapentin may be causing this.  She is also having injections for retinopathy.  She continues to get vitamin B12 injections every other month and feels much better when she takes this.  She has not had any falls since her last visit and ambulates with a cane which supports her.    Medications:  Current Outpatient Prescriptions on File Prior to Visit  Medication Sig Dispense Refill  . Ascorbic Acid (VITAMIN C PO) Take 1 tablet by mouth.    . Cholecalciferol (VITAMIN D PO) Take 1 tablet by mouth daily.    . cyanocobalamin (,VITAMIN B-12,) 1000 MCG/ML injection Inject 1,000 mcg into the muscle once. Every 60 days    . fish oil-omega-3 fatty acids 1000 MG capsule Take 1 g by mouth daily.    Marland Kitchen gabapentin (NEURONTIN) 300 MG capsule Take 2 capsules (600 mg total) by mouth at bedtime. 60 capsule 3  . hydrochlorothiazide (HYDRODIURIL) 25 MG tablet Take 1 tablet (25 mg total) by mouth daily. 30 tablet 3  . losartan (COZAAR) 100 MG tablet Take 1 tablet (100 mg total) by mouth daily. 30 tablet 11  . traMADol (ULTRAM) 50 MG tablet TAKE 1 TABLET BY MOUTH EVERY 6 HOURS AS NEEDED 30 tablet 3   No current facility-administered medications on file prior to visit.    Allergies:  Allergies  Allergen Reactions  . Celecoxib Other (See Comments)    REACTION: affects kidneys  .  Codeine Nausea Only    And dizziness  . Lisinopril     cough  . Etodolac Rash     Review of Systems:  CONSTITUTIONAL: No fevers, chills, night sweats, or weight loss.   EYES: No visual changes or eye pain ENT: No hearing changes.  No history of nose bleeds.   RESPIRATORY: No cough, wheezing and shortness of breath.   CARDIOVASCULAR: Negative for chest pain, and palpitations.   GI: Negative for abdominal discomfort, blood in stools or black stools.  No recent change in bowel habits.   GU:  No history of incontinence.     MUSCLOSKELETAL: No history of joint pain +swelling.  No myalgias.   SKIN: Negative for lesions,+ rash, and itching.   ENDOCRINE: Negative for cold or heat intolerance, polydipsia or goiter.   PSYCH:  No depression or anxiety symptoms.   NEURO: As Above.   Vital Signs:  BP 128/84 mmHg  Pulse 78  Ht  (1.651 m)  Wt 165 lb 5 oz (74.985 kg)  BMI 27.51 kg/m2  SpO2 96%  Neurological Exam: MENTAL STATUS including orientation to time, place, person, recent and remote memory, attention span and concentration, language, and fund of knowledge is normal.  Speech is not dysarthric.  CRANIAL NERVES:   Face is symmetric.  MOTOR:  Motor strength is 5/5 in all extremities, except bilateral ABP 5-/5.  Trace bilateral ankle edema.   MSRs:  Reflexes are 2+/4 in the upper extremities and 1+ in the lower extremities throughout.  SENSORY: Pin prick and vibration is absent distal to ankles bilaterally.   COORDINATION/GAIT: Gait appears ataxic and unsteady.    Data: EMG of the upper extremities 09/15/2013: This is a normal study of the right and left upper extremities. In particular, there is no evidence of generalized sensorimotor polyneuropathy, cervical radiculopathy, or carpal tunnel syndrome.  Component     Latest Ref Rng 03/17/2013 08/20/2013  TSH     0.35 - 5.50 uIU/mL 0.70   Vit D, 25-Hydroxy     30 - 89 ng/mL 45   Vitamin B-12     211 - 911 pg/mL  254  Copper     70 - 175 mcg/dL  562   MRI brain wo contrast 11/17/2011:  No acute or subacute infarction.  Old inferior cerebellar infarction on the left.  Chronic small vessel changes of the pons in the cerebral hemispheric white matter, fairly typical for age.   MRA head 11/17/2011:  No major vessel occlusion.  No significant anterior circulation stenotic disease.  40% stenosis of the basilar artery in the proximal third.  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    IMPRESSION/PLAN: Paresthesias of the hands - improved Etiology is uncertain (? Low B12), as there is no neuropathy, CTS, or radiculopathy on EMG.   She is most bothered by her leg edema, and would like to discontinue gabapentin to see if it could be medication side effect  Vitamin B12 deficiency Continue vitamin B12 supplementation  Large fiber neuropathy affecting the feet, likely idiopathic, and contributing to sensory ataxia/gait instability  - stable No interval falls, she is using a cane  History of left posterior frontal and parietal infarction (2010), no residual deficits  Continue plavix and secondary risk prevention   Right cavernous carotid 4mm aneurysm Check MRA head for annual surveillance   Return to clinic in 1 year  The duration of this appointment visit was 25 minutes of face-to-face  time with the patient.  Greater than 50% of this time was spent in counseling, explanation of diagnosis, planning of further management, and coordination of care.   Thank you for allowing me to participate in patient's care.  If I can answer any additional questions, I would be pleased to do so.    Sincerely,    Donika K. Posey Pronto, DO

## 2014-07-25 NOTE — Patient Instructions (Signed)
1.  Stop gabapentin to see if this helps with your swelling 2.  MRA head 3.  Return to clinic in 1 year

## 2014-07-26 ENCOUNTER — Telehealth: Payer: Self-pay | Admitting: Neurology

## 2014-07-26 NOTE — Telephone Encounter (Signed)
Pt has cancelled her MRI stating she does not want to have this done / Sherri S.

## 2014-07-26 NOTE — Telephone Encounter (Signed)
I asked patient about the CTA and she does not want that done either.  Instructed her to call if she changes her mind.  Patient agreed.

## 2014-07-26 NOTE — Telephone Encounter (Signed)
[name], please inform patient that we can order CTA head instead, which will be a quicker study to simply be sure the aneurysm is stable in size.

## 2014-07-26 NOTE — Telephone Encounter (Signed)
Noted  

## 2014-07-27 ENCOUNTER — Ambulatory Visit: Payer: Medicare Other

## 2014-07-28 ENCOUNTER — Ambulatory Visit: Payer: Medicare Other

## 2014-07-29 ENCOUNTER — Ambulatory Visit (INDEPENDENT_AMBULATORY_CARE_PROVIDER_SITE_OTHER): Payer: Medicare Other | Admitting: *Deleted

## 2014-07-29 DIAGNOSIS — E538 Deficiency of other specified B group vitamins: Secondary | ICD-10-CM | POA: Diagnosis not present

## 2014-07-29 MED ORDER — CYANOCOBALAMIN 1000 MCG/ML IJ SOLN
1000.0000 ug | Freq: Once | INTRAMUSCULAR | Status: AC
  Administered 2014-07-29: 1000 ug via INTRAMUSCULAR

## 2014-08-09 ENCOUNTER — Other Ambulatory Visit: Payer: Medicare Other

## 2014-08-12 ENCOUNTER — Encounter: Payer: Self-pay | Admitting: Family Medicine

## 2014-09-21 ENCOUNTER — Encounter: Payer: Self-pay | Admitting: Primary Care

## 2014-09-21 ENCOUNTER — Ambulatory Visit (INDEPENDENT_AMBULATORY_CARE_PROVIDER_SITE_OTHER): Payer: Medicare Other | Admitting: Primary Care

## 2014-09-21 VITALS — BP 128/80 | HR 103 | Temp 97.5°F | Ht 65.0 in | Wt 165.8 lb

## 2014-09-21 DIAGNOSIS — R000 Tachycardia, unspecified: Secondary | ICD-10-CM

## 2014-09-21 DIAGNOSIS — F418 Other specified anxiety disorders: Secondary | ICD-10-CM

## 2014-09-21 DIAGNOSIS — F32A Depression, unspecified: Secondary | ICD-10-CM

## 2014-09-21 DIAGNOSIS — F329 Major depressive disorder, single episode, unspecified: Secondary | ICD-10-CM | POA: Insufficient documentation

## 2014-09-21 DIAGNOSIS — F419 Anxiety disorder, unspecified: Principal | ICD-10-CM

## 2014-09-21 MED ORDER — SERTRALINE HCL 25 MG PO TABS: 25.0000 mg | ORAL_TABLET | Freq: Every day | ORAL | Status: DC

## 2014-09-21 NOTE — Progress Notes (Signed)
Pre visit review using our clinic review tool, if applicable. No additional management support is needed unless otherwise documented below in the visit note. 

## 2014-09-21 NOTE — Patient Instructions (Addendum)
Start Sertraline (Zoloft) tablets for anxiety and depression. Take 1 tablet by mouth daily.  If you have trouble sleeping, try taking Melatonin. This may be purchased over the counter.  Complete lab work prior to leaving today. I will notify you of your results.  Follow up in 4 weeks for re-evaluation.  It was a pleasure meeting you!

## 2014-09-21 NOTE — Assessment & Plan Note (Signed)
Meets criteria for depression and GAD. Sadness, anxiety, inability to sleep. Denies SI/HI Will start low dose Zoloft with close follow up. She was instructed on how to take the medication and the possible side effects. She is to follow up in 4 weeks.

## 2014-09-21 NOTE — Progress Notes (Signed)
Subjective:    Patient ID: [name], female    DOB: February 18, 1930, 81 y.o.   MRN: [id number]  HPI  [name] is a 81 year old female who presents today with a chief complaint of difficulty sleeping and depression. She has difficulty initiating sleep. When she tries to lay down she feels jittery, worries, and her mind races. After she falls asleep at night she will wake back up after several hours. She's been feeling this way for the past several weeks but has become worse of the past week. She has tried some "sleeping pills" over the counter without help. She admits to daytime tiredness, feeling depressed, has little interested in doing things, and frustrated as she cannot do the home tasks she once did. She denies SI/HI. Overall she does not feel herself.  Review of Systems  Constitutional: Negative for fever and chills.  Respiratory: Negative for shortness of breath.   Cardiovascular: Negative for chest pain.  Neurological: Negative for dizziness and headaches.  Psychiatric/Behavioral: Positive for sleep disturbance. Negative for suicidal ideas. The patient is nervous/anxious.        Past Medical History  Diagnosis Date  . Hypertension   . Hyperlipidemia   . Osteopenia   . Diverticulosis   . DDD (degenerative disc disease)   . Chronic pain   . Stroke   . Syncope     Social History   Social History  . Marital Status: Single    Spouse Name: N/A  . Number of Children: N/A  . Years of Education: N/A   Occupational History  . retired    Social History Main Topics  . Smoking status: Never Smoker   . Smokeless tobacco: Never Used  . Alcohol Use: No  . Drug Use: No  . Sexual Activity: Not on file   Other Topics Concern  . Not on file   Social History Narrative   She lives alone in a [place] mobile home.   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 Abdominal hysterectomy    . Tonsillectomy    . Exploratory laparotomy      2 times both neg   . Spine surgery      l3-l4 microdiscectomy   . Laminectomy  06-2003    l2-l4  . Doppler echocardiography      normal  . Carotid doppler      normal    Family History  Problem Relation Age of Onset  . Hypertension Father   . COPD Father   . Other Mother     Deceased, 69  . Healthy Brother     Allergies  Allergen Reactions  . Celecoxib Other (See Comments)    REACTION: affects kidneys  . Codeine Nausea Only    And dizziness  . Lisinopril     cough  . Etodolac Rash    Current Outpatient Prescriptions on File Prior to Visit  Medication Sig Dispense Refill  . Ascorbic Acid (VITAMIN C PO) Take 1 tablet by mouth.    . Cholecalciferol (VITAMIN D PO) Take 1 tablet by mouth daily.    . fish oil-omega-3 fatty acids 1000 MG capsule Take 1 g by mouth daily.    Marland Kitchen gabapentin (NEURONTIN) 300 MG capsule Take 2 capsules (600 mg total) by mouth at bedtime. 60 capsule 3  . hydrochlorothiazide (HYDRODIURIL) 25 MG  tablet Take 1 tablet (25 mg total) by mouth daily. 30 tablet 3  . losartan (COZAAR) 100 MG tablet Take 1 tablet (100 mg total) by mouth daily. 30 tablet 11  . traMADol (ULTRAM) 50 MG tablet TAKE 1 TABLET BY MOUTH EVERY 6 HOURS AS NEEDED 30 tablet 3   No current facility-administered medications on file prior to visit.    BP 128/80 mmHg  Pulse 103  Temp(Src) 97.5 F (36.4 C) (Oral)  Ht 5\' 5"  (1.651 m)  Wt 165 lb 12.8 oz (75.206 kg)  BMI 27.59 kg/m2  SpO2 97%    Objective:   Physical Exam  Constitutional: She is oriented to person, place, and time. She appears well-nourished.  Cardiovascular: Normal rate and regular rhythm.   Slightly tachycardic during exam. Sinus rhythm  Pulmonary/Chest: Effort normal and breath sounds normal.  Neurological: She is alert and oriented to person, place, and time.  Skin: Skin is warm and dry.  Psychiatric: She has a  normal mood and affect.          Assessment & Plan:  Mild tachycardia in clinic. Will do BMP and CBC to ensure this is not metabolic as she has no documented history of anxiety and depression.

## 2014-09-22 LAB — BASIC METABOLIC PANEL
BUN: 22 mg/dL (ref 6–23)
CALCIUM: 10.5 mg/dL (ref 8.4–10.5)
CO2: 27 mEq/L (ref 19–32)
Chloride: 101 mEq/L (ref 96–112)
Creatinine, Ser: 1 mg/dL (ref 0.40–1.20)
GFR: 56.34 mL/min — ABNORMAL LOW (ref >60.00)
GLUCOSE: 98 mg/dL (ref 70–99)
Potassium: 3.5 mEq/L (ref 3.5–5.1)
SODIUM: 138 meq/L (ref 135–145)

## 2014-09-22 LAB — CBC
HEMATOCRIT: 39.4 % (ref 36.0–46.0)
HEMOGLOBIN: 13.4 g/dL (ref 12.0–15.0)
MCHC: 33.9 g/dL (ref 30.0–36.0)
MCV: 97.3 fl (ref 78.0–100.0)
Platelets: 230 10*3/uL (ref 150.0–400.0)
RBC: 4.05 Mil/uL (ref 3.87–5.11)
RDW: 13.7 % (ref 11.5–15.5)
WBC: 7.6 10*3/uL (ref 4.0–10.5)

## 2014-09-27 ENCOUNTER — Encounter: Payer: Self-pay | Admitting: Primary Care

## 2014-09-27 ENCOUNTER — Ambulatory Visit (INDEPENDENT_AMBULATORY_CARE_PROVIDER_SITE_OTHER): Payer: Medicare Other | Admitting: Primary Care

## 2014-09-27 VITALS — BP 126/70 | HR 94 | Temp 97.5°F | Ht 65.0 in | Wt 162.8 lb

## 2014-09-27 DIAGNOSIS — E538 Deficiency of other specified B group vitamins: Secondary | ICD-10-CM

## 2014-09-27 DIAGNOSIS — F32A Depression, unspecified: Secondary | ICD-10-CM

## 2014-09-27 DIAGNOSIS — F329 Major depressive disorder, single episode, unspecified: Secondary | ICD-10-CM

## 2014-09-27 DIAGNOSIS — F418 Other specified anxiety disorders: Secondary | ICD-10-CM

## 2014-09-27 DIAGNOSIS — F419 Anxiety disorder, unspecified: Principal | ICD-10-CM

## 2014-09-27 MED ORDER — ALPRAZOLAM 0.25 MG PO TABS: 0.2500 mg | ORAL_TABLET | Freq: Every evening | ORAL | Status: DC | PRN

## 2014-09-27 MED ORDER — CYANOCOBALAMIN 1000 MCG/ML IJ SOLN
1000.0000 ug | Freq: Once | INTRAMUSCULAR | Status: AC
  Administered 2014-09-27: 1000 ug via INTRAMUSCULAR

## 2014-09-27 NOTE — Progress Notes (Signed)
Subjective:    Patient ID: [name], female    DOB: [birth year], 81 y.o.   MRN: [id number]  HPI  [name] is a 81 year old female who presents today with a chief complaint of nauesa and feeling "jittery". Her symptoms have been present since 09/21/2014. She was evaluated on 09/21/2014 for symptoms of difficulty sleeping and depression and was initiated on Zoloft 25 mg tablets last visit.   During her last visit she reports nausea, upset stomach, and continued "jitteryness". She has not been able to eat much due to feeling anxious and inability to sleep at night. She's been taking her Zoloft on an empty stomach.  Her niece is present during the exam and reports her aunt is nervous throughout the day as she lives alone. She believes she gets overwhelmed with tending to her house by herself.  The patient endorses daily worry and will get nervous regarding her responsibilities at home. Denies vomiting, chest pain, shortness of breath.  Wt Readings from Last 3 Encounters:  09/27/14 162 lb 12.8 oz (73.846 kg)  09/21/14 165 lb 12.8 oz (75.206 kg)  07/25/14 165 lb 5 oz (74.985 kg)       Review of Systems  Constitutional: Negative for fever and chills.  Respiratory: Negative for shortness of breath.   Cardiovascular: Negative for chest pain.  Gastrointestinal: Positive for nausea. Negative for vomiting and abdominal pain.  Psychiatric/Behavioral: Positive for sleep disturbance. Negative for suicidal ideas. The patient is nervous/anxious.        Past Medical History  Diagnosis Date  . Hypertension   . Hyperlipidemia   . Osteopenia   . Diverticulosis   . DDD (degenerative disc disease)   . Chronic pain   . Stroke   . Syncope     Social History   Social History  . Marital Status: Single    Spouse Name: N/A  . Number of Children: N/A  . Years of Education: N/A   Occupational History  . retired    Social History Main Topics  . Smoking status: Never Smoker   . Smokeless  tobacco: Never Used  . Alcohol Use: No  . Drug Use: No  . Sexual Activity: Not on file   Other Topics Concern  . Not on file   Social History Narrative   She lives alone in a [place] mobile home.   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 Abdominal hysterectomy    . Tonsillectomy    . Exploratory laparotomy      2 times both neg   . Spine surgery      l3-l4 microdiscectomy   . Laminectomy  06-2003    l2-l4  . Doppler echocardiography      normal  . Carotid doppler      normal    Family History  Problem Relation Age of Onset  . Hypertension Father   . COPD Father   . Other Mother     Deceased, 26  . Healthy Brother     Allergies  Allergen Reactions  . Celecoxib Other (See Comments)    REACTION: affects kidneys  . Codeine Nausea Only    And dizziness  . Lisinopril     cough  . Etodolac Rash    Current Outpatient Prescriptions on File Prior to Visit  Medication Sig Dispense Refill  .  Ascorbic Acid (VITAMIN C PO) Take 1 tablet by mouth.    . Cholecalciferol (VITAMIN D PO) Take 1 tablet by mouth daily.    . fish oil-omega-3 fatty acids 1000 MG capsule Take 1 g by mouth daily.    Marland Kitchen gabapentin (NEURONTIN) 300 MG capsule Take 2 capsules (600 mg total) by mouth at bedtime. 60 capsule 3  . hydrochlorothiazide (HYDRODIURIL) 25 MG tablet Take 1 tablet (25 mg total) by mouth daily. 30 tablet 3  . losartan (COZAAR) 100 MG tablet Take 1 tablet (100 mg total) by mouth daily. 30 tablet 11  . sertraline (ZOLOFT) 25 MG tablet Take 1 tablet (25 mg total) by mouth daily. 30 tablet 1  . traMADol (ULTRAM) 50 MG tablet TAKE 1 TABLET BY MOUTH EVERY 6 HOURS AS NEEDED 30 tablet 3   No current facility-administered medications on file prior to visit.    BP 126/70 mmHg  Pulse 94  Temp(Src) 97.5 F (36.4 C) (Oral)  Ht  (1.651 m)  Wt 162 lb 12.8 oz  (73.846 kg)  BMI 27.09 kg/m2  SpO2 97%    Objective:   Physical Exam  Constitutional: She is oriented to person, place, and time. She appears well-nourished.  Cardiovascular: Normal rate and regular rhythm.   Pulmonary/Chest: Effort normal and breath sounds normal.  Neurological: She is alert and oriented to person, place, and time.  Skin: Skin is warm and dry.  Psychiatric: She has a normal mood and affect.  Seems anxious          Assessment & Plan:

## 2014-09-27 NOTE — Progress Notes (Signed)
Pre visit review using our clinic review tool, if applicable. No additional management support is needed unless otherwise documented below in the visit note. 

## 2014-09-27 NOTE — Patient Instructions (Addendum)
Continue to take the Sertraline (Zoloft) for anxiety and depression. Make sure you take this medication with food.  Start Alprazolam (Xanax) to help with sleep. Take 1 tablet at bedtime as needed for sleep.  Please notify me if you continue to feel nauseated.  I will see you in September as scheduled.  It was a pleasure to see you today!

## 2014-09-27 NOTE — Assessment & Plan Note (Addendum)
Nausea and continued anxiety since starting Zoloft 25 mg 1 week ago. She's been taking this on an empty stomach. She continues to be anxious regarding inability to sleep at night. Discussed that the Zoloft will take several weeks to work. Also discussed to take Zoloft with food. Will give short term RX for xanax to help bridge medication. Discussed safety risks including risk for falls. Follow up as scheduled in September.

## 2014-09-28 ENCOUNTER — Ambulatory Visit: Payer: Medicare Other

## 2014-10-19 ENCOUNTER — Ambulatory Visit: Payer: Medicare Other | Admitting: Primary Care

## 2014-10-25 ENCOUNTER — Encounter: Payer: Self-pay | Admitting: Primary Care

## 2014-10-25 ENCOUNTER — Ambulatory Visit (INDEPENDENT_AMBULATORY_CARE_PROVIDER_SITE_OTHER): Payer: Medicare Other | Admitting: Primary Care

## 2014-10-25 VITALS — BP 126/80 | HR 90 | Temp 97.8°F | Ht 65.0 in | Wt 163.4 lb

## 2014-10-25 DIAGNOSIS — F419 Anxiety disorder, unspecified: Principal | ICD-10-CM

## 2014-10-25 DIAGNOSIS — F418 Other specified anxiety disorders: Secondary | ICD-10-CM

## 2014-10-25 DIAGNOSIS — F32A Depression, unspecified: Secondary | ICD-10-CM

## 2014-10-25 DIAGNOSIS — F329 Major depressive disorder, single episode, unspecified: Secondary | ICD-10-CM

## 2014-10-25 MED ORDER — ALPRAZOLAM 0.25 MG PO TABS: 0.2500 mg | ORAL_TABLET | Freq: Every evening | ORAL | Status: DC | PRN

## 2014-10-25 NOTE — Patient Instructions (Signed)
1. Resume Sertraline (Zoloft) for anxiety and depression. This medication is to be taken once every morning.  2. Try taking Melatonin at bedtime to help with sleep.  3. If you feel anxious at bedtime and cannot sleep despite taking the melatonin, you may then take 1 of the alprazolam tablets.  4. Follow up with Dr. Milinda Antis in 6 weeks for evaluation of anxiety and depression.  It was a pleasure to see you today!

## 2014-10-25 NOTE — Assessment & Plan Note (Addendum)
Sleep improved with Xanax, has also been taking Melatonin as needed. She became confused regarding her Zoloft and stopped taking it last visit. Advised she resume her Zoloft and take Melatonin at bedtime for sleep. She is to use the xanax PRN if she feels anxious and cannot sleep despite melatonin. Instructions written out step-by-step. She verbalized understanding. She is to follow up with PCP in 6 weeks for re-evaluation.

## 2014-10-25 NOTE — Progress Notes (Signed)
Subjective:    Patient ID: [name], female    DOB: 09/07/1930, 81 y.o.   MRN: [id number]  HPI  [name] is an 81 year old female who presents today for follow up depression and anxiety. She was initially evaluated on 09/21/14 for difficulty sleeping and depression. She lives at home by herself and felt anxious about taking care of her home and being alone. She met criteria for GAD and was initiated on low dose Zoloft. She returned to the clinic on 09/27/14 with complaints of feeling jittery and nausea. She was taking her Zoloft on an empty stomach. She was advised to take with food and was also prescribed a short term dose of Xanax to help with sleep until medication could take full effect.   Since her last visit she has not been taking her Zoloft. She was confused and thought she was to stop taking it. She's been taking the Xanax at bedtime for sleep with improvement. She continues to feel anxious throughout the day intermittently.   Review of Systems  Respiratory: Negative for shortness of breath.   Cardiovascular: Negative for chest pain.  Gastrointestinal: Negative for nausea.  Psychiatric/Behavioral: Negative for suicidal ideas and sleep disturbance. The patient is nervous/anxious.        Past Medical History  Diagnosis Date  . Hypertension   . Hyperlipidemia   . Osteopenia   . Diverticulosis   . DDD (degenerative disc disease)   . Chronic pain   . Stroke   . Syncope     Social History   Social History  . Marital Status: Single    Spouse Name: N/A  . Number of Children: N/A  . Years of Education: N/A   Occupational History  . retired    Social History Main Topics  . Smoking status: Never Smoker   . Smokeless tobacco: Never Used  . Alcohol Use: No  . Drug Use: No  . Sexual Activity: Not on file   Other Topics Concern  . Not on file   Social History Narrative   She lives alone in a [place] mobile home.   She worked as a Associate Professor  at [employer] level of education:  12th grade    Past Surgical History  Procedure Laterality Date  . Abdominal hysterectomy    . Tonsillectomy    . Exploratory laparotomy      2 times both neg   . Spine surgery      l3-l4 microdiscectomy   . Laminectomy  06-2003    l2-l4  . Doppler echocardiography      normal  . Carotid doppler      normal    Family History  Problem Relation Age of Onset  . Hypertension Father   . COPD Father   . Other Mother     Deceased, 45  . Healthy Brother     Allergies  Allergen Reactions  . Celecoxib Other (See Comments)    REACTION: affects kidneys  . Codeine Nausea Only    And dizziness  . Lisinopril     cough  . Etodolac Rash    Current Outpatient Prescriptions on File Prior to Visit  Medication Sig Dispense Refill  . Ascorbic Acid (VITAMIN C PO) Take 1 tablet by mouth.    . Cholecalciferol (VITAMIN D PO) Take 1 tablet by mouth daily.    . fish oil-omega-3 fatty acids 1000 MG capsule Take 1 g by mouth daily.    Marland Kitchen  gabapentin (NEURONTIN) 300 MG capsule Take 2 capsules (600 mg total) by mouth at bedtime. 60 capsule 3  . hydrochlorothiazide (HYDRODIURIL) 25 MG tablet Take 1 tablet (25 mg total) by mouth daily. 30 tablet 3  . losartan (COZAAR) 100 MG tablet Take 1 tablet (100 mg total) by mouth daily. 30 tablet 11  . sertraline (ZOLOFT) 25 MG tablet Take 1 tablet (25 mg total) by mouth daily. 30 tablet 1  . traMADol (ULTRAM) 50 MG tablet TAKE 1 TABLET BY MOUTH EVERY 6 HOURS AS NEEDED 30 tablet 3  . [DISCONTINUED] clopidogrel (PLAVIX) 75 MG tablet Take 75 mg by mouth daily.  3   No current facility-administered medications on file prior to visit.    BP 126/80 mmHg  Pulse 90  Temp(Src) 97.8 F (36.6 C) (Oral)  Ht $R'5\' 5"'mx$  (1.651 m)  Wt 163 lb 6.4 oz (74.118 kg)  BMI 27.19 kg/m2  SpO2 98%    Objective:   Physical Exam  Constitutional: She is oriented to person, place, and time. She appears well-nourished.    Cardiovascular: Normal rate and regular rhythm.   Pulmonary/Chest: Effort normal and breath sounds normal.  Neurological: She is alert and oriented to person, place, and time.  Skin: Skin is warm and dry.  Psychiatric: She has a normal mood and affect.          Assessment & Plan:

## 2014-10-25 NOTE — Progress Notes (Signed)
Pre visit review using our clinic review tool, if applicable. No additional management support is needed unless otherwise documented below in the visit note. 

## 2014-10-31 ENCOUNTER — Other Ambulatory Visit: Payer: Self-pay | Admitting: Family Medicine

## 2014-11-08 ENCOUNTER — Other Ambulatory Visit: Payer: Self-pay | Admitting: Family Medicine

## 2014-11-13 ENCOUNTER — Encounter (HOSPITAL_COMMUNITY): Payer: Self-pay | Admitting: Emergency Medicine

## 2014-11-13 ENCOUNTER — Emergency Department (HOSPITAL_COMMUNITY): Payer: Medicare Other

## 2014-11-13 ENCOUNTER — Emergency Department (HOSPITAL_COMMUNITY)
Admission: EM | Admit: 2014-11-13 | Discharge: 2014-11-14 | Disposition: A | Discharge status: Home / Self Care | Payer: Medicare Other | Attending: Emergency Medicine | Admitting: Emergency Medicine

## 2014-11-13 DIAGNOSIS — E785 Hyperlipidemia, unspecified: Secondary | ICD-10-CM | POA: Insufficient documentation

## 2014-11-13 DIAGNOSIS — I10 Essential (primary) hypertension: Secondary | ICD-10-CM | POA: Diagnosis not present

## 2014-11-13 DIAGNOSIS — Z8719 Personal history of other diseases of the digestive system: Secondary | ICD-10-CM | POA: Diagnosis not present

## 2014-11-13 DIAGNOSIS — Z79899 Other long term (current) drug therapy: Secondary | ICD-10-CM | POA: Insufficient documentation

## 2014-11-13 DIAGNOSIS — Z90710 Acquired absence of both cervix and uterus: Secondary | ICD-10-CM | POA: Diagnosis not present

## 2014-11-13 DIAGNOSIS — Z8673 Personal history of transient ischemic attack (TIA), and cerebral infarction without residual deficits: Secondary | ICD-10-CM | POA: Insufficient documentation

## 2014-11-13 DIAGNOSIS — Z8739 Personal history of other diseases of the musculoskeletal system and connective tissue: Secondary | ICD-10-CM | POA: Diagnosis not present

## 2014-11-13 DIAGNOSIS — N200 Calculus of kidney: Secondary | ICD-10-CM | POA: Insufficient documentation

## 2014-11-13 DIAGNOSIS — G8929 Other chronic pain: Secondary | ICD-10-CM | POA: Diagnosis not present

## 2014-11-13 DIAGNOSIS — R109 Unspecified abdominal pain: Secondary | ICD-10-CM | POA: Diagnosis present

## 2014-11-13 LAB — URINALYSIS, ROUTINE W REFLEX MICROSCOPIC
Bilirubin Urine: NEGATIVE
Glucose, UA: NEGATIVE mg/dL
HGB URINE DIPSTICK: NEGATIVE
Ketones, ur: NEGATIVE mg/dL
NITRITE: NEGATIVE
Protein, ur: NEGATIVE mg/dL
SPECIFIC GRAVITY, URINE: 1.022 (ref 1.005–1.030)
UROBILINOGEN UA: 0.2 mg/dL (ref 0.0–1.0)
pH: 5.5 (ref 5.0–8.0)

## 2014-11-13 LAB — COMPREHENSIVE METABOLIC PANEL
ALBUMIN: 3.8 g/dL (ref 3.5–5.0)
ALT: 22 U/L (ref 14–54)
ANION GAP: 12 (ref 5–15)
AST: 30 U/L (ref 15–41)
Alkaline Phosphatase: 90 U/L (ref 38–126)
BILIRUBIN TOTAL: 0.4 mg/dL (ref 0.3–1.2)
BUN: 23 mg/dL — ABNORMAL HIGH (ref 6–20)
CO2: 24 mmol/L (ref 22–32)
Calcium: 10 mg/dL (ref 8.9–10.3)
Chloride: 98 mmol/L — ABNORMAL LOW (ref 101–111)
Creatinine, Ser: 1.18 mg/dL — ABNORMAL HIGH (ref 0.44–1.00)
GFR calc Af Amer: 48 mL/min — ABNORMAL LOW (ref >60)
GFR calc non Af Amer: 42 mL/min — ABNORMAL LOW (ref >60)
GLUCOSE: 144 mg/dL — AB (ref 65–99)
POTASSIUM: 3.5 mmol/L (ref 3.5–5.1)
SODIUM: 134 mmol/L — AB (ref 135–145)
TOTAL PROTEIN: 6.9 g/dL (ref 6.5–8.1)

## 2014-11-13 LAB — CBC
HEMATOCRIT: 38 % (ref 36.0–46.0)
HEMOGLOBIN: 13 g/dL (ref 12.0–15.0)
MCH: 32.8 pg (ref 26.0–34.0)
MCHC: 34.2 g/dL (ref 30.0–36.0)
MCV: 96 fL (ref 78.0–100.0)
Platelets: 212 10*3/uL (ref 150–400)
RBC: 3.96 MIL/uL (ref 3.87–5.11)
RDW: 13 % (ref 11.5–15.5)
WBC: 7.4 10*3/uL (ref 4.0–10.5)

## 2014-11-13 LAB — URINE MICROSCOPIC-ADD ON

## 2014-11-13 LAB — I-STAT CG4 LACTIC ACID, ED: LACTIC ACID, VENOUS: 2.24 mmol/L — AB (ref 0.5–2.0)

## 2014-11-13 LAB — LIPASE, BLOOD: LIPASE: 14 U/L — AB (ref 22–51)

## 2014-11-13 MED ORDER — FENTANYL CITRATE (PF) 100 MCG/2ML IJ SOLN
100.0000 ug | Freq: Once | INTRAMUSCULAR | Status: AC
  Administered 2014-11-13: 100 ug via INTRAVENOUS
  Filled 2014-11-13: qty 2

## 2014-11-13 MED ORDER — IOHEXOL 300 MG/ML  SOLN
25.0000 mL | Freq: Once | INTRAMUSCULAR | Status: AC | PRN
  Administered 2014-11-13: 25 mL via ORAL

## 2014-11-13 MED ORDER — KETOROLAC TROMETHAMINE 15 MG/ML IJ SOLN
15.0000 mg | Freq: Once | INTRAMUSCULAR | Status: DC
Start: 2014-11-13 — End: 2014-11-14

## 2014-11-13 MED ORDER — ONDANSETRON HCL 4 MG/2ML IJ SOLN
4.0000 mg | Freq: Once | INTRAMUSCULAR | Status: AC
Start: 2014-11-13 — End: 2014-11-13
  Administered 2014-11-13: 4 mg via INTRAVENOUS
  Filled 2014-11-13: qty 2

## 2014-11-13 MED ORDER — MORPHINE SULFATE (PF) 4 MG/ML IV SOLN
4.0000 mg | Freq: Once | INTRAVENOUS | Status: AC
  Administered 2014-11-13: 4 mg via INTRAVENOUS
  Filled 2014-11-13: qty 1

## 2014-11-13 MED ORDER — IOHEXOL 300 MG/ML  SOLN
100.0000 mL | Freq: Once | INTRAMUSCULAR | Status: AC | PRN
  Administered 2014-11-13: 80 mL via INTRAVENOUS

## 2014-11-13 MED ORDER — SODIUM CHLORIDE 0.9 % IV BOLUS (SEPSIS)
1000.0000 mL | Freq: Once | INTRAVENOUS | Status: AC
  Administered 2014-11-13: 1000 mL via INTRAVENOUS

## 2014-11-13 NOTE — ED Provider Notes (Signed)
CSN: [id number]     Arrival date & time 11/13/14  2025 History   First MD Initiated Contact with Patient 11/13/14 2103     Chief Complaint  Patient presents with  . Abdominal Pain     (Consider location/radiation/quality/duration/timing/severity/associated sxs/prior Treatment) HPI Comments: Patient is an 81 year old female with a past medical history of hypertension and diverticulosis who presents with abdominal pain that started 2 hours prior to arrival. The pain is located in the right abdomen and does not radiate. The pain is described as aching and severe. The pain started gradually and progressively worsened since the onset. No alleviating/aggravating factors. The patient has tried nothing for symptoms without relief. Associated symptoms include nausea. Patient denies fever, headache, vomiting, diarrhea, constipation.    Past Medical History  Diagnosis Date  . Hypertension   . Hyperlipidemia   . Osteopenia   . Diverticulosis   . DDD (degenerative disc disease)   . Chronic pain   . Stroke (HCC)   . Syncope    Past Surgical History  Procedure Laterality Date  . Abdominal hysterectomy    . Tonsillectomy    . Exploratory laparotomy      2 times both neg   . Spine surgery      l3-l4 microdiscectomy   . Laminectomy  06-2003    l2-l4  . Doppler echocardiography      normal  . Carotid doppler      normal   Family History  Problem Relation Age of Onset  . Hypertension Father   . COPD Father   . Other Mother     Deceased, 80  . Healthy Brother    Social History  Substance Use Topics  . Smoking status: Never Smoker   . Smokeless tobacco: Never Used  . Alcohol Use: No   OB History    No data available     Review of Systems  Gastrointestinal: Positive for nausea and abdominal pain.  All other systems reviewed and are negative.     Allergies  Celecoxib; Codeine; Lisinopril; and Etodolac  Home Medications   Prior to Admission medications   Medication Sig  Start Date End Date Taking? Authorizing Provider  ALPRAZolam (XANAX) 0.25 MG tablet Take 1 tablet (0.25 mg total) by mouth at bedtime as needed for anxiety or sleep. 10/25/14   Doreene Nest, NP  Ascorbic Acid (VITAMIN C PO) Take 1 tablet by mouth.    Historical Provider, MD  Cholecalciferol (VITAMIN D PO) Take 1 tablet by mouth daily.    Historical Provider, MD  fish oil-omega-3 fatty acids 1000 MG capsule Take 1 g by mouth daily.    Historical Provider, MD  gabapentin (NEURONTIN) 300 MG capsule Take 2 capsules (600 mg total) by mouth at bedtime. 01/18/14   Donika K Patel, DO  hydrochlorothiazide (HYDRODIURIL) 25 MG tablet TAKE 1 TABLET (25 MG TOTAL) BY MOUTH DAILY. 11/08/14   Judy Pimple, MD  losartan (COZAAR) 100 MG tablet TAKE 1 TABLET (100 MG TOTAL) BY MOUTH DAILY. 10/31/14   Judy Pimple, MD  sertraline (ZOLOFT) 25 MG tablet Take 1 tablet (25 mg total) by mouth daily. 09/21/14   Doreene Nest, NP  traMADol (ULTRAM) 50 MG tablet TAKE 1 TABLET BY MOUTH EVERY 6 HOURS AS NEEDED 07/18/14   Judy Pimple, MD   BP 185/110 mmHg  Pulse 100  Temp(Src) 97.9 F (36.6 C) (Oral)  Resp 20  Ht  (1.702 m)  Wt 165 lb 14.4  oz (75.252 kg)  BMI 25.98 kg/m2  SpO2 98% Physical Exam  Constitutional: She is oriented to person, place, and time. She appears well-developed and well-nourished. No distress.  HENT:  Head: Normocephalic and atraumatic.  Eyes: Conjunctivae and EOM are normal.  Neck: Normal range of motion.  Cardiovascular: Normal rate and regular rhythm.  Exam reveals no gallop and no friction rub.   No murmur heard. Pulmonary/Chest: Effort normal and breath sounds normal. She has no wheezes. She has no rales. She exhibits no tenderness.  Abdominal: Soft. She exhibits no distension. There is tenderness. There is no rebound.  Right mid abdominal tenderness to palpation. No focal tenderness or peritoneal signs.   Musculoskeletal: Normal range of motion.  Neurological: She is alert and  oriented to person, place, and time. Coordination normal.  Speech is goal-oriented. Moves limbs without ataxia.   Skin: Skin is warm and dry.  Psychiatric: She has a normal mood and affect. Her behavior is normal.  Nursing note and vitals reviewed.   ED Course  Procedures (including critical care time) Labs Review Labs Reviewed  LIPASE, BLOOD - Abnormal; Notable for the following:    Lipase 14 (*)    All other components within normal limits  COMPREHENSIVE METABOLIC PANEL - Abnormal; Notable for the following:    Sodium 134 (*)    Chloride 98 (*)    Glucose, Bld 144 (*)    BUN 23 (*)    Creatinine, Ser 1.18 (*)    GFR calc non Af Amer 42 (*)    GFR calc Af Amer 48 (*)    All other components within normal limits  URINALYSIS, ROUTINE W REFLEX MICROSCOPIC (NOT AT [facility]) - Abnormal; Notable for the following:    APPearance CLOUDY (*)    Leukocytes, UA LARGE (*)    All other components within normal limits  URINE MICROSCOPIC-ADD ON - Abnormal; Notable for the following:    Squamous Epithelial / LPF FEW (*)    Bacteria, UA MANY (*)    Casts HYALINE CASTS (*)    All other components within normal limits  I-STAT CG4 LACTIC ACID, ED - Abnormal; Notable for the following:    Lactic Acid, Venous 2.24 (*)    All other components within normal limits  URINE CULTURE  CBC  I-STAT CG4 LACTIC ACID, ED    Imaging Review Ct Abdomen Pelvis W Contrast  11/13/2014   CLINICAL DATA:  Acute onset of right lower quadrant abdominal pain, nausea and vomiting. Initial encounter.  EXAM: CT ABDOMEN AND PELVIS WITH CONTRAST  TECHNIQUE: Multidetector CT imaging of the abdomen and pelvis was performed using the standard protocol following bolus administration of intravenous contrast.  CONTRAST:  80mL OMNIPAQUE IOHEXOL 300 MG/ML  SOLN  COMPARISON:  CT of the abdomen and pelvis from 04/14/2013  FINDINGS: Mild bibasilar scarring and atelectasis are noted.  The liver and spleen are unremarkable in appearance.  The gallbladder is within normal limits. The pancreas and adrenal glands are unremarkable.  There is mild right-sided hydronephrosis, with an obstructing large 7 x 5 mm stone noted in the proximal right ureter, approximately 6 cm below the right renal pelvis. Associated right-sided perinephric stranding is noted.  No nonobstructing renal stones are identified. Small bilateral renal cysts are seen. 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 The left kidney is otherwise unremarkable in appearance.  No free fluid is  identified. The small bowel is unremarkable in appearance. The stomach is within normal limits. No acute vascular abnormalities are seen. Mild scattered calcification is noted along the abdominal aorta and its branches.  The appendix is normal in caliber and contains air, without evidence of appendicitis. Scattered diverticulosis is noted along the distal transverse, descending and proximal sigmoid colon, without evidence of diverticulitis.  The bladder is mildly distended and grossly unremarkable. The patient is status post hysterectomy. No suspicious adnexal masses are seen. The ovaries are grossly unremarkable in appearance. No inguinal lymphadenopathy is seen.  No acute osseous abnormalities are identified. Multilevel vacuum phenomenon is noted along the lumbar spine, with associated endplate sclerotic change and underlying decompression.  IMPRESSION: 1. Mild right-sided hydronephrosis, with an obstructing large 7 x 5 mm stone in the proximal right ureter, 6 cm below the right renal pelvis. 2. Small bilateral renal cysts noted. 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 3. Mild scattered calcification along the abdominal aorta and its branches. 4. Scattered diverticulosis along the distal transverse, descending and proximal sigmoid colon, without evidence of  diverticulitis. 5. Diffuse degenerative change along the lumbar spine.   Electronically Signed   By: Roanna Raider M.D.   On: 11/13/2014 23:35   I have personally reviewed and evaluated these images and lab results as part of my medical decision-making.   EKG Interpretation None      MDM   Final diagnoses:  Kidney stone on right side    9:17 PM Labs and urinalysis pending. Vitals stable and patient afebrile.   12:03 AM I spoke with Dr. Berneice Heinrich who recommends patient discharged with close follow up and strict return precautions. Patient's pain in controlled at this time. Labs show no leukocytosis or other acute changes. Patient is mild tachycardic and afebrile at this time.    Emilia Beck, PA-C 11/14/14 4540  Pricilla Loveless, MD 11/16/14 [phone]

## 2014-11-13 NOTE — ED Notes (Addendum)
R lower abd pain x 2 hours that radiates to R side with nausea.  Denies urinary symptoms.

## 2014-11-14 LAB — I-STAT CG4 LACTIC ACID, ED: LACTIC ACID, VENOUS: 1.15 mmol/L (ref 0.5–2.0)

## 2014-11-14 MED ORDER — ONDANSETRON 4 MG PO TBDP: 4.0000 mg | ORAL_TABLET | Freq: Three times a day (TID) | ORAL | Status: DC | PRN

## 2014-11-14 MED ORDER — OXYCODONE-ACETAMINOPHEN 5-325 MG PO TABS: 1.0000 | ORAL_TABLET | ORAL | Status: DC | PRN

## 2014-11-14 NOTE — Discharge Instructions (Signed)
Take Percocet as needed for pain. Take zofran as needed for nausea. Refer to attached documents for more information. Follow up with [facility] for further evaluation and management. Return to the ED with worsening or concerning symptoms such as fever or uncontrollable pain.

## 2014-11-15 LAB — URINE CULTURE

## 2014-11-23 ENCOUNTER — Other Ambulatory Visit: Payer: Self-pay | Admitting: Family Medicine

## 2014-11-23 NOTE — Telephone Encounter (Signed)
Px written for call in   

## 2014-11-23 NOTE — Telephone Encounter (Signed)
Electronic refill request, pt has f/u scheduled on 12/06/14, last refilled on 07/18/14 #30 with 3 additional refills, please advise

## 2014-11-24 NOTE — Telephone Encounter (Signed)
Rx called in as prescribed 

## 2014-12-06 ENCOUNTER — Ambulatory Visit (INDEPENDENT_AMBULATORY_CARE_PROVIDER_SITE_OTHER): Payer: Medicare Other | Admitting: Family Medicine

## 2014-12-06 ENCOUNTER — Encounter: Payer: Self-pay | Admitting: Family Medicine

## 2014-12-06 VITALS — BP 140/80 | HR 89 | Temp 97.4°F | Ht 65.0 in | Wt 164.5 lb

## 2014-12-06 DIAGNOSIS — E538 Deficiency of other specified B group vitamins: Secondary | ICD-10-CM | POA: Diagnosis not present

## 2014-12-06 DIAGNOSIS — F329 Major depressive disorder, single episode, unspecified: Secondary | ICD-10-CM

## 2014-12-06 DIAGNOSIS — H349 Unspecified retinal vascular occlusion: Secondary | ICD-10-CM

## 2014-12-06 DIAGNOSIS — H348192 Central retinal vein occlusion, unspecified eye, stable: Secondary | ICD-10-CM

## 2014-12-06 DIAGNOSIS — F419 Anxiety disorder, unspecified: Principal | ICD-10-CM

## 2014-12-06 DIAGNOSIS — Z23 Encounter for immunization: Secondary | ICD-10-CM | POA: Diagnosis not present

## 2014-12-06 DIAGNOSIS — F32A Depression, unspecified: Secondary | ICD-10-CM

## 2014-12-06 DIAGNOSIS — F418 Other specified anxiety disorders: Secondary | ICD-10-CM | POA: Diagnosis not present

## 2014-12-06 MED ORDER — CYANOCOBALAMIN 1000 MCG/ML IJ SOLN
1000.0000 ug | Freq: Once | INTRAMUSCULAR | Status: AC
  Administered 2014-12-06: 1000 ug via INTRAMUSCULAR

## 2014-12-06 NOTE — Patient Instructions (Signed)
I'm glad you mood is improved Continue the sertraline (zoloft) for anxiety  I took the xanax off your list because you are not taking it  I also removed the oxycodone and nausea medicine (zofran) because you are not taking it   Flu shot today

## 2014-12-06 NOTE — Assessment & Plan Note (Signed)
B12 shot today (every 2 mo) Pt thinks this helps her energy and thinking

## 2014-12-06 NOTE — Assessment & Plan Note (Signed)
Pt is taking sertraline (after some confusion) Doing better-no further panic attacks  Pt still seems to confuse easily- she blames that on recent kidney stone and pain medication for that - but has stopped it now  Rev meds in detail  Reviewed stressors/ coping techniques/symptoms/ support sources/ tx options and side effects in detail today She retains a good sense of humor Will continue to follow closely for this   Took xanax off her list since gabapentin helps her sleep (taking 300 now instead of 600 as well)

## 2014-12-06 NOTE — Progress Notes (Signed)
Subjective:    Patient ID: [name], female    DOB: 1930/04/30, 81 y.o.   MRN: [id number]  HPI Here for f/u of mood disorder   [name] has seen her several times for this Was started on Zoloft and xanax   She does not know what happened - unsure if she is taking these  Pretty sure that she is not taking the xanax  Unsure if she is taking zoloft     When it started she had an anxiety attack  Is a lot better now  No more anxiety attacks  Not overtly depressed- she has down days-then she distracts herself and keeps busy  Not enough exercise  Thinks she could get out more (is a challenge with physical problems)   Losing some  vision in her R eye  She does drive unless she has to have eyes dilated  Hx of retinal vein occlusion   Lives by herself  Brother and his wife live up the road and there are neighbors close by - but they do not socialize   Goes out to eat Goes to church  Sees cousin sometimes (stays with her some days)   She drives only during the day in familiar places No interstate Has not become lost     Does not get help with her medicines  Get confused easily about them   She has had a lot going on lately   Was on zofran and oxycodone for kidney stone      Had a kidney stone -has f/u for that next week Also starting to get a cold    Patient Active Problem List   Diagnosis Date Noted  . Anxiety and depression 09/21/2014  . Pedal edema 06/22/2014  . Encounter for Medicare annual wellness exam 03/18/2014  . Retinal vein occlusion 03/18/2014  . B12 deficiency 03/15/2014  . Rash and nonspecific skin eruption 10/13/2013  . Numbness in both hands 07/20/2013  . Cough 07/20/2013  . Heme + stool 06/02/2013  . Viral gastroenteritis 04/19/2013  . Colon cancer screening 04/19/2013  . At risk for falling 12/02/2011  . Hyperparathyroidism (HCC) 11/20/2011  . Back pain, thoracic 09/30/2011  . Scoliosis 09/30/2011  . Degenerative disc disease,  lumbar 09/30/2011  . Renal insufficiency 05/27/2011  . Syncope 12/05/2010  . CVA 02/26/2008  . Hyperlipidemia 05/12/2006  . Essential hypertension 05/12/2006  . DIVERTICULOSIS, COLON 05/12/2006  . Osteopenia 05/12/2006   Past Medical History  Diagnosis Date  . Hypertension   . Hyperlipidemia   . Osteopenia   . Diverticulosis   . DDD (degenerative disc disease)   . Chronic pain   . Stroke (HCC)   . Syncope    Past Surgical History  Procedure Laterality Date  . Abdominal hysterectomy    . Tonsillectomy    . Exploratory laparotomy      2 times both neg   . Spine surgery      l3-l4 microdiscectomy   . Laminectomy  06-2003    l2-l4  . Doppler echocardiography      normal  . Carotid doppler      normal   Social History  Substance Use Topics  . Smoking status: Never Smoker   . Smokeless tobacco: Never Used  . Alcohol Use: No   Family History  Problem Relation Age of Onset  . Hypertension Father   . COPD Father   . Other Mother     Deceased, 61  . Healthy Brother  Allergies  Allergen Reactions  . Celecoxib Other (See Comments)    affects kidneys  . Codeine Nausea Only and Other (See Comments)    dizziness  . Lisinopril Cough  . Etodolac Rash   Current Outpatient Prescriptions on File Prior to Visit  Medication Sig Dispense Refill  . Ascorbic Acid (VITAMIN C PO) Take 1 tablet by mouth.    . Cholecalciferol (VITAMIN D PO) Take 1 tablet by mouth daily.    . Cyanocobalamin (VITAMIN B-12 IJ) Inject as directed See admin instructions. Inject every 2 months - last injection August 2016    . dorzolamide (TRUSOPT) 2 % ophthalmic solution Place 1 drop into the right eye 2 (two) times daily.  3  . fish oil-omega-3 fatty acids 1000 MG capsule Take 1 g by mouth daily.    Marland Kitchen gabapentin (NEURONTIN) 300 MG capsule Take 2 capsules (600 mg total) by mouth at bedtime. (Patient taking differently: Take 300 mg by mouth at bedtime. ) 60 capsule 3  . hydrochlorothiazide  (HYDRODIURIL) 25 MG tablet TAKE 1 TABLET (25 MG TOTAL) BY MOUTH DAILY. 30 tablet 5  . losartan (COZAAR) 100 MG tablet TAKE 1 TABLET (100 MG TOTAL) BY MOUTH DAILY. 30 tablet 1  . sertraline (ZOLOFT) 25 MG tablet Take 1 tablet (25 mg total) by mouth daily. 30 tablet 1  . timolol (TIMOPTIC) 0.5 % ophthalmic solution Place 1 drop into the right eye 2 (two) times daily.  3  . traMADol (ULTRAM) 50 MG tablet TAKE 1 TABLET BY MOUTH EVERY 6 HOURS AS NEEDED 30 tablet 3  . [DISCONTINUED] clopidogrel (PLAVIX) 75 MG tablet Take 75 mg by mouth daily.  3   No current facility-administered medications on file prior to visit.    Review of Systems Review of Systems  Constitutional: Negative for fever, appetite change,  and unexpected weight change.  ENT pos for nasal cong and rhinorrhea (thinks she is getting a cold), neg for ST or facial pain  Eyes: Negative for pain and visual disturbance.  Respiratory: Negative for cough and shortness of breath.   Cardiovascular: Negative for cp or palpitations    Gastrointestinal: Negative for nausea, diarrhea and constipation.  Genitourinary: Negative for urgency and frequency. pos for recent kidney stone with resolved pain now  Skin: Negative for pallor or rash   Neurological: Negative for weakness, light-headedness, numbness and headaches.  Hematological: Negative for adenopathy. Does not bruise/bleed easily.  Psychiatric/Behavioral: Negative for dysphoric mood. The patient is nervous/anxious (but improved) pos for stressors        Objective:   Physical Exam  Constitutional: She appears well-developed and well-nourished. No distress.  HENT:  Head: Normocephalic and atraumatic.  Mouth/Throat: Oropharynx is clear and moist.  Nares are injected and congested  Clear rhinorrhea   TMs clear with scant cerumen  Poor hearing   Eyes: Conjunctivae and EOM are normal. Pupils are equal, round, and reactive to light.  Neck: Normal range of motion. Neck supple.    Cardiovascular: Normal rate and regular rhythm.   Pulmonary/Chest: Effort normal and breath sounds normal.  Musculoskeletal: She exhibits no edema.  Lymphadenopathy:    She has no cervical adenopathy.  Neurological: She is alert. She has normal reflexes. No cranial nerve deficit. She exhibits normal muscle tone. Coordination normal.  Skin: Skin is warm and dry. No rash noted. No erythema. No pallor.  Psychiatric: Her behavior is normal. Thought content normal. Her mood appears anxious. Her affect is not blunt, not labile and not inappropriate. Her  speech is tangential. Thought content is not paranoid. She does not exhibit a depressed mood. She expresses no homicidal and no suicidal ideation.  Tangential in speech  Mildly anxious - expresses improvement however  Freely disc stressors Seems to confuse easily-esp when disc her medications            Assessment & Plan:   Problem List Items Addressed This Visit      Cardiovascular and Mediastinum   Retinal vein occlusion     Digestive   B12 deficiency    B12 shot today (every 2 mo) Pt thinks this helps her energy and thinking        Relevant Medications   cyanocobalamin ((VITAMIN B-12)) injection 1,000 mcg (Completed)     Other   Anxiety and depression - Primary    Pt is taking sertraline (after some confusion) Doing better-no further panic attacks  Pt still seems to confuse easily- she blames that on recent kidney stone and pain medication for that - but has stopped it now  Rev meds in detail  Reviewed stressors/ coping techniques/symptoms/ support sources/ tx options and side effects in detail today She retains a good sense of humor Will continue to follow closely for this   Took xanax off her list since gabapentin helps her sleep (taking 300 now instead of 600 as well)       Other Visit Diagnoses    Need for influenza vaccination        Relevant Orders    Flu Vaccine QUAD 36+ mos PF IM (Fluarix & Fluzone Quad PF)  (Completed)

## 2014-12-06 NOTE — Progress Notes (Signed)
Pre visit review using our clinic review tool, if applicable. No additional management support is needed unless otherwise documented below in the visit note. 

## 2014-12-20 ENCOUNTER — Other Ambulatory Visit: Payer: Self-pay | Admitting: Family Medicine

## 2014-12-20 NOTE — Telephone Encounter (Signed)
I think it was d/c - but check with pt to be sure Thanks

## 2014-12-20 NOTE — Telephone Encounter (Signed)
Electronic refill request, not on med list and med said it was d/c at Dr. Eliane DecreePatel's OV on 07/15/14, please advise

## 2014-12-21 NOTE — Telephone Encounter (Signed)
Left voicemail requesting pt to call office back 

## 2014-12-22 NOTE — Telephone Encounter (Signed)
Go ahead and refill it for 6 months  Thanks

## 2014-12-22 NOTE — Telephone Encounter (Signed)
done

## 2014-12-22 NOTE — Telephone Encounter (Signed)
Pt said she has been taking it at 1st she though you told her to restart the med (no note of that in chart), then when I told her I don't see a record of you telling her to restart med she said it must have been Dr. Chestine Sporelark that told her to restart it, please advise if it's okay to refill

## 2014-12-26 ENCOUNTER — Other Ambulatory Visit: Payer: Self-pay | Admitting: Family Medicine

## 2015-01-05 ENCOUNTER — Other Ambulatory Visit: Payer: Self-pay | Admitting: Primary Care

## 2015-01-21 ENCOUNTER — Other Ambulatory Visit: Payer: Self-pay | Admitting: Neurology

## 2015-01-23 ENCOUNTER — Other Ambulatory Visit: Payer: Self-pay | Admitting: *Deleted

## 2015-01-23 NOTE — Telephone Encounter (Signed)
Called patient to see if she is still taking this.  She said that she didn't think she was taking this right now.

## 2015-02-06 ENCOUNTER — Other Ambulatory Visit: Payer: Self-pay | Admitting: Family Medicine

## 2015-02-08 ENCOUNTER — Other Ambulatory Visit: Payer: Self-pay | Admitting: Family Medicine

## 2015-02-09 MED ORDER — TRAMADOL HCL 50 MG PO TABS: 50.0000 mg | ORAL_TABLET | Freq: Four times a day (QID) | ORAL | Status: DC | PRN

## 2015-02-09 NOTE — Telephone Encounter (Signed)
Pt request refill request last f/u was 12/06/14 and last refilled 11/23/2014 #30 with 3 refills

## 2015-02-09 NOTE — Telephone Encounter (Signed)
rx called in as prescribed/tvw

## 2015-02-09 NOTE — Telephone Encounter (Signed)
Px written for call in   I inc bottle amt to 60 based on how often she refills it

## 2015-03-21 DIAGNOSIS — H348112 Central retinal vein occlusion, right eye, stable: Secondary | ICD-10-CM | POA: Diagnosis not present

## 2015-03-21 DIAGNOSIS — H211X1 Other vascular disorders of iris and ciliary body, right eye: Secondary | ICD-10-CM | POA: Diagnosis not present

## 2015-03-21 DIAGNOSIS — H4051X2 Glaucoma secondary to other eye disorders, right eye, moderate stage: Secondary | ICD-10-CM | POA: Diagnosis not present

## 2015-05-15 ENCOUNTER — Other Ambulatory Visit: Payer: Self-pay | Admitting: Family Medicine

## 2015-06-13 ENCOUNTER — Ambulatory Visit (INDEPENDENT_AMBULATORY_CARE_PROVIDER_SITE_OTHER): Payer: Medicare Other | Admitting: Internal Medicine

## 2015-06-13 ENCOUNTER — Encounter: Payer: Self-pay | Admitting: Internal Medicine

## 2015-06-13 ENCOUNTER — Other Ambulatory Visit: Payer: Self-pay | Admitting: Family Medicine

## 2015-06-13 VITALS — BP 128/84 | HR 101 | Temp 98.2°F | Wt 157.8 lb

## 2015-06-13 DIAGNOSIS — L259 Unspecified contact dermatitis, unspecified cause: Secondary | ICD-10-CM | POA: Diagnosis not present

## 2015-06-13 DIAGNOSIS — N390 Urinary tract infection, site not specified: Secondary | ICD-10-CM | POA: Diagnosis not present

## 2015-06-13 DIAGNOSIS — N3289 Other specified disorders of bladder: Secondary | ICD-10-CM

## 2015-06-13 DIAGNOSIS — R3989 Other symptoms and signs involving the genitourinary system: Secondary | ICD-10-CM

## 2015-06-13 LAB — POC URINALSYSI DIPSTICK (AUTOMATED)
BILIRUBIN UA: NEGATIVE
GLUCOSE UA: NEGATIVE
KETONES UA: NEGATIVE
Nitrite, UA: NEGATIVE
PH UA: 6.5
PROTEIN UA: NEGATIVE
Spec Grav, UA: 1.02
Urobilinogen, UA: NEGATIVE

## 2015-06-13 MED ORDER — CIPROFLOXACIN HCL 250 MG PO TABS: 250.0000 mg | ORAL_TABLET | Freq: Two times a day (BID) | ORAL | Status: DC

## 2015-06-13 MED ORDER — TRIAMCINOLONE ACETONIDE 0.5 % EX OINT: 1.0000 "application " | TOPICAL_OINTMENT | Freq: Two times a day (BID) | CUTANEOUS | Status: DC

## 2015-06-13 NOTE — Progress Notes (Signed)
Pre visit review using our clinic review tool, if applicable. No additional management support is needed unless otherwise documented below in the visit note. 

## 2015-06-13 NOTE — Patient Instructions (Signed)
Contact Dermatitis Dermatitis is redness, soreness, and swelling (inflammation) of the skin. Contact dermatitis is a reaction to certain substances that touch the skin. There are two types of contact dermatitis:   Irritant contact dermatitis. This type is caused by something that irritates your skin, such as dry hands from washing them too much. This type does not require previous exposure to the substance for a reaction to occur. This type is more common.  Allergic contact dermatitis. This type is caused by a substance that you are allergic to, such as a nickel allergy or poison ivy. This type only occurs if you have been exposed to the substance (allergen) before. Upon a repeat exposure, your body reacts to the substance. This type is less common. CAUSES  Many different substances can cause contact dermatitis. Irritant contact dermatitis is most commonly caused by exposure to:   Makeup.   Soaps.   Detergents.   Bleaches.   Acids.   Metal salts, such as nickel.  Allergic contact dermatitis is most commonly caused by exposure to:   Poisonous plants.   Chemicals.   Jewelry.   Latex.   Medicines.   Preservatives in products, such as clothing.  RISK FACTORS This condition is more likely to develop in:   People who have jobs that expose them to irritants or allergens.  People who have certain medical conditions, such as asthma or eczema.  SYMPTOMS  Symptoms of this condition may occur anywhere on your body where the irritant has touched you or is touched by you. Symptoms include:  Dryness or flaking.   Redness.   Cracks.   Itching.   Pain or a burning feeling.   Blisters.  Drainage of small amounts of blood or clear fluid from skin cracks. With allergic contact dermatitis, there may also be swelling in areas such as the eyelids, mouth, or genitals.  DIAGNOSIS  This condition is diagnosed with a medical history and physical exam. A patch skin test  may be performed to help determine the cause. If the condition is related to your job, you may need to see an occupational medicine specialist. TREATMENT Treatment for this condition includes figuring out what caused the reaction and protecting your skin from further contact. Treatment may also include:   Steroid creams or ointments. Oral steroid medicines may be needed in more severe cases.  Antibiotics or antibacterial ointments, if a skin infection is present.  Antihistamine lotion or an antihistamine taken by mouth to ease itching.  A bandage (dressing). HOME CARE INSTRUCTIONS Skin Care  Moisturize your skin as needed.   Apply cool compresses to the affected areas.  Try taking a bath with:  Epsom salts. Follow the instructions on the packaging. You can get these at your local pharmacy or grocery store.  Baking soda. Pour a small amount into the bath as directed by your health care provider.  Colloidal oatmeal. Follow the instructions on the packaging. You can get this at your local pharmacy or grocery store.  Try applying baking soda paste to your skin. Stir water into baking soda until it reaches a paste-like consistency.  Do not scratch your skin.  Bathe less frequently, such as every other day.  Bathe in lukewarm water. Avoid using hot water. Medicines  Take or apply over-the-counter and prescription medicines only as told by your health care provider.   If you were prescribed an antibiotic medicine, take or apply your antibiotic as told by your health care provider. Do not stop using the   antibiotic even if your condition starts to improve. General Instructions  Keep all follow-up visits as told by your health care provider. This is important.  Avoid the substance that caused your reaction. If you do not know what caused it, keep a journal to try to track what caused it. Write down:  What you eat.  What cosmetic products you use.  What you drink.  What  you wear in the affected area. This includes jewelry.  If you were given a dressing, take care of it as told by your health care provider. This includes when to change and remove it. SEEK MEDICAL CARE IF:   Your condition does not improve with treatment.  Your condition gets worse.  You have signs of infection such as swelling, tenderness, redness, soreness, or warmth in the affected area.  You have a fever.  You have new symptoms. SEEK IMMEDIATE MEDICAL CARE IF:   You have a severe headache, neck pain, or neck stiffness.  You vomit.  You feel very sleepy.  You notice red streaks coming from the affected area.  Your bone or joint underneath the affected area becomes painful after the skin has healed.  The affected area turns darker.  You have difficulty breathing.   This information is not intended to replace advice given to you by your health care provider. Make sure you discuss any questions you have with your health care provider.   Document Released: 01/26/2000 Document Revised: 10/19/2014 Document Reviewed: 06/15/2014 Elsevier Interactive Patient Education 2016 [employer]  

## 2015-06-13 NOTE — Progress Notes (Signed)
Subjective:    Patient ID: [name], female    DOB: [birth year], 82 y.o.   MRN: [id number]  HPI  Pt presents to the clinic today with c/o a rash near her right elbow. She noticed this 2 days ago. She reports she thought that she had 2 ticks attached to her arm, so she put alcohol on her arm to make the ticks fall off. Since then, the area has continued to itch and she noticed the rash has spread down her arm. She denies fever, chills, joint pain, muscle pain or headaches. She reports the rash does not burn and is not painful. She has been working out in the yard recently.  Additionally, she c/o lower abdominal pain. This started 2 days ago. She describes the pain as fullness. She denies diarrhea or constipation. She denies increased frequency, odor or discharge. She reports the pain is worse when she moves around, and she has some pain with urination. She has not taken anything OTC for this.  Review of Systems  Past Medical History  Diagnosis Date  . Hypertension   . Hyperlipidemia   . Osteopenia   . Diverticulosis   . DDD (degenerative disc disease)   . Chronic pain   . Stroke (HCC)   . Syncope     Current Outpatient Prescriptions  Medication Sig Dispense Refill  . Ascorbic Acid (VITAMIN C PO) Take 1 tablet by mouth.    . Cholecalciferol (VITAMIN D PO) Take 1 tablet by mouth daily.    . clopidogrel (PLAVIX) 75 MG tablet TAKE 1 TABLET BY MOUTH EVERY DAY 90 tablet 0  . Cyanocobalamin (VITAMIN B-12 IJ) Inject as directed See admin instructions. Inject every 2 months - last injection August 2016    . dorzolamide (TRUSOPT) 2 % ophthalmic solution Place 1 drop into the right eye 2 (two) times daily.  3  . fish oil-omega-3 fatty acids 1000 MG capsule Take 1 g by mouth daily.    Marland Kitchen gabapentin (NEURONTIN) 300 MG capsule Take 2 capsules (600 mg total) by mouth at bedtime. (Patient taking differently: Take 300 mg by mouth at bedtime. ) 60 capsule 3  . hydrochlorothiazide (HYDRODIURIL) 25  MG tablet TAKE 1 TABLET (25 MG TOTAL) BY MOUTH DAILY 30 tablet 2  . losartan (COZAAR) 100 MG tablet TAKE 1 TABLET (100 MG TOTAL) BY MOUTH DAILY. 30 tablet 10  . sertraline (ZOLOFT) 25 MG tablet TAKE 1 TABLET BY MOUTH DAILY 30 tablet 2  . timolol (TIMOPTIC) 0.5 % ophthalmic solution Place 1 drop into the right eye 2 (two) times daily.  3  . traMADol (ULTRAM) 50 MG tablet Take 1 tablet (50 mg total) by mouth every 6 (six) hours as needed. 60 tablet 3   No current facility-administered medications for this visit.    Allergies  Allergen Reactions  . Celecoxib Other (See Comments)    affects kidneys  . Codeine Nausea Only and Other (See Comments)    dizziness  . Lisinopril Cough  . Etodolac Rash    Family History  Problem Relation Age of Onset  . Hypertension Father   . COPD Father   . Other Mother     Deceased, 102  . Healthy Brother     Social History   Social History  . Marital Status: Single    Spouse Name: N/A  . Number of Children: N/A  . Years of Education: N/A   Occupational History  . retired    Social History Main Topics  .  Smoking status: Never Smoker   . Smokeless tobacco: Never Used  . Alcohol Use: No  . Drug Use: No  . Sexual Activity: Not on file   Other Topics Concern  . Not on file   Social History Narrative   She lives [place] in a [place] mobile home.   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   Cardiovascular: Denies chest pain, chest tightness, palpitations or swelling in the hands or feet.  Gastrointestinal: Pt reports abdominal pain. Denies bloating, constipation, diarrhea or blood in the stool.  GU: Some pain with urination. Denies urgency, frequency, burning sensation, blood in urine, odor or discharge. Skin: Pt  reports rash. Denies ulceration.   No other specific complaints in a complete review of systems (except as listed in HPI above).     Objective:   Physical Exam  BP 128/84 mmHg  Pulse 101  Temp(Src) 98.2 F (36.8 C) (Oral)  Wt 157 lb 12 oz (71.555 kg)  SpO2 97% Wt Readings from Last 3 Encounters:  06/13/15 157 lb 12 oz (71.555 kg)  12/06/14 164 lb 8 oz (74.617 kg)  11/13/14 165 lb 14.4 oz (75.252 kg)    General: Appears her stated age in NAD. Skin: Vesicular pruritic rash on medial condyle of right elbow with a few isolated vesicles on medial forearm  distally. No rashes noted elsewhere.  Cardiovascular: Normal rate and rhythm, slightly tachycardic. S1,S2 noted.  No murmur, rubs or gallops noted. Pulmonary/Chest: Normal effort and positive vesicular breath sounds. No respiratory distress. No wheezes, rales or ronchi noted.  Abdomen: Abdomen soft. Tender in the bilateral lower abdomen. Normal bowel sounds. No CVA tenderness noted.    BMET    Component Value Date/Time   NA 134* 11/13/2014 2050   K 3.5 11/13/2014 2050   CL 98* 11/13/2014 2050   CO2 24 11/13/2014 2050   GLUCOSE 144* 11/13/2014 2050   BUN 23* 11/13/2014 2050   CREATININE 1.18* 11/13/2014 2050   CREATININE 0.90 06/01/2011 1029   CALCIUM 10.0 11/13/2014 2050   CALCIUM 10.3 05/05/2012 1251   GFRNONAA 42* 11/13/2014 2050   GFRAA 48* 11/13/2014 2050    Lipid Panel     Component Value Date/Time   CHOL 180 03/16/2014 0831   TRIG 123.0 03/16/2014 0831   HDL 72.10 03/16/2014 0831   CHOLHDL 2 03/16/2014 0831   VLDL 24.6 03/16/2014 0831   LDLCALC 83 03/16/2014 0831    CBC    Component Value Date/Time   WBC 7.4 11/13/2014 2050   RBC 3.96 11/13/2014 2050   HGB 13.0 11/13/2014 2050   HCT 38.0 11/13/2014 2050   PLT 212 11/13/2014 2050   MCV 96.0 11/13/2014 2050   MCH 32.8 11/13/2014 2050   MCHC 34.2 11/13/2014 2050   RDW 13.0 11/13/2014 2050   LYMPHSABS 1.7 03/16/2014 0831   MONOABS 0.6 03/16/2014 0831    EOSABS 0.2 03/16/2014 0831   BASOSABS 0.0 03/16/2014 0831    Hgb A1C Lab Results  Component Value Date   HGBA1C 5.9* 11/17/2011         Assessment & Plan:   Contact dermatitis:  Kenalog 0.5% cream BID to affected area  UTI:  UA: 3+ leuk,  1+ blood Will send urine culture eRx for Cipro 250 BID x5 days Push fluids  RTC as needed or if symptoms persist or worsen

## 2015-06-13 NOTE — Addendum Note (Signed)
Addended by: Roena MaladyEVONTENNO, Dejan Angert Y on: 06/13/2015 02:39 PM   Modules accepted: Orders

## 2015-06-14 LAB — URINE CULTURE

## 2015-07-27 ENCOUNTER — Ambulatory Visit (INDEPENDENT_AMBULATORY_CARE_PROVIDER_SITE_OTHER): Payer: Medicare Other | Admitting: Neurology

## 2015-07-27 ENCOUNTER — Encounter: Payer: Self-pay | Admitting: Neurology

## 2015-07-27 VITALS — BP 110/70 | HR 86 | Ht 65.0 in | Wt 160.0 lb

## 2015-07-27 DIAGNOSIS — M79603 Pain in arm, unspecified: Secondary | ICD-10-CM

## 2015-07-27 DIAGNOSIS — R269 Unspecified abnormalities of gait and mobility: Secondary | ICD-10-CM

## 2015-07-27 DIAGNOSIS — R202 Paresthesia of skin: Secondary | ICD-10-CM

## 2015-07-27 DIAGNOSIS — I671 Cerebral aneurysm, nonruptured: Secondary | ICD-10-CM

## 2015-07-27 DIAGNOSIS — M79601 Pain in right arm: Secondary | ICD-10-CM

## 2015-07-27 DIAGNOSIS — M79602 Pain in left arm: Secondary | ICD-10-CM

## 2015-07-27 MED ORDER — GABAPENTIN 300 MG PO CAPS: 300.0000 mg | ORAL_CAPSULE | Freq: Every day | ORAL | Status: DC

## 2015-07-27 NOTE — Progress Notes (Signed)
Follow-up Visit   Date: 07/27/2015    [name] MRN: [id number] DOB: Nov 12, 1930   Interim History: [name] is a 82 y.o. right-handed Caucasian female with history of hypertension, stroke (2013, manifesting with dysarthria and left homonymous hemianopia, no residual symptoms), hypertension, and hyperlipidemia returning to the clinic for follow-up of hand paresthesias.  The patient was accompanied to the clinic by niece (Tammy) who also provides collateral information.    History of present illness: Around May 2015, she developed numbness and tingling of the fingertips which initially started on the left hand and then involved the right. Symptoms are worse in the morning and improves as the day goes on. She denies any exacerbating/alleviating factors. She denies any weakness of the hands or neck pain.  No numbness/tingling of the feet. She does reports difficulty with walking and loss of   UPDATE 10/12/2013:  She is here to discuss results of her EMG which was normal (bilateral upper extremities). At her last visit, I recommended she start taking Neurontin to see if this will help her symptoms, but did not notice a significant change.  On 8/24, she developed a red rash over her arms, legs, and abdomen. Lesion are pustular with clear liquid and itchy.   Patient feels it is dust mites.  She did stop her neurontin last week to see if it was medication effect.     UPDATE 01/14/2014:  She was clearing leaves from her yard and was using the leaf blower and tripped over the wire and hurt her right thumb (no fracture).  She completed physical therapy which helped her stabilize her walking.  No significant change in her paresthesias with taking gabapentin.  She is currently taking both gabapentin  and nortriptyline , but was supposed to stop gabapentin due to ?drug-related rash, but forgot to do.  No new rash.  UPDATE 07/25/2014:  She is here for 34-month return visit.  Hand  paresthesias are somewhat better than the last time.  She is most bothered by her leg swelling and feels that gabapentin may be causing this.  She is also having injections for retinopathy.  She continues to get vitamin B12 injections every other month and feels much better when she takes this.  She has not had any falls since her last visit and ambulates with a cane which supports her.   UPDATE 07/27/2015:  She has no new complaints today.  No interval falls.  She had one ED visit for kidney stone, which unfortunately still has not passed.  Otherwise, she is doing great.  She continues to live independently, drive, and manage finances.  Her niece does not have any concerns with home safety or driving.  Medications:  Current Outpatient Prescriptions on File Prior to Visit  Medication Sig Dispense Refill  . Ascorbic Acid (VITAMIN C PO) Take 1 tablet by mouth.    . Cholecalciferol (VITAMIN D PO) Take 1 tablet by mouth daily.    . ciprofloxacin (CIPRO) 250 MG tablet Take 1 tablet (250 mg total) by mouth 2 (two) times daily. 10 tablet 0  . clopidogrel (PLAVIX) 75 MG tablet TAKE 1 TABLET BY MOUTH EVERY DAY 90 tablet 0  . Cyanocobalamin (VITAMIN B-12 IJ) Inject as directed See admin instructions. Inject every 2 months - last injection August 2016    . dorzolamide (TRUSOPT) 2 % ophthalmic solution Place 1 drop into the right eye 2 (two) times daily.  3  . fish oil-omega-3 fatty acids 1000 MG  capsule Take 1 g by mouth daily.    Marland Kitchen. gabapentin (NEURONTIN) 300 MG capsule Take 2 capsules (600 mg total) by mouth at bedtime. (Patient taking differently: Take 300 mg by mouth at bedtime. ) 60 capsule 3  . hydrochlorothiazide (HYDRODIURIL) 25 MG tablet TAKE 1 TABLET (25 MG TOTAL) BY MOUTH DAILY 30 tablet 2  . losartan (COZAAR) 100 MG tablet TAKE 1 TABLET (100 MG TOTAL) BY MOUTH DAILY. 30 tablet 10  . sertraline (ZOLOFT) 25 MG tablet TAKE 1 TABLET BY MOUTH DAILY 30 tablet 2  . timolol (TIMOPTIC) 0.5 % ophthalmic  solution Place 1 drop into the right eye 2 (two) times daily.  3  . traMADol (ULTRAM) 50 MG tablet Take 1 tablet (50 mg total) by mouth every 6 (six) hours as needed. 60 tablet 3  . triamcinolone ointment (KENALOG) 0.5 % Apply 1 application topically 2 (two) times daily. 30 g 0   No current facility-administered medications on file prior to visit.    Allergies:  Allergies  Allergen Reactions  . Celecoxib Other (See Comments)    affects kidneys  . Codeine Nausea Only and Other (See Comments)    dizziness  . Lisinopril Cough  . Etodolac Rash     Review of Systems:  CONSTITUTIONAL: No fevers, chills, night sweats, or weight loss.   EYES: No visual changes or eye pain ENT: No hearing changes.  No history of nose bleeds.   RESPIRATORY: No cough, wheezing and shortness of breath.   CARDIOVASCULAR: Negative for chest pain, and palpitations.   GI: Negative for abdominal discomfort, blood in stools or black stools.  No recent change in bowel habits.   GU:  No history of incontinence.   MUSCLOSKELETAL: No history of joint pain +swelling.  No myalgias.   SKIN: Negative for lesions,+ rash, and itching.   ENDOCRINE: Negative for cold or heat intolerance, polydipsia or goiter.   PSYCH:  No depression or anxiety symptoms.   NEURO: As Above.   Vital Signs:  BP 110/70 mmHg  Pulse 86  Ht 5\' 5"  (1.651 m)  Wt 160 lb (72.576 kg)  BMI 26.63 kg/m2  SpO2 97%  Neurological Exam: MENTAL STATUS including orientation to time, place, person, recent and remote memory, attention span and concentration, language, and fund of knowledge is normal.  Speech is not dysarthric.  CRANIAL NERVES:   Face is symmetric.  MOTOR:  Motor strength is 5/5 in all extremities, except bilateral ABP 5-/5.  Trace bilateral ankle edema.   MSRs:  Reflexes are 2+/4 in the upper extremities and 1+ in the lower extremities throughout.  COORDINATION/GAIT: Gait appears ataxic and unsteady.    Data: EMG of the upper  extremities 09/15/2013: This is a normal study of the right and left upper extremities. In particular, there is no evidence of generalized sensorimotor polyneuropathy, cervical radiculopathy, or carpal tunnel syndrome.  MRI brain wo contrast 11/17/2011:  No acute or subacute infarction.  Old inferior cerebellar infarction on the left.  Chronic small vessel changes of the pons in the cerebral hemispheric white matter, fairly typical for age.   MRA head 11/17/2011:  No major vessel occlusion.  No significant anterior circulation stenotic disease.  40% stenosis of the basilar artery in the proximal third.  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    IMPRESSION/PLAN: Paresthesias of the hands - stable.   Etiology is uncertain (? Low B12), as there is no neuropathy, CTS, or radiculopathy on EMG.  Continue gabapentin 300mg  at bedtime  Vitamin B12 deficiency, continue vitamin B12 supplementation  Large fiber neuropathy affecting the feet, likely idiopathic, and contributing to sensory ataxia/gait instability  - stable Stressed the importance of using a cane  History of left posterior frontal and parietal infarction (2010), no residual deficits  Continue plavix and secondary risk prevention   Right cavernous carotid 4mm aneurysm Check MRA head for annual surveillance   Return to clinic in 1 year   The duration of this appointment visit was 30 minutes of face-to-face time with the patient.  Greater than 50% of this time was spent in counseling, explanation of diagnosis, planning of further management, and coordination of care.   Thank you for allowing me to participate in patient's care.  If I can answer any additional questions, I would be pleased to do so.    Sincerely,    Jaryiah Mehlman K. Allena Katz, DO

## 2015-07-27 NOTE — Patient Instructions (Signed)
1.  MRA head  2.  Continue gabapenitn 300mg  at bedtime  Return to clinic in 1 year

## 2015-08-03 ENCOUNTER — Ambulatory Visit
Admission: RE | Admit: 2015-08-03 | Discharge: 2015-08-03 | Disposition: A | Discharge status: Home / Self Care | Payer: Medicare Other | Source: Ambulatory Visit | Attending: Neurology | Admitting: Neurology

## 2015-08-03 DIAGNOSIS — I6521 Occlusion and stenosis of right carotid artery: Secondary | ICD-10-CM | POA: Diagnosis not present

## 2015-08-03 DIAGNOSIS — I671 Cerebral aneurysm, nonruptured: Secondary | ICD-10-CM

## 2015-08-09 ENCOUNTER — Other Ambulatory Visit: Payer: Self-pay | Admitting: Family Medicine

## 2015-08-09 NOTE — Telephone Encounter (Signed)
Electronic refill request, please advise  

## 2015-08-09 NOTE — Telephone Encounter (Signed)
Px written for call in   

## 2015-08-10 ENCOUNTER — Other Ambulatory Visit: Payer: Self-pay | Admitting: Family Medicine

## 2015-08-10 NOTE — Telephone Encounter (Signed)
Called in tramadol to pharmacy.

## 2015-08-21 DIAGNOSIS — H348112 Central retinal vein occlusion, right eye, stable: Secondary | ICD-10-CM | POA: Diagnosis not present

## 2015-08-21 DIAGNOSIS — H4051X2 Glaucoma secondary to other eye disorders, right eye, moderate stage: Secondary | ICD-10-CM | POA: Diagnosis not present

## 2015-08-21 DIAGNOSIS — H211X1 Other vascular disorders of iris and ciliary body, right eye: Secondary | ICD-10-CM | POA: Diagnosis not present

## 2015-09-28 ENCOUNTER — Telehealth: Payer: Self-pay | Admitting: Family Medicine

## 2015-09-28 NOTE — Telephone Encounter (Signed)
I will see her then  

## 2015-09-28 NOTE — Telephone Encounter (Signed)
Patient Name: [name]  DOB: Jun 05, 1930    Initial Comment Caller states feeling like she's going to pass out in the mornings.    Nurse Assessment  Nurse: Stefano GaulStringer, RN, Vera Date/Time (Eastern Time): 09/28/2015 1:32:31 PM  Confirm and document reason for call. If symptomatic, describe symptoms. You must click the next button to save text entered. ---Caller states the past 2 mornings she has felt like she was going to pass out. Took melantonin and went back to sleep and felt better yesterday. she was sweating earlier.  Has the patient traveled out of the country within the last 30 days? ---Not Applicable  Does the patient have any new or worsening symptoms? ---Yes  Will a triage be completed? ---Yes  Related visit to physician within the last 2 weeks? ---No  Does the PT have any chronic conditions? (i.e. diabetes, asthma, etc.) ---Yes  List chronic conditions. ---CVA; HTN  Is this a behavioral health or substance abuse call? ---No     Guidelines    Guideline Title Affirmed Question Affirmed Notes  Dizziness - Lightheadedness Taking a medicine that could cause dizziness (e.g., blood pressure medications, diuretics)    Final Disposition User   See Physician within 24 Hours Pine ForestStringer, RN, Dwana CurdVera    Comments  appt scheduled for 09/29/2015 at 11:45 am with Dr. Milinda Antisower   Referrals  REFERRED TO PCP OFFICE   Disagree/Comply: Comply

## 2015-09-28 NOTE — Telephone Encounter (Signed)
Pt has appt 09/29/15 11:45 with Dr Milinda Antisower.

## 2015-09-29 ENCOUNTER — Ambulatory Visit (INDEPENDENT_AMBULATORY_CARE_PROVIDER_SITE_OTHER): Payer: Medicare Other | Admitting: Family Medicine

## 2015-09-29 ENCOUNTER — Encounter: Payer: Self-pay | Admitting: Family Medicine

## 2015-09-29 VITALS — BP 104/68 | HR 90 | Temp 98.0°F | Ht 65.0 in | Wt 161.5 lb

## 2015-09-29 DIAGNOSIS — E538 Deficiency of other specified B group vitamins: Secondary | ICD-10-CM | POA: Diagnosis not present

## 2015-09-29 DIAGNOSIS — R300 Dysuria: Secondary | ICD-10-CM

## 2015-09-29 DIAGNOSIS — I10 Essential (primary) hypertension: Secondary | ICD-10-CM

## 2015-09-29 DIAGNOSIS — R42 Dizziness and giddiness: Secondary | ICD-10-CM | POA: Insufficient documentation

## 2015-09-29 LAB — COMPREHENSIVE METABOLIC PANEL
ALT: 16 U/L (ref 0–35)
AST: 21 U/L (ref 0–37)
Albumin: 4.2 g/dL (ref 3.5–5.2)
Alkaline Phosphatase: 75 U/L (ref 39–117)
BILIRUBIN TOTAL: 0.5 mg/dL (ref 0.2–1.2)
BUN: 17 mg/dL (ref 6–23)
CALCIUM: 10.2 mg/dL (ref 8.4–10.5)
CO2: 29 meq/L (ref 19–32)
CREATININE: 0.99 mg/dL (ref 0.40–1.20)
Chloride: 103 mEq/L (ref 96–112)
GFR: 56.86 mL/min — ABNORMAL LOW (ref >60.00)
GLUCOSE: 100 mg/dL — AB (ref 70–99)
Potassium: 3.8 mEq/L (ref 3.5–5.1)
Sodium: 137 mEq/L (ref 135–145)
Total Protein: 7 g/dL (ref 6.0–8.3)

## 2015-09-29 LAB — POC URINALSYSI DIPSTICK (AUTOMATED)
BILIRUBIN UA: NEGATIVE
Blood, UA: NEGATIVE
Glucose, UA: NEGATIVE
Ketones, UA: NEGATIVE
LEUKOCYTES UA: NEGATIVE
NITRITE UA: NEGATIVE
PH UA: 6
PROTEIN UA: NEGATIVE
Spec Grav, UA: >=1.03
Urobilinogen, UA: 0.2

## 2015-09-29 MED ORDER — CYANOCOBALAMIN 1000 MCG/ML IJ SOLN
1000.0000 ug | Freq: Once | INTRAMUSCULAR | Status: AC
  Administered 2015-09-29: 1000 ug via INTRAMUSCULAR

## 2015-09-29 NOTE — Patient Instructions (Addendum)
If you are taking lisinopril -stop it  Go through your pill box and review all your medicines and make sure they line up with our list - have a family member help you  Don't take tramadol unless you really need it for pain-it can make you dizzy and sleepy  Try to drink more water (not a lot of coffee or tea) also cut back down on cranberry juice- it is acidic and can cause urinary burning  B12 shot today Your urine test is clear today  Labs today - for kidney function and chemistry levels

## 2015-09-29 NOTE — Progress Notes (Signed)
Subjective:    Patient ID: [name], female    DOB: [birth year], 82 y.o.   MRN: [id number]  HPI Here for acute visit for dizziness/dysuria   Last few days - got up and ate breakfast and then felt generally light headed  Never passed out  One day she took otc melatonin (for anxiety) -to see if it would help and it did   Wt Readings from Last 3 Encounters:  09/29/15 161 lb 8 oz (73.3 kg)  07/27/15 160 lb (72.6 kg)  06/13/15 157 lb 12 oz (71.6 kg)   BP Readings from Last 3 Encounters:  09/29/15 104/68  07/27/15 110/70  06/13/15 128/84   bp medicines  Cozaar Lisinopril- [facility] (? If she is taking it and from who)  hctz   Takes tramadol for pain -does take it in the am     Chemistry      Component Value Date/Time   NA 134 (L) 11/13/2014 2050   K 3.5 11/13/2014 2050   CL 98 (L) 11/13/2014 2050   CO2 24 11/13/2014 2050   BUN 23 (H) 11/13/2014 2050   CREATININE 1.18 (H) 11/13/2014 2050   CREATININE 0.90 06/01/2011 1029      Component Value Date/Time   CALCIUM 10.0 11/13/2014 2050   CALCIUM 10.3 05/05/2012 1251   ALKPHOS 90 11/13/2014 2050   AST 30 11/13/2014 2050   ALT 22 11/13/2014 2050   BILITOT 0.4 11/13/2014 2050       Lab Results  Component Value Date   VITAMINB12 >1500 (H) 03/16/2014   She thinks she needs a B12 shot - has not had one in a long time   Does not usually have a family member come with her to the doctor   Results for orders placed or performed in visit on 09/29/15  POCT Urinalysis Dipstick (Automated)  Result Value Ref Range   Color, UA Yellow    Clarity, UA Clear    Glucose, UA Negative    Bilirubin, UA Negative    Ketones, UA Negative    Spec Grav, UA >=1.030    Blood, UA Negative    pH, UA 6.0    Protein, UA Negative    Urobilinogen, UA 0.2    Nitrite, UA Negative    Leukocytes, UA Negative Negative   had some burning on urination for a while - on and off  Was ok this am  No vaginal discharge   Patient Active Problem List     Diagnosis Date Noted  . Light headed 09/29/2015  . Dysuria 09/29/2015  . Anxiety and depression 09/21/2014  . Pedal edema 06/22/2014  . Encounter for Medicare annual wellness exam 03/18/2014  . Retinal vein occlusion 03/18/2014  . B12 deficiency 03/15/2014  . Numbness in both hands 07/20/2013  . Cough 07/20/2013  . Heme + stool 06/02/2013  . Viral gastroenteritis 04/19/2013  . Colon cancer screening 04/19/2013  . At risk for falling 12/02/2011  . Hyperparathyroidism (HCC) 11/20/2011  . Back pain, thoracic 09/30/2011  . Scoliosis 09/30/2011  . Degenerative disc disease, lumbar 09/30/2011  . Renal insufficiency 05/27/2011  . Syncope 12/05/2010  . CVA 02/26/2008  . Hyperlipidemia 05/12/2006  . Essential hypertension 05/12/2006  . DIVERTICULOSIS, COLON 05/12/2006  . Osteopenia 05/12/2006   Past Medical History:  Diagnosis Date  . Chronic pain   . DDD (degenerative disc disease)   . Diverticulosis   . Hyperlipidemia   . Hypertension   . Osteopenia   . Stroke (HCC)   .  Syncope    Past Surgical History:  Procedure Laterality Date  . ABDOMINAL HYSTERECTOMY    . carotid doppler     normal  . DOPPLER ECHOCARDIOGRAPHY     normal  . EXPLORATORY LAPAROTOMY     2 times both neg   . LAMINECTOMY  06-2003   l2-l4  . SPINE SURGERY     l3-l4 microdiscectomy   . TONSILLECTOMY     Social History  Substance Use Topics  . Smoking status: Never Smoker  . Smokeless tobacco: Never Used  . Alcohol use No   Family History  Problem Relation Age of Onset  . Hypertension Father   . COPD Father   . Other Mother     Deceased, 2894  . Healthy Brother    Allergies  Allergen Reactions  . Celecoxib Other (See Comments)    affects kidneys  . Codeine Nausea Only and Other (See Comments)    dizziness  . Lisinopril Cough  . Etodolac Rash   Current Outpatient Prescriptions on File Prior to Visit  Medication Sig Dispense Refill  . Ascorbic Acid (VITAMIN C PO) Take 1 tablet by mouth.     . Cholecalciferol (VITAMIN D PO) Take 1 tablet by mouth daily.    . clopidogrel (PLAVIX) 75 MG tablet TAKE 1 TABLET BY MOUTH EVERY DAY 90 tablet 0  . Cyanocobalamin (VITAMIN B-12 IJ) Inject as directed See admin instructions. Inject every 2 months - last injection August 2016    . dorzolamide (TRUSOPT) 2 % ophthalmic solution Place 1 drop into the right eye 2 (two) times daily.  3  . fish oil-omega-3 fatty acids 1000 MG capsule Take 1 g by mouth daily.    Marland Kitchen. gabapentin (NEURONTIN) 300 MG capsule Take 1 capsule (300 mg total) by mouth at bedtime. 90 capsule 3  . hydrochlorothiazide (HYDRODIURIL) 25 MG tablet TAKE 1 TABLET BY MOUTH DAILY 90 tablet 1  . losartan (COZAAR) 100 MG tablet TAKE 1 TABLET (100 MG TOTAL) BY MOUTH DAILY. 30 tablet 10  . sertraline (ZOLOFT) 25 MG tablet TAKE 1 TABLET BY MOUTH DAILY 90 tablet 1  . timolol (TIMOPTIC) 0.5 % ophthalmic solution Place 1 drop into the right eye 2 (two) times daily.  3  . traMADol (ULTRAM) 50 MG tablet TAKE 1 TABLET BY MOUTH EVERY 6 HOURS AS NEEDED. 60 tablet 3   No current facility-administered medications on file prior to visit.      Review of Systems    Review of Systems  Constitutional: Negative for fever, appetite change, and unexpected weight change.  Eyes: Negative for pain and visual disturbance.  Respiratory: Negative for cough and shortness of breath.   Cardiovascular: Negative for cp or palpitations    Gastrointestinal: Negative for nausea, diarrhea and constipation.  Genitourinary: Negative for urgency and frequency. pos for dysuria intermittent , neg for hematuria or vag d/c or itching  Skin: Negative for pallor or rash   Neurological: Negative for weakness, light-headedness, numbness and headaches.  Hematological: Negative for adenopathy. Does not bruise/bleed easily.  Psychiatric/Behavioral: Negative for dysphoric mood. The patient is not nervous/anxious.      Objective:   Physical Exam  Constitutional: She appears  well-developed and well-nourished. No distress.  Well appearing   HENT:  Head: Normocephalic and atraumatic.  Mouth/Throat: Oropharynx is clear and moist.  Eyes: Conjunctivae and EOM are normal. Pupils are equal, round, and reactive to light.  Neck: Normal range of motion. Neck supple. No JVD present. Carotid bruit is not  present. No thyromegaly present.  Cardiovascular: Normal rate, regular rhythm, normal heart sounds and intact distal pulses.  Exam reveals no gallop.   Pulmonary/Chest: Effort normal and breath sounds normal. No respiratory distress. She has no wheezes. She has no rales.  No crackles  Abdominal: Soft. Bowel sounds are normal. She exhibits no distension, no abdominal bruit and no mass. There is no tenderness.  Musculoskeletal: She exhibits no edema.  Lymphadenopathy:    She has no cervical adenopathy.  Neurological: She is alert. She has normal reflexes. No cranial nerve deficit or sensory deficit. She exhibits normal muscle tone. She displays a negative Romberg sign. Coordination and gait normal.  Pt is steady today    Skin: Skin is warm and dry. No rash noted. No pallor.  Psychiatric: She has a normal mood and affect. Her behavior is normal.          Assessment & Plan:   Problem List Items Addressed This Visit      Cardiovascular and Mediastinum   Essential hypertension    BP: 104/68 (no clear orthostasis) Pt c/o some dizziness after taking medications in the am Appears to be on ace and arb?  Will hold the ace and continue the arb Continue to watch b/p- f/u planned        Relevant Orders   Comprehensive metabolic panel (Completed)     Digestive   B12 deficiency    B12 shot today      Relevant Medications   cyanocobalamin ((VITAMIN B-12)) injection 1,000 mcg (Completed)     Other   Light headed    Not positional Happens after am meds Will hodd ace for borderline low bp  Also hold tramadol in am - disc pot for side eff and stressed to take only  when needed with caution  Make sure water intake is good  ua nl today cmet today Update if worse or not improved       Dysuria - Primary   Relevant Orders   POCT Urinalysis Dipstick (Automated) (Completed)    Other Visit Diagnoses   None.

## 2015-09-29 NOTE — Progress Notes (Signed)
Pre visit review using our clinic review tool, if applicable. No additional management support is needed unless otherwise documented below in the visit note. 

## 2015-10-01 NOTE — Assessment & Plan Note (Signed)
Not positional Happens after am meds Will hodd ace for borderline low bp  Also hold tramadol in am - disc pot for side eff and stressed to take only when needed with caution  Make sure water intake is good  ua nl today cmet today Update if worse or not improved

## 2015-10-01 NOTE — Assessment & Plan Note (Signed)
B12 shot today. 

## 2015-10-01 NOTE — Assessment & Plan Note (Signed)
BP: 104/68 (no clear orthostasis) Pt c/o some dizziness after taking medications in the am Appears to be on ace and arb?  Will hold the ace and continue the arb Continue to watch b/p- f/u planned

## 2015-10-01 NOTE — Assessment & Plan Note (Signed)
Neg ua  ? Urethritis or bladder irritation  Disc stopping beverages besides water /esp acidic ones Inc water Update if no improvement

## 2015-10-02 ENCOUNTER — Telehealth: Payer: Self-pay

## 2015-10-02 MED ORDER — SERTRALINE HCL 25 MG PO TABS: 50.0000 mg | ORAL_TABLET | Freq: Every day | ORAL | 0 refills | Status: DC

## 2015-10-02 NOTE — Telephone Encounter (Signed)
Pt notified of Dr. Royden Purlower's comments and verbalized understanding, pt will keep with the 2 tabs daily and new Rx sent to pharmacy

## 2015-10-02 NOTE — Telephone Encounter (Signed)
If she has just raised dose -it may take about a month to notice an improvement - if side eff/ alert us  Please send in for the higher dose if she wants to continue it - 3 mo Keep me posted

## 2015-10-02 NOTE — Telephone Encounter (Signed)
Pt left v/m; pt has been taking zoloft 25 mg one a day; pts niece advised pt to take zoloft 25 mg two daily. Pt wanted Dr Milinda Antisower to know that she is taking zoloft 25 mg two in the morning for the last 2 days. Pt is not sure that taking 2 is helping yet. [facility].

## 2015-10-26 ENCOUNTER — Other Ambulatory Visit: Payer: Self-pay | Admitting: Family Medicine

## 2015-11-21 DIAGNOSIS — H4051X2 Glaucoma secondary to other eye disorders, right eye, moderate stage: Secondary | ICD-10-CM | POA: Diagnosis not present

## 2015-11-21 DIAGNOSIS — H348112 Central retinal vein occlusion, right eye, stable: Secondary | ICD-10-CM | POA: Diagnosis not present

## 2015-11-21 DIAGNOSIS — H211X1 Other vascular disorders of iris and ciliary body, right eye: Secondary | ICD-10-CM | POA: Diagnosis not present

## 2015-12-13 ENCOUNTER — Other Ambulatory Visit: Payer: Self-pay | Admitting: Family Medicine

## 2015-12-26 ENCOUNTER — Ambulatory Visit (INDEPENDENT_AMBULATORY_CARE_PROVIDER_SITE_OTHER): Payer: Medicare Other | Admitting: Family Medicine

## 2015-12-26 ENCOUNTER — Encounter: Payer: Self-pay | Admitting: Family Medicine

## 2015-12-26 DIAGNOSIS — B9789 Other viral agents as the cause of diseases classified elsewhere: Secondary | ICD-10-CM | POA: Diagnosis not present

## 2015-12-26 DIAGNOSIS — J069 Acute upper respiratory infection, unspecified: Secondary | ICD-10-CM | POA: Diagnosis not present

## 2015-12-26 MED ORDER — BENZONATATE 200 MG PO CAPS: 200.0000 mg | ORAL_CAPSULE | Freq: Two times a day (BID) | ORAL | 0 refills | Status: DC | PRN

## 2015-12-26 NOTE — Progress Notes (Signed)
Pre visit review using our clinic review tool, if applicable. No additional management support is needed unless otherwise documented below in the visit note. 

## 2015-12-26 NOTE — Assessment & Plan Note (Signed)
No clear sign of bacterial infection.. Likely viral URI along with allergies.  Treat with mucinex, benzonatate and flonase to decrease post nasal drip.

## 2015-12-26 NOTE — Patient Instructions (Signed)
Use benzonatate for cough as needed .  Start Flonase OTC 2 sprays per nostril daily.  Can use plain mucinex as well for congestion.  Call if  Not improving in 4-5 days or if new shortness of breath or fever.

## 2015-12-26 NOTE — Progress Notes (Signed)
   Subjective:    Patient ID: [name], female    DOB: [birth year], 82 y.o.   MRN: [id number]  Cough  This is a new problem. The current episode started 1 to 4 weeks ago (2 weeks). The problem has been gradually worsening. The cough is productive of sputum. Associated symptoms include nasal congestion and rhinorrhea. Pertinent negatives include no chills, ear pain, fever, headaches, sore throat, shortness of breath or wheezing. Associated symptoms comments:  No sinus pressure  fatigue  cough keeping her up at night. The symptoms are aggravated by lying down. Risk factors: nonsmoker. Treatments tried: OTC cough meds, aleve. The treatment provided mild relief. There is no history of asthma, bronchitis, COPD or environmental allergies.   BP Readings from Last 3 Encounters:  12/26/15 (!) 136/97  09/29/15 104/68  07/27/15 110/70       Review of Systems  Constitutional: Negative for chills and fever.  HENT: Positive for rhinorrhea. Negative for ear pain and sore throat.   Respiratory: Positive for cough. Negative for shortness of breath and wheezing.   Allergic/Immunologic: Negative for environmental allergies.  Neurological: Negative for headaches.       Objective:   Physical Exam  Constitutional: Vital signs are normal. She appears well-developed and well-nourished. She is cooperative.  Non-toxic appearance. She does not appear ill. No distress.  HENT:  Head: Normocephalic.  Right Ear: Hearing, tympanic membrane, external ear and ear canal normal. Tympanic membrane is not erythematous, not retracted and not bulging.  Left Ear: Hearing, tympanic membrane, external ear and ear canal normal. Tympanic membrane is not erythematous, not retracted and not bulging.  Nose: Mucosal edema and rhinorrhea present. Right sinus exhibits no maxillary sinus tenderness and no frontal sinus tenderness. Left sinus exhibits no maxillary sinus tenderness and no frontal sinus tenderness.  Mouth/Throat:  Uvula is midline, oropharynx is clear and moist and mucous membranes are normal.  Eyes: Conjunctivae, EOM and lids are normal. Pupils are equal, round, and reactive to light. Lids are everted and swept, no foreign bodies found.  Neck: Trachea normal and normal range of motion. Neck supple. Carotid bruit is not present. No thyroid mass and no thyromegaly present.  Cardiovascular: Normal rate, regular rhythm, S1 normal, S2 normal, normal heart sounds, intact distal pulses and normal pulses.  Exam reveals no gallop and no friction rub.   No murmur heard. Pulmonary/Chest: Effort normal and breath sounds normal. No tachypnea. No respiratory distress. She has no decreased breath sounds. She has no wheezes. She has no rhonchi. She has no rales.  Neurological: She is alert.  Skin: Skin is warm, dry and intact. No rash noted.  Psychiatric: Her speech is normal and behavior is normal. Judgment normal. Her mood appears not anxious. Cognition and memory are normal. She does not exhibit a depressed mood.          Assessment & Plan:

## 2015-12-27 ENCOUNTER — Ambulatory Visit: Payer: Medicare Other | Admitting: Family Medicine

## 2015-12-31 ENCOUNTER — Telehealth: Payer: Self-pay | Admitting: Family Medicine

## 2015-12-31 DIAGNOSIS — E213 Hyperparathyroidism, unspecified: Secondary | ICD-10-CM

## 2015-12-31 DIAGNOSIS — E538 Deficiency of other specified B group vitamins: Secondary | ICD-10-CM

## 2015-12-31 DIAGNOSIS — Z0000 Encounter for general adult medical examination without abnormal findings: Secondary | ICD-10-CM

## 2015-12-31 NOTE — Telephone Encounter (Signed)
-----   Message from Alvina Chouerri J Walsh sent at 12/27/2015 10:46 AM EST ----- Regarding: Lab orders for Tuesdday, 11.21.17  AWV lab orders, please.

## 2016-01-02 ENCOUNTER — Ambulatory Visit (INDEPENDENT_AMBULATORY_CARE_PROVIDER_SITE_OTHER): Payer: Medicare Other

## 2016-01-02 ENCOUNTER — Other Ambulatory Visit (INDEPENDENT_AMBULATORY_CARE_PROVIDER_SITE_OTHER): Payer: Medicare Other

## 2016-01-02 VITALS — BP 110/70 | HR 68 | Temp 97.8°F | Ht 63.0 in | Wt 164.2 lb

## 2016-01-02 DIAGNOSIS — E538 Deficiency of other specified B group vitamins: Secondary | ICD-10-CM

## 2016-01-02 DIAGNOSIS — Z23 Encounter for immunization: Secondary | ICD-10-CM | POA: Diagnosis not present

## 2016-01-02 DIAGNOSIS — Z0000 Encounter for general adult medical examination without abnormal findings: Secondary | ICD-10-CM | POA: Diagnosis not present

## 2016-01-02 DIAGNOSIS — E213 Hyperparathyroidism, unspecified: Secondary | ICD-10-CM

## 2016-01-02 LAB — LIPID PANEL
CHOL/HDL RATIO: 2
Cholesterol: 213 mg/dL — ABNORMAL HIGH (ref 0–200)
HDL: 86.7 mg/dL (ref >39.00)
LDL CALC: 115 mg/dL — AB (ref 0–99)
NONHDL: 126.66
Triglycerides: 60 mg/dL (ref 0.0–149.0)
VLDL: 12 mg/dL (ref 0.0–40.0)

## 2016-01-02 LAB — COMPREHENSIVE METABOLIC PANEL
ALBUMIN: 4.1 g/dL (ref 3.5–5.2)
ALT: 23 U/L (ref 0–35)
AST: 23 U/L (ref 0–37)
Alkaline Phosphatase: 66 U/L (ref 39–117)
BILIRUBIN TOTAL: 0.5 mg/dL (ref 0.2–1.2)
BUN: 19 mg/dL (ref 6–23)
CALCIUM: 10.3 mg/dL (ref 8.4–10.5)
CHLORIDE: 102 meq/L (ref 96–112)
CO2: 30 meq/L (ref 19–32)
Creatinine, Ser: 0.92 mg/dL (ref 0.40–1.20)
GFR: 61.84 mL/min (ref >60.00)
Glucose, Bld: 102 mg/dL — ABNORMAL HIGH (ref 70–99)
Potassium: 3.7 mEq/L (ref 3.5–5.1)
Sodium: 139 mEq/L (ref 135–145)
Total Protein: 7.1 g/dL (ref 6.0–8.3)

## 2016-01-02 LAB — VITAMIN D 25 HYDROXY (VIT D DEFICIENCY, FRACTURES): VITD: 26.42 ng/mL — ABNORMAL LOW (ref 30.00–100.00)

## 2016-01-02 LAB — CBC WITH DIFFERENTIAL/PLATELET
BASOS ABS: 0 10*3/uL (ref 0.0–0.1)
BASOS PCT: 0.3 % (ref 0.0–3.0)
Eosinophils Absolute: 0.3 10*3/uL (ref 0.0–0.7)
Eosinophils Relative: 3.3 % (ref 0.0–5.0)
HEMATOCRIT: 37 % (ref 36.0–46.0)
HEMOGLOBIN: 12.6 g/dL (ref 12.0–15.0)
LYMPHS PCT: 22.2 % (ref 12.0–46.0)
Lymphs Abs: 1.8 10*3/uL (ref 0.7–4.0)
MCHC: 33.9 g/dL (ref 30.0–36.0)
MCV: 96.4 fl (ref 78.0–100.0)
MONOS PCT: 9.1 % (ref 3.0–12.0)
Monocytes Absolute: 0.8 10*3/uL (ref 0.1–1.0)
NEUTROS ABS: 5.4 10*3/uL (ref 1.4–7.7)
Neutrophils Relative %: 65.1 % (ref 43.0–77.0)
PLATELETS: 234 10*3/uL (ref 150.0–400.0)
RBC: 3.84 Mil/uL — ABNORMAL LOW (ref 3.87–5.11)
RDW: 14.2 % (ref 11.5–15.5)
WBC: 8.3 10*3/uL (ref 4.0–10.5)

## 2016-01-02 LAB — VITAMIN B12

## 2016-01-02 LAB — TSH: TSH: 0.8 u[IU]/mL (ref 0.35–4.50)

## 2016-01-02 MED ORDER — CYANOCOBALAMIN 1000 MCG/ML IJ SOLN
1000.0000 ug | Freq: Once | INTRAMUSCULAR | Status: AC
  Administered 2016-01-02: 1000 ug via INTRAMUSCULAR

## 2016-01-02 NOTE — Progress Notes (Signed)
Subjective:   [name] is a 82 y.o. female who presents for Medicare Annual (Subsequent) preventive examination.  Review of Systems:  N/A Cardiac Risk Factors include: advanced age (>5455men, 26>65 women);dyslipidemia;hypertension;sedentary lifestyle     Objective:     Vitals: BP 110/70 (BP Location: Left Arm, Patient Position: Sitting, Cuff Size: Normal)   Pulse 68   Temp 97.8 F (36.6 C) (Oral)   Ht 5\' 3"  (1.6 m) Comment: no shoes  Wt 164 lb 4 oz (74.5 kg)   SpO2 96%   BMI 29.10 kg/m   Body mass index is 29.1 kg/m.   Tobacco History  Smoking Status  . Never Smoker  Smokeless Tobacco  . Never Used     Counseling given: No   Past Medical History:  Diagnosis Date  . Chronic pain   . DDD (degenerative disc disease)   . Diverticulosis   . Hyperlipidemia   . Hypertension   . Osteopenia   . Stroke (HCC)   . Syncope    Past Surgical History:  Procedure Laterality Date  . ABDOMINAL HYSTERECTOMY    . carotid doppler     normal  . DOPPLER ECHOCARDIOGRAPHY     normal  . EXPLORATORY LAPAROTOMY     2 times both neg   . LAMINECTOMY  06-2003   l2-l4  . SPINE SURGERY     l3-l4 microdiscectomy   . TONSILLECTOMY     Family History  Problem Relation Age of Onset  . Hypertension Father   . COPD Father   . Other Mother     Deceased, 3494  . Healthy Brother    History  Sexual Activity  . Sexual activity: No    Outpatient Encounter Prescriptions as of 01/02/2016  Medication Sig  . Ascorbic Acid (VITAMIN C PO) Take 1 tablet by mouth.  . benzonatate (TESSALON) 200 MG capsule Take 1 capsule (200 mg total) by mouth 2 (two) times daily as needed for cough.  . Cholecalciferol (VITAMIN D PO) Take 1 tablet by mouth daily.  . clopidogrel (PLAVIX) 75 MG tablet TAKE 1 TABLET BY MOUTH EVERY DAY  . Cyanocobalamin (VITAMIN B-12 IJ) Inject as directed See admin instructions. Inject every 2 months - last injection August 2016  . dorzolamide (TRUSOPT) 2 % ophthalmic  solution Place 1 drop into the right eye 2 (two) times daily.  . fish oil-omega-3 fatty acids 1000 MG capsule Take 1 g by mouth daily.  Marland Kitchen. gabapentin (NEURONTIN) 300 MG capsule Take 1 capsule (300 mg total) by mouth at bedtime.  . hydrochlorothiazide (HYDRODIURIL) 25 MG tablet TAKE 1 TABLET BY MOUTH DAILY  . losartan (COZAAR) 100 MG tablet TAKE 1 TABLET (100 MG TOTAL) BY MOUTH DAILY  . sertraline (ZOLOFT) 25 MG tablet Take 2 tablets (50 mg total) by mouth daily.  . timolol (TIMOPTIC) 0.5 % ophthalmic solution Place 1 drop into the right eye 2 (two) times daily.  . traMADol (ULTRAM) 50 MG tablet TAKE 1 TABLET BY MOUTH EVERY 6 HOURS AS NEEDED.  . [EXPIRED] cyanocobalamin ((VITAMIN B-12)) injection 1,000 mcg    No facility-administered encounter medications on file as of 01/02/2016.     Activities of Daily Living In your present state of health, do you have any difficulty performing the following activities: 01/02/2016  Hearing? Y  Vision? Y  Difficulty concentrating or making decisions? N  Walking or climbing stairs? N  Dressing or bathing? N  Doing errands, shopping? N  Preparing Food and eating ? N  Using the Toilet? N  In the past six months, have you accidently leaked urine? N  Do you have problems with loss of bowel control? N  Managing your Medications? N  Managing your Finances? N  Housekeeping or managing your Housekeeping? N  Some recent data might be hidden    Patient Care Team: Judy PimpleMarne A Tower, MD as PCP - General (Family Medicine) [place] Chardonika K Patel, DO as Consulting Physician (Neurology) Stephannie LiJason Sanders, MD as Consulting Physician (Ophthalmology)    Assessment:     Hearing Screening   125Hz  250Hz  500Hz  1000Hz  2000Hz  3000Hz  4000Hz  6000Hz  8000Hz   Right ear:   0 0 0  0    Left ear:   0 0 0  0    V[facility] Screening Comments: [facility]   Exercise Activities and Dietary recommendations Current Exercise Habits: The patient does not  participate in regular exercise at present, Exercise limited by: None identified  Goals    . reduce fall risk          Starting 01/02/2016, I will continue to use cane as needed to assist with balance in an effort to reduce risk of falls.       Fall Risk Fall Risk  01/02/2016 07/27/2015 03/18/2014  Falls in the past year? No No Yes  Number falls in past yr: - - 2 or more   Depression Screen PHQ 2/9 Scores 01/02/2016 09/27/2014 03/18/2014 03/17/2013  PHQ - 2 Score 1 5 1  0  PHQ- 9 Score - 13 - -     Cognitive Function MMSE - Mini Mental State Exam 01/02/2016  Orientation to time 5  Orientation to Place 5  Registration 3  Attention/ Calculation 0  Recall 3  Language- name 2 objects 0  Language- repeat 1  Language- follow 3 step command 3  Language- read & follow direction 0  Write a sentence 0  Copy design 0  Total score 20     PLEASE NOTE: A Mini-Cog screen was completed. Maximum score is 20. A value of 0 denotes this part of Folstein MMSE was not completed or the patient failed this part of the Mini-Cog screening.   Mini-Cog Screening Orientation to Time - Max 5 pts Orientation to Place - Max 5 pts Registration - Max 3 pts Recall - Max 3 pts Language Repeat - Max 1 pts Language Follow 3 Step Command - Max 3 pts     Immunization History  Administered Date(s) Administered  . Influenza Split 11/18/2011  . Influenza Whole 11/10/2007, 11/30/2008, 11/07/2009  . Influenza,inj,Quad PF,36+ Mos 11/12/2012, 11/05/2013, 12/06/2014, 01/02/2016  . Pneumococcal Conjugate-13 03/18/2014  . Pneumococcal Polysaccharide-23 11/18/2011   Screening Tests Health Maintenance  Topic Date Due  . ZOSTAVAX  05/20/2020 (Originally 03/11/1992)  . MAMMOGRAM  01/01/2026 (Originally 02/22/2015)  . TETANUS/TDAP  01/01/2026 (Originally 03/12/1949)  . INFLUENZA VACCINE  Completed  . DEXA SCAN  Completed  . PNA vac Low Risk Adult  Completed      Plan:     I have personally reviewed and addressed  the Medicare Annual Wellness questionnaire and have noted the following in the patient's chart:  A. Medical and social history B. Use of alcohol, tobacco or illicit drugs  C. Current medications and supplements D. Functional ability and status E.  Nutritional status F.  Physical activity G. Advance directives H. List of other physicians I.  Hospitalizations, surgeries, and ER visits in previous 12 months J.  Vitals K. Screenings  to include hearing, vision, cognitive, depression L. Referrals and appointments - none  In addition, I have reviewed and discussed with patient certain preventive protocols, quality metrics, and best practice recommendations. A written personalized care plan for preventive services as well as general preventive health recommendations were provided to patient.  See attached scanned questionnaire for additional information.   Signed,   Lindell Noe, MHA, BS, LPN Health Coach

## 2016-01-02 NOTE — Progress Notes (Signed)
Pre visit review using our clinic review tool, if applicable. No additional management support is needed unless otherwise documented below in the visit note. 

## 2016-01-02 NOTE — Patient Instructions (Signed)
[name] , Thank you for taking time to come for your Medicare Wellness Visit. I appreciate your ongoing commitment to your health goals. Please review the following plan we discussed and let me know if I can assist you in the future.   These are the goals we discussed: Goals    . reduce fall risk          Starting 01/02/2016, I will continue to use cane as needed to assist with balance in an effort to reduce risk of falls.        This is a list of the screening recommended for you and due dates:  Health Maintenance  Topic Date Due  . Shingles Vaccine  05/20/2020*  . Mammogram  01/01/2026*  . Tetanus Vaccine  01/01/2026*  . Flu Shot  Completed  . DEXA scan (bone density measurement)  Completed  . Pneumonia vaccines  Completed  *Topic was postponed. The date shown is not the original due date.   Preventive Care for Adults  A healthy lifestyle and preventive care can promote health and wellness. Preventive health guidelines for adults include the following key practices.  . A routine yearly physical is a good way to check with your health care provider about your health and preventive screening. It is a chance to share any concerns and updates on your health and to receive a thorough exam.  . Visit your dentist for a routine exam and preventive care every 6 months. Brush your teeth twice a day and floss once a day. Good oral hygiene prevents tooth decay and gum disease.  . The frequency of eye exams is based on your age, health, family medical history, use  of contact lenses, and other factors. Follow your health care provider's ecommendations for frequency of eye exams.  . Eat a healthy diet. Foods like vegetables, fruits, whole grains, low-fat dairy products, and lean protein foods contain the nutrients you need without too many calories. Decrease your intake of foods high in solid fats, added sugars, and salt. Eat the right amount of calories for you. Get information about a  proper diet from your health care provider, if necessary.  . Regular physical exercise is one of the most important things you can do for your health. Most adults should get at least 150 minutes of moderate-intensity exercise (any activity that increases your heart rate and causes you to sweat) each week. In addition, most adults need muscle-strengthening exercises on 2 or more days a week.  Silver Sneakers may be a benefit available to you. To determine eligibility, you may visit the website: www.silversneakers.com or contact program at [phone] Mon-Fri between 8AM-8PM.   . Maintain a healthy weight. The body mass index (BMI) is a screening tool to identify possible weight problems. It provides an estimate of body fat based on height and weight. Your health care provider can find your BMI and can help you achieve or maintain a healthy weight.   For adults 20 years and older: ? A BMI below 18.5 is considered underweight. ? A BMI of 18.5 to 24.9 is normal. ? A BMI of 25 to 29.9 is considered overweight. ? A BMI of 30 and above is considered obese.   . Maintain normal blood lipids and cholesterol levels by exercising and minimizing your intake of saturated fat. Eat a balanced diet with plenty of fruit and vegetables. Blood tests for lipids and cholesterol should begin at age 520 and be repeated every 5 years. If your lipid  or cholesterol levels are high, you are over 50, or you are at high risk for heart disease, you may need your cholesterol levels checked more frequently. Ongoing high lipid and cholesterol levels should be treated with medicines if diet and exercise are not working.  . If you smoke, find out from your health care provider how to quit. If you do not use tobacco, please do not start.  . If you choose to drink alcohol, please do not consume more than 2 drinks per day. One drink is considered to be 12 ounces (355 mL) of beer, 5 ounces (148 mL) of wine, or 1.5 ounces (44 mL) of  liquor.  . If you are 1-65 years old, ask your health care provider if you should take aspirin to prevent strokes.  . Use sunscreen. Apply sunscreen liberally and repeatedly throughout the day. You should seek shade when your shadow is shorter than you. Protect yourself by wearing long sleeves, pants, a wide-brimmed hat, and sunglasses year round, whenever you are outdoors.  . Once a month, do a whole body skin exam, using a mirror to look at the skin on your back. Tell your health care provider of new moles, moles that have irregular borders, moles that are larger than a pencil eraser, or moles that have changed in shape or color.

## 2016-01-02 NOTE — Progress Notes (Signed)
PCP notes:   Health maintenance:  Flu vaccine - administered Mammogram- pt declined to do anymore screenings Tetanus - postponed/insurance  Abnormal screenings:   Hearing - failed  Patient concerns:   Pt requested B12 injection. PCP notified. Verbal order given to administer injection.   Pt wants to discuss with PCP an order for an elevated toilet seat.   Pt reports increased episodes of dizziness upon waking and when going to bed.   Nurse concerns:  None  I reviewed health advisor's note, was available for consultation, and agree with documentation and plan. Roxy MannsMarne Tower MD   Next PCP appt:   01/09/16 @ 1430  I reviewed health advisor's note, was available for consultation, and agree with documentation and plan. Roxy MannsMarne Tower MD

## 2016-01-09 ENCOUNTER — Encounter: Payer: Self-pay | Admitting: Family Medicine

## 2016-01-09 ENCOUNTER — Ambulatory Visit (INDEPENDENT_AMBULATORY_CARE_PROVIDER_SITE_OTHER): Payer: Medicare Other | Admitting: Family Medicine

## 2016-01-09 VITALS — BP 106/62 | HR 98 | Temp 98.1°F | Ht 63.0 in | Wt 164.0 lb

## 2016-01-09 DIAGNOSIS — H9193 Unspecified hearing loss, bilateral: Secondary | ICD-10-CM

## 2016-01-09 DIAGNOSIS — I10 Essential (primary) hypertension: Secondary | ICD-10-CM

## 2016-01-09 DIAGNOSIS — M8588 Other specified disorders of bone density and structure, other site: Secondary | ICD-10-CM | POA: Diagnosis not present

## 2016-01-09 DIAGNOSIS — E538 Deficiency of other specified B group vitamins: Secondary | ICD-10-CM

## 2016-01-09 DIAGNOSIS — Z1211 Encounter for screening for malignant neoplasm of colon: Secondary | ICD-10-CM

## 2016-01-09 DIAGNOSIS — M546 Pain in thoracic spine: Secondary | ICD-10-CM

## 2016-01-09 DIAGNOSIS — H9190 Unspecified hearing loss, unspecified ear: Secondary | ICD-10-CM | POA: Insufficient documentation

## 2016-01-09 DIAGNOSIS — E7800 Pure hypercholesterolemia, unspecified: Secondary | ICD-10-CM

## 2016-01-09 DIAGNOSIS — E213 Hyperparathyroidism, unspecified: Secondary | ICD-10-CM | POA: Diagnosis not present

## 2016-01-09 DIAGNOSIS — Z0000 Encounter for general adult medical examination without abnormal findings: Secondary | ICD-10-CM | POA: Diagnosis not present

## 2016-01-09 DIAGNOSIS — G8929 Other chronic pain: Secondary | ICD-10-CM

## 2016-01-09 MED ORDER — LOSARTAN POTASSIUM 100 MG PO TABS: 50.0000 mg | ORAL_TABLET | Freq: Every day | ORAL | 11 refills | Status: DC

## 2016-01-09 MED ORDER — HYDROCHLOROTHIAZIDE 25 MG PO TABS: 25.0000 mg | ORAL_TABLET | Freq: Every day | ORAL | 3 refills | Status: DC

## 2016-01-09 MED ORDER — CLOPIDOGREL BISULFATE 75 MG PO TABS: 75.0000 mg | ORAL_TABLET | Freq: Every day | ORAL | 3 refills | Status: DC

## 2016-01-09 MED ORDER — SERTRALINE HCL 25 MG PO TABS: 50.0000 mg | ORAL_TABLET | Freq: Every day | ORAL | 3 refills | Status: DC

## 2016-01-09 NOTE — Progress Notes (Signed)
Pre visit review using our clinic review tool, if applicable. No additional management support is needed unless otherwise documented below in the visit note. 

## 2016-01-09 NOTE — Patient Instructions (Addendum)
Stop at check out for referral to a hearing specialist  Cut your losartan 100 mg in half and just take a half pill daily - I think your blood pressure may be too low at times (lets see if this helps your dizziness)  Buy extra vitamin D 2000 iu daily - take it every day in addition to the calcium and D that  You already take   You need to eat some protein with each meal- either meat or fish or nuts or dairy products or beans or soy products - to keep your muscles strong   Here is a px for an elevated toilet seat  Take it to any medical supply company and they should be able to work on getting one for you

## 2016-01-09 NOTE — Progress Notes (Signed)
Subjective:    Patient ID: [name], female    DOB: 14-Sep-1930, 82 y.o.   MRN: [id number]  HPI Here for health maintenance exam and to review chronic medical problems    Pt had AMW with Virl Axe on 11/21 Had her flu vaccine   Declines mammograms (nl in 2016) Self exam-no lumps  No longer wants to screen   Failed hearing screen  She would consider hearing aides if they were not so $$ She had them once and one broke-did not go back  Is interested in audiology eval   Had her B12 shot   req elevated toilet seat to prevent falls  Has a problem getting on and off the commode  She has to push with her hands and has wrist and back pain   Wt Readings from Last 3 Encounters:  01/09/16 164 lb (74.4 kg)  01/02/16 164 lb 4 oz (74.5 kg)  12/26/15 166 lb 8 oz (75.5 kg)   bmi is 29.05  dexa 4/13- osteopenia  No falls  Declines further bone density tests  Vit D level is low at 26.4 Was supposed to increase her D by 2000 iu daily last year- does not think she did it   ifob 4/15 neg She declines further colon cancer screening   bp is stable today  No cp or palpitations or headaches or edema  No side effects to medicines  BP Readings from Last 3 Encounters:  01/09/16 106/62  01/02/16 110/70  12/26/15 (!) 136/97    She is dizzy when she gets up in the am  Is interested in cutting back her bp med   Hx of hyperparathyroidism Lab Results  Component Value Date   CALCIUM 10.3 01/02/2016   PHOS 2.9 05/05/2012     Hx of hyperlipidemia Lab Results  Component Value Date   CHOL 213 (H) 01/02/2016   CHOL 180 03/16/2014   CHOL 220 (H) 03/17/2013   Lab Results  Component Value Date   HDL 86.70 01/02/2016   HDL 72.10 03/16/2014   HDL 83.20 03/17/2013   Lab Results  Component Value Date   LDLCALC 115 (H) 01/02/2016   LDLCALC 83 03/16/2014   LDLCALC 48 03/03/2012   Lab Results  Component Value Date   TRIG 60.0 01/02/2016   TRIG 123.0 03/16/2014   TRIG 80.0 03/17/2013    Lab Results  Component Value Date   CHOLHDL 2 01/02/2016   CHOLHDL 2 03/16/2014   CHOLHDL 3 03/17/2013   Lab Results  Component Value Date   LDLDIRECT 121.0 03/17/2013   LDLDIRECT 122.1 05/21/2011   LDLDIRECT 132.0 11/07/2009   diet controlled  LDL is up from last check - LDL is up to 115   Gets B12 shots every 2 mon Lab Results  Component Value Date   VITAMINB12 >1500 (H) 01/02/2016   Glucose 102  Will watch the sweets over the holidays   Does not eat regularly  ? If she gets protein   Patient Active Problem List   Diagnosis Date Noted  . Hearing loss 01/09/2016  . Routine general medical examination at a health care facility 12/31/2015  . Light headed 09/29/2015  . Dysuria 09/29/2015  . Anxiety and depression 09/21/2014  . Pedal edema 06/22/2014  . Encounter for Medicare annual wellness exam 03/18/2014  . Retinal vein occlusion 03/18/2014  . B12 deficiency 03/15/2014  . Colon cancer screening 04/19/2013  . At risk for falling 12/02/2011  . Hyperparathyroidism (HCC) 11/20/2011  . Back  pain, thoracic 09/30/2011  . Scoliosis 09/30/2011  . Degenerative disc disease, lumbar 09/30/2011  . Renal insufficiency 05/27/2011  . Syncope 12/05/2010  . CVA 02/26/2008  . Hyperlipidemia 05/12/2006  . Essential hypertension 05/12/2006  . DIVERTICULOSIS, COLON 05/12/2006  . Osteopenia 05/12/2006   Past Medical History:  Diagnosis Date  . Chronic pain   . DDD (degenerative disc disease)   . Diverticulosis   . Hyperlipidemia   . Hypertension   . Osteopenia   . Stroke (HCC)   . Syncope    Past Surgical History:  Procedure Laterality Date  . ABDOMINAL HYSTERECTOMY    . carotid doppler     normal  . DOPPLER ECHOCARDIOGRAPHY     normal  . EXPLORATORY LAPAROTOMY     2 times both neg   . LAMINECTOMY  06-2003   l2-l4  . SPINE SURGERY     l3-l4 microdiscectomy   . TONSILLECTOMY     Social History  Substance Use Topics  . Smoking status: Never Smoker  . Smokeless  tobacco: Never Used  . Alcohol use No   Family History  Problem Relation Age of Onset  . Hypertension Father   . COPD Father   . Other Mother     Deceased, 3694  . Healthy Brother    Allergies  Allergen Reactions  . Celecoxib Other (See Comments)    affects kidneys  . Codeine Nausea Only and Other (See Comments)    dizziness  . Lisinopril Cough  . Etodolac Rash   Current Outpatient Prescriptions on File Prior to Visit  Medication Sig Dispense Refill  . Ascorbic Acid (VITAMIN C PO) Take 1 tablet by mouth.    . Cholecalciferol (VITAMIN D PO) Take 1 tablet by mouth daily.    . Cyanocobalamin (VITAMIN B-12 IJ) Inject as directed See admin instructions. Inject every 2 months - last injection August 2016    . fish oil-omega-3 fatty acids 1000 MG capsule Take 1 g by mouth daily.    Marland Kitchen. gabapentin (NEURONTIN) 300 MG capsule Take 1 capsule (300 mg total) by mouth at bedtime. 90 capsule 3  . timolol (TIMOPTIC) 0.5 % ophthalmic solution Place 1 drop into the right eye 2 (two) times daily.  3   No current facility-administered medications on file prior to visit.     Review of Systems    Review of Systems  Constitutional: Negative for fever, appetite change, fatigue and unexpected weight change.  Eyes: Negative for pain and visual disturbance.  Respiratory: Negative for cough and shortness of breath.   Cardiovascular: Negative for cp or palpitations    Gastrointestinal: Negative for nausea, diarrhea and constipation.  Genitourinary: Negative for urgency and frequency.  Skin: Negative for pallor or rash   MSK pos for back pain and impaired mobility Neurological: Negative for weakness, light-headedness, numbness and headaches.  Hematological: Negative for adenopathy. Does not bruise/bleed easily.  Psychiatric/Behavioral: Negative for dysphoric mood. The patient is not nervous/anxious.      Objective:   Physical Exam  Constitutional: She appears well-developed and well-nourished. No  distress.  overwt and somewhat frail appearing  HENT:  Head: Normocephalic and atraumatic.  Right Ear: External ear normal.  Left Ear: External ear normal.  Mouth/Throat: Oropharynx is clear and moist.  Very poor hearing  Hard to comm with  Eyes: Conjunctivae and EOM are normal. Pupils are equal, round, and reactive to light. No scleral icterus.  Neck: Normal range of motion. Neck supple. No JVD present. Carotid bruit is not  present. No thyromegaly present.  Cardiovascular: Normal rate, regular rhythm, normal heart sounds and intact distal pulses.  Exam reveals no gallop.   Pulmonary/Chest: Effort normal and breath sounds normal. No respiratory distress. She has no wheezes. She exhibits no tenderness.  Abdominal: Soft. Bowel sounds are normal. She exhibits no distension, no abdominal bruit and no mass. There is no tenderness.  Genitourinary: No breast swelling, tenderness, discharge or bleeding.  Genitourinary Comments: Breast exam: No mass, nodules, thickening, tenderness, bulging, retraction, inflamation, nipple discharge or skin changes noted.  No axillary or clavicular LA.      Musculoskeletal: Normal range of motion. She exhibits no edema or tenderness.  Lymphadenopathy:    She has no cervical adenopathy.  Neurological: She is alert. She has normal reflexes. No cranial nerve deficit. She exhibits normal muscle tone. Coordination normal.  Gait is very slow but steady  Skin: Skin is warm and dry. No rash noted. No erythema. No pallor.  Fair complexion  Few SK  Psychiatric: She has a normal mood and affect.          Assessment & Plan:   Problem List Items Addressed This Visit      Cardiovascular and Mediastinum   Essential hypertension - Primary    bp is on the low side and pt c/o lightheadedness in the am Will cut losartan to 50 mg daily and follow closely      Relevant Medications   losartan (COZAAR) 100 MG tablet   hydrochlorothiazide (HYDRODIURIL) 25 MG tablet      Endocrine   Hyperparathyroidism (HCC) (Chronic)    Stable ca  Declines dexa  Continue to watch        Nervous and Auditory   Hearing loss    Ref to audiology-she is willing to try hearing aides again  Hearing is very bad       Relevant Orders   Ambulatory referral to Audiology     Musculoskeletal and Integument   Osteopenia    Declines dexa or tx  Enc strongly that she take her ca and D Stressed imp of fall prev          Other   Routine general medical examination at a health care facility    Reviewed health habits including diet and exercise and skin cancer prevention Reviewed appropriate screening tests for age  Also reviewed health mt list, fam hx and immunization status , as well as social and family history   See HPI Labs reviewed AMW rev  Ref for hearing eval  Px for elevated toilet seat  Declines cancer screening  Declines dexa or OP tx  Disc fall prev  Stop at check out for referral to a hearing specialist  Cut your losartan 100 mg in half and just take a half pill daily - I think your blood pressure may be too low at times (lets see if this helps your dizziness)  Buy extra vitamin D 2000 iu daily - take it every day in addition to the calcium and D that  You already take   You need to eat some protein with each meal- either meat or fish or nuts or dairy products or beans or soy products - to keep your muscles strong   Here is a px for an elevated toilet seat  Take it to any medical supply company and they should be able to work on getting one for you       Hyperlipidemia    Disc goals for lipids  and reasons to control them Rev labs with pt Rev low sat fat diet in detail  LDL is above goal with great HDL      Relevant Medications   losartan (COZAAR) 100 MG tablet   hydrochlorothiazide (HYDRODIURIL) 25 MG tablet   Colon cancer screening    Declines further due to age      Back pain, thoracic    Ongoing  Scoliosis /disc dz/ op Pt req elevated  toilet seat to help her get up and down (needs assistance)  Declines dexa       B12 deficiency    Gets inj every 2 mo  Doubt diet is balanced  Level is high

## 2016-01-10 NOTE — Assessment & Plan Note (Signed)
Reviewed health habits including diet and exercise and skin cancer prevention Reviewed appropriate screening tests for age  Also reviewed health mt list, fam hx and immunization status , as well as social and family history   See HPI Labs reviewed AMW rev  Ref for hearing eval  Px for elevated toilet seat  Declines cancer screening  Declines dexa or OP tx  Disc fall prev  Stop at check out for referral to a hearing specialist  Cut your losartan 100 mg in half and just take a half pill daily - I think your blood pressure may be too low at times (lets see if this helps your dizziness)  Buy extra vitamin D 2000 iu daily - take it every day in addition to the calcium and D that  You already take   You need to eat some protein with each meal- either meat or fish or nuts or dairy products or beans or soy products - to keep your muscles strong   Here is a px for an elevated toilet seat  Take it to any medical supply company and they should be able to work on getting one for you

## 2016-01-10 NOTE — Assessment & Plan Note (Signed)
Gets inj every 2 mo  Doubt diet is balanced  Level is high

## 2016-01-10 NOTE — Assessment & Plan Note (Signed)
bp is on the low side and pt c/o lightheadedness in the am Will cut losartan to 50 mg daily and follow closely

## 2016-01-10 NOTE — Assessment & Plan Note (Signed)
Declines dexa or tx  Enc strongly that she take her ca and D Stressed imp of fall prev

## 2016-01-10 NOTE — Assessment & Plan Note (Signed)
Disc goals for lipids and reasons to control them Rev labs with pt Rev low sat fat diet in detail  LDL is above goal with great HDL

## 2016-01-10 NOTE — Assessment & Plan Note (Signed)
Ongoing  Scoliosis /disc dz/ op Pt req elevated toilet seat to help her get up and down (needs assistance)  Declines dexa

## 2016-01-10 NOTE — Assessment & Plan Note (Signed)
Declines further due to age

## 2016-01-10 NOTE — Assessment & Plan Note (Signed)
Ref to audiology-she is willing to try hearing aides again  Hearing is very bad

## 2016-01-10 NOTE — Assessment & Plan Note (Signed)
Stable ca  Declines dexa  Continue to watch

## 2016-01-12 DIAGNOSIS — R05 Cough: Secondary | ICD-10-CM | POA: Diagnosis not present

## 2016-01-12 DIAGNOSIS — J209 Acute bronchitis, unspecified: Secondary | ICD-10-CM | POA: Diagnosis not present

## 2016-01-18 DIAGNOSIS — H903 Sensorineural hearing loss, bilateral: Secondary | ICD-10-CM | POA: Diagnosis not present

## 2016-02-21 DIAGNOSIS — H348112 Central retinal vein occlusion, right eye, stable: Secondary | ICD-10-CM | POA: Diagnosis not present

## 2016-02-21 DIAGNOSIS — H211X1 Other vascular disorders of iris and ciliary body, right eye: Secondary | ICD-10-CM | POA: Diagnosis not present

## 2016-02-21 DIAGNOSIS — H4051X2 Glaucoma secondary to other eye disorders, right eye, moderate stage: Secondary | ICD-10-CM | POA: Diagnosis not present

## 2016-05-06 ENCOUNTER — Other Ambulatory Visit: Payer: Self-pay | Admitting: Neurology

## 2016-06-26 DIAGNOSIS — H348112 Central retinal vein occlusion, right eye, stable: Secondary | ICD-10-CM | POA: Diagnosis not present

## 2016-07-01 DIAGNOSIS — H26493 Other secondary cataract, bilateral: Secondary | ICD-10-CM | POA: Diagnosis not present

## 2016-07-01 DIAGNOSIS — H4051X4 Glaucoma secondary to other eye disorders, right eye, indeterminate stage: Secondary | ICD-10-CM | POA: Diagnosis not present

## 2016-07-01 DIAGNOSIS — H348111 Central retinal vein occlusion, right eye, with retinal neovascularization: Secondary | ICD-10-CM | POA: Diagnosis not present

## 2016-07-25 NOTE — Progress Notes (Signed)
Follow-up Visit   Date: 07/26/16    CRIS GIBBY MRN: [id number] DOB: March 18, 1930   Interim History: [name] is a 83 y.o. right-handed Caucasian female with history of hypertension, stroke (2013, manifesting with dysarthria and left homonymous hemianopia, no residual symptoms), hypertension, and hyperlipidemia returning to the clinic for follow-up of hand paresthesias.  The patient was accompanied to the clinic by niece (Tammy) who also provides collateral information.    History of present illness: Around May 2015, she developed numbness and tingling of the fingertips which initially started on the left hand and then involved the right. Symptoms are worse in the morning and improves as the day goes on. She denies any exacerbating/alleviating factors. She denies any weakness of the hands or neck pain. No numbness/tingling of the feet. She has difficulty with walking and loss of balance, especially when on uneven surfaces.  No recent falls because she is extra cautious.  NCS/EMG of the upper extremities was normal.   She is taking both gabapentin 300mg  and nortriptyline 10mg , but was supposed to stop gabapentin due to ?drug-related rash, but forgot to do.   UPDATE 07/25/2014:  She is here for 22-month return visit.  Hand paresthesias are somewhat better than the last time.  She is most bothered by her leg swelling and feels that gabapentin may be causing this.  She is also having injections for retinopathy.  She continues to get vitamin B12 injections every other month and feels much better when she takes this.  She has not had any falls since her last visit and ambulates with a cane which supports her.   UPDATE 07/27/2015:  She has no new complaints today.  No interval falls.  She had one ED visit for kidney stone, which unfortunately still has not passed.  Otherwise, she is doing great.  Her niece does not have any concerns with home safety or driving.  UPDATE 07/25/2016:  Patient is  here with her nephew for 1 year follow-up visit.  She denies any new neurological complaints.  Hand paresthesias are well-controlled on gabapentin 300mg  at bedtime.  Her nephew had noticed some concerns with her driving and that she does not wear her hearing aides, which makes communication difficult.  She usually only drives to church and the grocery store which is closeby. She continues to live independently, drive, and manage finances.  No interval falls or hospitalizations.  Medications:  Current Outpatient Prescriptions on File Prior to Visit  Medication Sig Dispense Refill  . Ascorbic Acid (VITAMIN C PO) Take 1 tablet by mouth.    . Cholecalciferol (VITAMIN D PO) Take 1 tablet by mouth daily.    . clopidogrel (PLAVIX) 75 MG tablet Take 1 tablet (75 mg total) by mouth daily. 90 tablet 3  . Cyanocobalamin (VITAMIN B-12 IJ) Inject as directed See admin instructions. Inject every 2 months - last injection August 2016    . fish oil-omega-3 fatty acids 1000 MG capsule Take 1 g by mouth daily.    . hydrochlorothiazide (HYDRODIURIL) 25 MG tablet Take 1 tablet (25 mg total) by mouth daily. 90 tablet 3  . losartan (COZAAR) 100 MG tablet Take 0.5 tablets (50 mg total) by mouth daily. 15 tablet 11  . sertraline (ZOLOFT) 25 MG tablet Take 2 tablets (50 mg total) by mouth daily. 180 tablet 3  . timolol (TIMOPTIC) 0.5 % ophthalmic solution Place 1 drop into the right eye 2 (two) times daily.  3   No current facility-administered  medications on file prior to visit.     Allergies:  Allergies  Allergen Reactions  . Celecoxib Other (See Comments)    affects kidneys  . Codeine Nausea Only and Other (See Comments)    dizziness  . Lisinopril Cough  . Etodolac Rash     Review of Systems:  CONSTITUTIONAL: No fevers, chills, night sweats, or weight loss.   EYES: No visual changes or eye pain ENT: No hearing changes.  No history of nose bleeds.   RESPIRATORY: No cough, wheezing and shortness of breath.    CARDIOVASCULAR: Negative for chest pain, and palpitations.   GI: Negative for abdominal discomfort, blood in stools or black stools.  No recent change in bowel habits.   GU:  No history of incontinence.   MUSCLOSKELETAL: No history of joint pain or swelling.  No myalgias.   SKIN: Negative for lesions,+ rash, and itching.   ENDOCRINE: Negative for cold or heat intolerance, polydipsia or goiter.   PSYCH:  No depression or anxiety symptoms.   NEURO: As Above.   Vital Signs:  BP 120/80   Pulse 70   Ht 5\' 3"  (1.6 m)   Wt 168 lb 4 oz (76.3 kg)   SpO2 97%   BMI 29.80 kg/m   Neurological Exam: MENTAL STATUS including orientation to time, place, person, recent and remote memory, attention span and concentration, language, and fund of knowledge is fair, somewhat limited by her hearing deficits.  Speech is not dysarthric.  Montreal Cognitive Assessment  07/26/2016  Visuospatial/ Executive (0/5) 2  Naming (0/3) 3  Attention: Read list of digits (0/2) 2  Attention: Read list of letters (0/1) 1  Attention: Serial 7 subtraction starting at 100 (0/3) 0  Language: Repeat phrase (0/2) 1  Language : Fluency (0/1) 0  Abstraction (0/2) 1  Delayed Recall (0/5) 1  Orientation (0/6) 6  Total 17  Adjusted Score (based on education) 18   CRANIAL NERVES:   Face is symmetric.  Poor hearing bilaterally.  MOTOR:  Motor strength is 5/5 in all extremities, except bilateral ABP 5-/5.    MSRs:  Reflexes are 2+/4 in the upper extremities and 1+ in the lower extremities throughout.  COORDINATION/GAIT: Gait appears ataxic and unsteady, unassisted   Data: EMG of the upper extremities 09/15/2013: This is a normal study of the right and left upper extremities. In particular, there is no evidence of generalized sensorimotor polyneuropathy, cervical radiculopathy, or carpal tunnel syndrome.  MRI brain wo contrast 11/17/2011:  No acute or subacute infarction.  Old inferior cerebellar infarction on the left.    Chronic small vessel changes of the pons in the cerebral hemispheric white matter, fairly typical for age.   MRA head 11/17/2011:  No major vessel occlusion.  No significant anterior circulation stenotic disease.  40% stenosis of the basilar artery in the proximal third.  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    IMPRESSION/PLAN:   Moderate cognitive impairment based on [facility] 18/30 Patient still lives independently and manages her own ADLs and IADLs I recommend that she restrict drive to local distances and avoid high traffic times/regions as well as nighttime driving.   Discussed adding anticholinesterase inhibitor therapy, but it was declined.  If there is progressive changes, would recommend starting this Her niece, Babette Relic, is her POA.  Recommend that there is more oversight of her financial and personal affairs Strongly encouraged her to wear hearing aides  Paresthesias of the hands - stable  on gabapentin 300mg  at bedtime.  Etiology is uncertain (? Low B12), as there is no neuropathy, CTS, or radiculopathy on EMG.    Vitamin B12 deficiency, continue vitamin B12 1000mcg supplementation  Large fiber neuropathy affecting the feet, likely idiopathic, and contributing to sensory ataxia/gait instability  - stable Stressed the importance of using a cane  History of left posterior frontal and parietal infarction (2010), no residual deficits  Continue plavix and secondary risk prevention   Right cavernous carotid 4mm aneurysm Check MRA head for annual surveillance  Return to clinic in 1 year   The duration of this appointment visit was 40 minutes of face-to-face time with the patient.  Greater than 50% of this time was spent in counseling, explanation of diagnosis, planning of further management, and coordination of care.   Thank you for allowing me to participate in patient's care.  If I can answer any additional questions, I would be pleased  to do so.    Sincerely,    Sharnetta Gielow K. Allena KatzPatel, DO

## 2016-07-26 ENCOUNTER — Encounter: Payer: Self-pay | Admitting: Neurology

## 2016-07-26 ENCOUNTER — Ambulatory Visit (INDEPENDENT_AMBULATORY_CARE_PROVIDER_SITE_OTHER): Payer: Medicare Other | Admitting: Neurology

## 2016-07-26 VITALS — BP 120/80 | HR 70 | Ht 63.0 in | Wt 168.2 lb

## 2016-07-26 DIAGNOSIS — M79602 Pain in left arm: Secondary | ICD-10-CM

## 2016-07-26 DIAGNOSIS — I671 Cerebral aneurysm, nonruptured: Secondary | ICD-10-CM | POA: Diagnosis not present

## 2016-07-26 DIAGNOSIS — R4189 Other symptoms and signs involving cognitive functions and awareness: Secondary | ICD-10-CM | POA: Diagnosis not present

## 2016-07-26 DIAGNOSIS — G629 Polyneuropathy, unspecified: Secondary | ICD-10-CM

## 2016-07-26 DIAGNOSIS — R202 Paresthesia of skin: Secondary | ICD-10-CM | POA: Diagnosis not present

## 2016-07-26 DIAGNOSIS — M79601 Pain in right arm: Secondary | ICD-10-CM | POA: Diagnosis not present

## 2016-07-26 MED ORDER — GABAPENTIN 300 MG PO CAPS: ORAL_CAPSULE | ORAL | 3 refills | Status: DC

## 2016-07-26 NOTE — Patient Instructions (Addendum)
Check MRA head.  If this is stable, we will recheck in 2 years  Continue gabapentin 300mg  at bedtime  Please restrict driving to local distances only. Avoid nighttime driving and high traffic areas.  Start wearing your hearing aides  If you develop worsening memory changes, we may need to do additional cognitive testing  Return to clinic in 1 year

## 2016-07-30 DIAGNOSIS — H26491 Other secondary cataract, right eye: Secondary | ICD-10-CM | POA: Diagnosis not present

## 2016-07-30 DIAGNOSIS — Z961 Presence of intraocular lens: Secondary | ICD-10-CM | POA: Diagnosis not present

## 2016-07-30 DIAGNOSIS — H18413 Arcus senilis, bilateral: Secondary | ICD-10-CM | POA: Diagnosis not present

## 2016-07-30 DIAGNOSIS — H26492 Other secondary cataract, left eye: Secondary | ICD-10-CM | POA: Diagnosis not present

## 2016-08-12 ENCOUNTER — Ambulatory Visit
Admission: RE | Admit: 2016-08-12 | Discharge: 2016-08-12 | Disposition: A | Discharge status: Home / Self Care | Payer: Medicare Other | Source: Ambulatory Visit | Attending: Neurology | Admitting: Neurology

## 2016-08-12 DIAGNOSIS — I671 Cerebral aneurysm, nonruptured: Secondary | ICD-10-CM | POA: Diagnosis not present

## 2016-08-13 ENCOUNTER — Telehealth: Payer: Self-pay | Admitting: *Deleted

## 2016-08-13 NOTE — Telephone Encounter (Signed)
-----   Message from [place] Chardonika K Patel, DO sent at 08/12/2016  4:16 PM EDT ----- Please inform patient that her aneurysm is stable.  Recheck in 2 years. Thanks.

## 2016-08-13 NOTE — Telephone Encounter (Signed)
Patient given results

## 2016-10-16 ENCOUNTER — Telehealth: Payer: Self-pay | Admitting: Family Medicine

## 2016-10-16 ENCOUNTER — Ambulatory Visit (INDEPENDENT_AMBULATORY_CARE_PROVIDER_SITE_OTHER): Payer: Medicare Other

## 2016-10-16 DIAGNOSIS — Z23 Encounter for immunization: Secondary | ICD-10-CM | POA: Diagnosis not present

## 2016-10-16 DIAGNOSIS — E538 Deficiency of other specified B group vitamins: Secondary | ICD-10-CM | POA: Diagnosis not present

## 2016-10-16 MED ORDER — CYANOCOBALAMIN 1000 MCG/ML IJ SOLN
1000.0000 ug | Freq: Once | INTRAMUSCULAR | Status: AC
  Administered 2016-10-16: 1000 ug via INTRAMUSCULAR

## 2016-10-16 NOTE — Telephone Encounter (Signed)
Pt dropped off [employer] disability parking placard form to be filled out. I placed in Rx tower. She is requesting a call when ready for p/u.

## 2016-10-17 NOTE — Telephone Encounter (Signed)
Forms is in your inbox

## 2016-10-20 NOTE — Telephone Encounter (Signed)
Done and in IN box 

## 2016-10-21 NOTE — Telephone Encounter (Signed)
Left voicemail letting pt know form ready for pick up 

## 2016-12-30 ENCOUNTER — Ambulatory Visit: Payer: Self-pay | Admitting: *Deleted

## 2016-12-30 ENCOUNTER — Emergency Department
Admission: EM | Admit: 2016-12-30 | Discharge: 2016-12-30 | Disposition: A | Discharge status: Home / Self Care | Payer: Medicare Other | Attending: Emergency Medicine | Admitting: Emergency Medicine

## 2016-12-30 ENCOUNTER — Encounter: Payer: Self-pay | Admitting: Emergency Medicine

## 2016-12-30 ENCOUNTER — Other Ambulatory Visit: Payer: Self-pay

## 2016-12-30 ENCOUNTER — Emergency Department: Payer: Medicare Other

## 2016-12-30 DIAGNOSIS — I639 Cerebral infarction, unspecified: Secondary | ICD-10-CM | POA: Diagnosis not present

## 2016-12-30 DIAGNOSIS — R202 Paresthesia of skin: Secondary | ICD-10-CM | POA: Diagnosis present

## 2016-12-30 DIAGNOSIS — Z7901 Long term (current) use of anticoagulants: Secondary | ICD-10-CM | POA: Diagnosis not present

## 2016-12-30 DIAGNOSIS — I10 Essential (primary) hypertension: Secondary | ICD-10-CM | POA: Diagnosis not present

## 2016-12-30 DIAGNOSIS — R531 Weakness: Secondary | ICD-10-CM | POA: Diagnosis not present

## 2016-12-30 DIAGNOSIS — Z79899 Other long term (current) drug therapy: Secondary | ICD-10-CM | POA: Diagnosis not present

## 2016-12-30 LAB — COMPREHENSIVE METABOLIC PANEL
ALK PHOS: 62 U/L (ref 38–126)
ALT: 17 U/L (ref 14–54)
AST: 27 U/L (ref 15–41)
Albumin: 4.1 g/dL (ref 3.5–5.0)
Anion gap: 10 (ref 5–15)
BUN: 20 mg/dL (ref 6–20)
CALCIUM: 10.4 mg/dL — AB (ref 8.9–10.3)
CHLORIDE: 102 mmol/L (ref 101–111)
CO2: 26 mmol/L (ref 22–32)
CREATININE: 0.93 mg/dL (ref 0.44–1.00)
GFR, EST NON AFRICAN AMERICAN: 55 mL/min — AB (ref >60)
Glucose, Bld: 104 mg/dL — ABNORMAL HIGH (ref 65–99)
Potassium: 3.4 mmol/L — ABNORMAL LOW (ref 3.5–5.1)
Sodium: 138 mmol/L (ref 135–145)
Total Bilirubin: 0.8 mg/dL (ref 0.3–1.2)
Total Protein: 7.5 g/dL (ref 6.5–8.1)

## 2016-12-30 LAB — CBC
HCT: 38 % (ref 35.0–47.0)
Hemoglobin: 13.2 g/dL (ref 12.0–16.0)
MCH: 33.8 pg (ref 26.0–34.0)
MCHC: 34.7 g/dL (ref 32.0–36.0)
MCV: 97.5 fL (ref 80.0–100.0)
Platelets: 168 10*3/uL (ref 150–440)
RBC: 3.9 MIL/uL (ref 3.80–5.20)
RDW: 13.8 % (ref 11.5–14.5)
WBC: 6.2 10*3/uL (ref 3.6–11.0)

## 2016-12-30 MED ORDER — ATORVASTATIN CALCIUM 40 MG PO TABS: 40.0000 mg | ORAL_TABLET | Freq: Every day | ORAL | 0 refills | Status: DC

## 2016-12-30 MED ORDER — ASPIRIN 81 MG PO CHEW
324.0000 mg | CHEWABLE_TABLET | Freq: Once | ORAL | Status: AC
  Administered 2016-12-30: 324 mg via ORAL
  Filled 2016-12-30: qty 4

## 2016-12-30 NOTE — Telephone Encounter (Signed)
She is in the ED now  Thanks  

## 2016-12-30 NOTE — ED Provider Notes (Signed)
[facility] Emergency Department Provider Note  ____________________________________________   First MD Initiated Contact with Patient 12/30/16 1502     (approximate)  I have reviewed the triage vital signs and the nursing notes.   HISTORY  Chief Complaint No chief complaint on file.   HPI [name] is a 83 y.o. female who comes to the emergency department with roughly 1 week of insidious onset constant tingling and weakness in her left hand.  He is right-hand dominant.  She has no particular changes today but her friend noticed her hand was "clumsy " and recommended she come in.   [facility] and [facility].  She takes clopidogrel daily although no medication for her cholesterol.  She has no chest pain shortness of breath abdominal pain nausea vomiting headache double vision or blurred vision.  She has no leg numbness or weakness.  Her symptoms began insidiously and have been constant ever since.  Nothing in particular seems to make them better or worse.  Past Medical History:  Diagnosis Date  . Chronic pain   . DDD (degenerative disc disease)   . Diverticulosis   . Hyperlipidemia   . Hypertension   . Osteopenia   . Stroke (HCC)   . Syncope     Patient Active Problem List   Diagnosis Date Noted  . Hearing loss 01/09/2016  . Routine general medical examination at a health care facility 12/31/2015  . Light headed 09/29/2015  . Dysuria 09/29/2015  . Anxiety and depression 09/21/2014  . Pedal edema 06/22/2014  . Encounter for Medicare annual wellness exam 03/18/2014  . Retinal vein occlusion 03/18/2014  . B12 deficiency 03/15/2014  . Colon cancer screening 04/19/2013  . At risk for falling 12/02/2011  . Hyperparathyroidism (HCC) 11/20/2011  . Back pain, thoracic 09/30/2011  . Scoliosis 09/30/2011  . Degenerative disc disease, lumbar 09/30/2011  . Renal insufficiency 05/27/2011  . Syncope 12/05/2010  . CVA  02/26/2008  . Hyperlipidemia 05/12/2006  . Essential hypertension 05/12/2006  . DIVERTICULOSIS, COLON 05/12/2006  . Osteopenia 05/12/2006    Past Surgical History:  Procedure Laterality Date  . ABDOMINAL HYSTERECTOMY    . carotid doppler     normal  . DOPPLER ECHOCARDIOGRAPHY     normal  . EXPLORATORY LAPAROTOMY     2 times both neg   . LAMINECTOMY  06-2003   l2-l4  . SPINE SURGERY     l3-l4 microdiscectomy   . TONSILLECTOMY      Prior to Admission medications   Medication Sig Start Date End Date Taking? Authorizing Provider  Ascorbic Acid (VITAMIN C PO) Take 1 tablet by mouth.    [provider]  atorvastatin (LIPITOR) 40 MG tablet Take 1 tablet (40 mg total) at bedtime by mouth. 12/30/16 12/30/17  Merrily Brittleifenbark, Numan Zylstra, MD  Cholecalciferol (VITAMIN D PO) Take 1 tablet by mouth daily.    [provider]  clopidogrel (PLAVIX) 75 MG tablet Take 1 tablet (75 mg total) by mouth daily. 01/09/16   Tower, Audrie GallusMarne A, MD  Cyanocobalamin (VITAMIN B-12 IJ) Inject as directed See admin instructions. Inject every 2 months - last injection August 2016    [provider]  fish oil-omega-3 fatty acids 1000 MG capsule Take 1 g by mouth daily.    [provider]  gabapentin (NEURONTIN) 300 MG capsule Take 1 tablet at bedtime 07/26/16   Nita SicklePatel, Donika K, DO  hydrochlorothiazide (HYDRODIURIL) 25 MG tablet Take 1 tablet (25  mg total) by mouth daily. 01/09/16   Tower, Audrie Gallus, MD  losartan (COZAAR) 100 MG tablet Take 0.5 tablets (50 mg total) by mouth daily. 01/09/16   Tower, Audrie Gallus, MD  sertraline (ZOLOFT) 25 MG tablet Take 2 tablets (50 mg total) by mouth daily. 01/09/16   Tower, Audrie Gallus, MD  timolol (TIMOPTIC) 0.5 % ophthalmic solution Place 1 drop into the right eye 2 (two) times daily. 11/03/14   [provider]    Allergies Celecoxib; Codeine; Lisinopril; and Etodolac  Family History  Problem Relation Age of Onset  . Hypertension Father   . COPD Father     . Other Mother        Deceased, 83  . Healthy Brother     Social History Social History   Tobacco Use  . Smoking status: Never Smoker  . Smokeless tobacco: Never Used  Substance Use Topics  . Alcohol use: No    Alcohol/week: 0.0 oz  . Drug use: No    Review of Systems Constitutional: No fever/chills Eyes: No visual changes. ENT: No sore throat. Cardiovascular: Denies chest pain. Respiratory: Denies shortness of breath. Gastrointestinal: No abdominal pain.  No nausea, no vomiting.  No diarrhea.  No constipation. Genitourinary: Negative for dysuria. Musculoskeletal: Negative for back pain. Skin: Negative for rash. Neurological: Positive for focal numbness and weakness   ____________________________________________   PHYSICAL EXAM:  VITAL SIGNS: ED Triage Vitals  Enc Vitals Group     BP 12/30/16 1359 127/77     Pulse Rate 12/30/16 1359 83     Resp 12/30/16 1359 18     Temp 12/30/16 1359 97.7 F (36.5 C)     Temp Source 12/30/16 1359 Oral     SpO2 12/30/16 1359 98 %     Weight 12/30/16 1400 164 lb (74.4 kg)     Height 12/30/16 1400 5\' 7"  (1.702 m)     Head Circumference --      Peak Flow --      Pain Score --      Pain Loc --      Pain Edu? --      Excl. in GC? --     Constitutional: Alert and oriented x4 somewhat hard of hearing but very well-appearing nontoxic no diaphoresis peaks in full clear sentences Eyes: PERRL EOMI. Head: Atraumatic. Nose: No congestion/rhinnorhea. Mouth/Throat: No trismus Neck: No stridor.   Cardiovascular: Bradycardic rate, regular rhythm. Grossly normal heart sounds.  Good peripheral circulation. Respiratory: Normal respiratory effort.  No retractions. Lungs CTAB and moving good air Gastrointestinal: Soft nontender Musculoskeletal: No lower extremity edema   Neurologic:  Alert and oriented 4 Cranial nerves II through XII intact Positive pronator drift on the left with 4+ out of 5 grip on the left All other strength  5/5 Sensation intact to light touch throughout 2+ DTRs and no ankle clonus Normal finger-nose-finger Ambulates with steady gait  Skin:  Skin is warm, dry and intact. No rash noted. Psychiatric: Mood and affect are normal. Speech and behavior are normal.    ____________________________________________   DIFFERENTIAL includes but not limited to  Stroke, TIA, complex migraine ____________________________________________   LABS (all labs ordered are listed, but only abnormal results are displayed)  Labs Reviewed  COMPREHENSIVE METABOLIC PANEL - Abnormal; Notable for the following components:      Result Value   Potassium 3.4 (*)    Glucose, Bld 104 (*)    Calcium 10.4 (*)    GFR calc non Af  Amer 55 (*)    All other components within normal limits  CBC    Blood work reviewed and interpreted by me with no acute disease __________________________________________  EKG   ____________________________________________  RADIOLOGY  Head CT reviewed by me with subacute infarct ____________________________________________   PROCEDURES  Procedure(s) performed: no  Procedures  Critical Care performed: no  Observation: no ____________________________________________   INITIAL IMPRESSION / ASSESSMENT AND PLAN / ED COURSE  Pertinent labs & imaging results that were available during my care of the patient were reviewed by me and considered in my medical decision making (see chart for details).  On arrival the patient is very well appearing with one week of constant mild LUE symptoms.  Head CT confirms ischemic stroke.  I'll give her 324mg  of aspirin now and reach out to Neurology for further recs.     ----------------------------------------- 3:14 PM on 12/30/2016 -----------------------------------------  I discussed the case with on call stroke Neurologist Dr. Thad Rangereynolds who recommends continuation of Plavix for antiplatelet effects as well as addition of a statin.   As the patient's symptoms are so mild and she is 1 week out from the onset she does not require inpatient admission.  The patient and her friend at bedside verbalized understanding and agreement with follow-up with primary care in 2 days for recheck.  The patient is discharged home in improved condition verbalizes understanding and agreement with the plan. ____________________________________________   FINAL CLINICAL IMPRESSION(S) / ED DIAGNOSES  Final diagnoses:  Cerebrovascular accident (CVA), unspecified mechanism (HCC)      NEW MEDICATIONS STARTED DURING THIS VISIT:  This SmartLink is deprecated. Use AVSMEDLIST instead to display the medication list for a patient.   Note:  This document was prepared using Dragon voice recognition software and may include unintentional dictation errors.     Merrily Brittleifenbark, Mouhamadou Gittleman, MD 12/30/16 [phone]

## 2016-12-30 NOTE — Telephone Encounter (Signed)
Pt  States  yest  She   Had   Numbness   l arm  And  Hand    With   Weakness   Noted   As   Well    Symptoms  Developed   Yesterday  Am    She  Reports  Her  l  Arm  Is  Weak    She is  Able  To  Raise  It  Some   She is  Awake  And  Alert    Speaking  In  complete   sentances  Spoke   With  waynetta   [facility]  AND  APPROVED  TO  GO  TO  ER    Reason for Disposition . [1] Weakness (i.e., paralysis, loss of muscle strength) of the face, arm / hand, or leg / foot on one side of the body AND [2] sudden onset AND [3] brief (now gone)  Protocols used: NEUROLOGIC DEFICIT-A-AH

## 2016-12-30 NOTE — Discharge Instructions (Signed)
Please begin taking cholesterol medication every night and follow-up with your primary care physician in 2 days for recheck.  Return to the emergency department sooner for any concerns whatsoever.  It was a pleasure to take care of you today, and thank you for coming to our emergency department.  If you have any questions or concerns before leaving please ask the nurse to grab me and I'm more than happy to go through your aftercare instructions again.  If you were prescribed any opioid pain medication today such as Norco, Vicodin, Percocet, morphine, hydrocodone, or oxycodone please make sure you do not drive when you are taking this medication as it can alter your ability to drive safely.  If you have any concerns once you are home that you are not improving or are in fact getting worse before you can make it to your follow-up appointment, please do not hesitate to call 911 and come back for further evaluation.  Merrily BrittleNeil Rozalia Dino, MD  Results for orders placed or performed during the hospital encounter of 12/30/16  Comprehensive metabolic panel  Result Value Ref Range   Sodium 138 135 - 145 mmol/L   Potassium 3.4 (L) 3.5 - 5.1 mmol/L   Chloride 102 101 - 111 mmol/L   CO2 26 22 - 32 mmol/L   Glucose, Bld 104 (H) 65 - 99 mg/dL   BUN 20 6 - 20 mg/dL   Creatinine, Ser 4.090.93 0.44 - 1.00 mg/dL   Calcium 81.110.4 (H) 8.9 - 10.3 mg/dL   Total Protein 7.5 6.5 - 8.1 g/dL   Albumin 4.1 3.5 - 5.0 g/dL   AST 27 15 - 41 U/L   ALT 17 14 - 54 U/L   Alkaline Phosphatase 62 38 - 126 U/L   Total Bilirubin 0.8 0.3 - 1.2 mg/dL   GFR calc non Af Amer 55 (L) >60 mL/min   GFR calc Af Amer >60 >60 mL/min   Anion gap 10 5 - 15  CBC  Result Value Ref Range   WBC 6.2 3.6 - 11.0 K/uL   RBC 3.90 3.80 - 5.20 MIL/uL   Hemoglobin 13.2 12.0 - 16.0 g/dL   HCT 91.438.0 78.235.0 - 95.647.0 %   MCV 97.5 80.0 - 100.0 fL   MCH 33.8 26.0 - 34.0 pg   MCHC 34.7 32.0 - 36.0 g/dL   RDW 21.313.8 08.611.5 - 57.814.5 %   Platelets 168 150 - 440 K/uL    Ct Head Wo Contrast  Result Date: 12/30/2016 CLINICAL DATA:  Left arm weakness for a few days.  Prior stroke. EXAM: CT HEAD WITHOUT CONTRAST TECHNIQUE: Contiguous axial images were obtained from the base of the skull through the vertex without intravenous contrast. COMPARISON:  Head CT 11/16/2011 and MRI 11/17/2011 FINDINGS: Brain: There is a new small cortical/subcortical infarct in the right precentral gyrus in the hand motor region, of indeterminate acuity though possibly subacute. There is no evidence of acute large territory infarct, acute intracranial hemorrhage, mass, midline shift, or extra-axial fluid collection. Periventricular white matter hypodensities are nonspecific but compatible with mild chronic small vessel ischemic disease. A chronic infarct is again noted in the inferior left cerebellar hemisphere. There is mild cerebral atrophy. Vascular: Calcified atherosclerosis at the skullbase. No hyperdense vessel. Skull: No fracture or focal osseous lesion. Sinuses/Orbits: Visualized paranasal sinuses and mastoid air cells are clear. Bilateral cataract extraction. Other: None. IMPRESSION: 1. New small posterior right frontal infarct, possibly subacute. 2. No intracranial hemorrhage. 3. Mild chronic small vessel ischemic disease. Old  left cerebellar infarct. Electronically Signed   By: Sebastian AcheAllen  Grady M.D.   On: 12/30/2016 14:40

## 2016-12-30 NOTE — ED Triage Notes (Signed)
[facility]/o left arm weakness for a few days now, has a hx of stroke about 4 years ago. States her left arm has progressivly gotten worse over the past few days. Slight drift, however, grips weak bilaterally. Pt states her arm has been hard to direct and seems to "flop around." Speech clear, no facial droop noted.

## 2016-12-30 NOTE — Telephone Encounter (Signed)
   Answer Assessment - Initial Assessment Questions 1. SYMPTOM: "What is the main symptom you are concerned about?" (e.g., weakness, numbness)    Weakness  And  Numbness  l   Arm  And  l   Hand     2. ONSET: "When did this start?" (minutes, hours, days; while sleeping)     yest   Am   3. LAST NORMAL: "When was the last time you were normal (no symptoms)?"Yest      Was  Normal  4. PATTERN "Does this come and go, or has it been constant since it started?"  "Is it present now?"     Pt  Reports  Constant   5. CARDIAC SYMPTOMS: "Have you had any of the following symptoms: chest pain, difficulty breathing, palpitations?"     No  Chest  Pain   No  Shortness  Of  Breath   6. NEUROLOGIC SYMPTOMS: "Have you had any of the following symptoms: headache, dizziness, vision loss, double vision, changes in speech, unsteady on your feet?"      no 7. OTHER SYMPTOMS: "Do you have any other symptoms?"     Weakness   l   Arm     The  Arm   Feels   Slightly  Heavy   Pt   Reports  She  Can  Make   A   Fist     8. PREGNANCY: "Is there any chance you are pregnant?" "When was your last menstrual period?"     no  Protocols used: NEUROLOGIC DEFICIT-A-AH

## 2016-12-30 NOTE — ED Notes (Signed)
Pt presents today with weakness in the left arm. Pt is able to move Left arm. Pt is unaware when left arm started numbness and weakness.  EDP at bedside.

## 2017-01-01 ENCOUNTER — Ambulatory Visit: Payer: Medicare Other | Admitting: Family Medicine

## 2017-01-01 ENCOUNTER — Encounter: Payer: Self-pay | Admitting: Family Medicine

## 2017-01-01 VITALS — BP 112/78 | HR 91 | Temp 97.6°F | Wt 154.0 lb

## 2017-01-01 DIAGNOSIS — H53451 Other localized visual field defect, right eye: Secondary | ICD-10-CM | POA: Diagnosis not present

## 2017-01-01 DIAGNOSIS — I639 Cerebral infarction, unspecified: Secondary | ICD-10-CM | POA: Diagnosis not present

## 2017-01-01 NOTE — Progress Notes (Signed)
Subjective:    Patient ID: [name], female    DOB: [birth year], 83 y.o.   MRN: [id number]  HPI  This is an 83 year old female, accompanied by her niece, who presents today for follow-up from emergency room visit 2 days ago.  She went to the emergency room after having about a week of increased weakness and tingling in her left hand.  On physical exam in the emergency room she did have positive pronator drift in the left arm with decreased grip strength.  Head CT demonstrated a new small posterior right frontal infarct, possibly subacute.  Mild chronic small vessel disease.  Old left cerebellar infarct..  Neurology was consulted and no inpatient admission was advised.  She is already on clopidogrel and she was started on atorvastatin 40 mg.  She was overdue in getting her clopidogrel prescription filled and when her niece looked in her bottle there were several pills and they are indicating that she may not have been taking her clopidogrel recently.  She denies fever, pain, she reports decreased appetite and fatigue.  She reports she has been doing well since going home.  She denies any falls.  She lives alone and is able to do her ADLs.  She still drives.  She uses a cane when she leaves the house but not inside of the house.  She has ocular occlusion and has follow-up with ophthalmologist next week.    Past Medical History:  Diagnosis Date  . Chronic pain   . DDD (degenerative disc disease)   . Diverticulosis   . Hyperlipidemia   . Hypertension   . Osteopenia   . Stroke (HCC)   . Syncope    Past Surgical History:  Procedure Laterality Date  . ABDOMINAL HYSTERECTOMY    . carotid doppler     normal  . DOPPLER ECHOCARDIOGRAPHY     normal  . EXPLORATORY LAPAROTOMY     2 times both neg   . LAMINECTOMY  06-2003   l2-l4  . SPINE SURGERY     l3-l4 microdiscectomy   . TONSILLECTOMY     Family History  Problem Relation Age of Onset  . Hypertension Father   . COPD Father   .  Other Mother        Deceased, [id number]  . Healthy Brother    Social History   Tobacco Use  . Smoking status: Never Smoker  . Smokeless tobacco: Never Used  Substance Use Topics  . Alcohol use: No    Alcohol/week: 0.0 oz  . Drug use: No     Review of Systems Per HPI    Objective:   Physical Exam  Constitutional: She appears well-developed and well-nourished. No distress.  Mildly HOH.   HENT:  Head: Normocephalic and atraumatic.  Eyes: Conjunctivae are normal.  Cardiovascular: Normal rate, regular rhythm and normal heart sounds.  Pulmonary/Chest: Effort normal and breath sounds normal.  Musculoskeletal:  Gait- using a cane, swinging gait.   Neurological: She is alert.  Skin: She is not diaphoretic.  Left hand and lower arm cooler than right, right and left radial pulses equal. Good color and cap refill. No pronator drift today. Left arm strength 4/5, right 5/5. Normal ROM.   Psychiatric: She has a normal mood and affect. Her behavior is normal. Judgment and thought content normal.  Vitals reviewed.     BP 112/78 (BP Location: Right Arm, Patient Position: Sitting, Cuff Size: Normal)   Pulse 91   Temp 97.6 F (36.4 C) (  Oral)   Wt 154 lb (69.9 kg)   SpO2 94%   BMI 24.12 kg/m  Wt Readings from Last 3 Encounters:  01/01/17 154 lb (69.9 kg)  12/30/16 164 lb (74.4 kg)  07/26/16 168 lb 4 oz (76.3 kg)       Assessment & Plan:  1. Cerebrovascular accident (CVA), unspecified mechanism (HCC) - resume clopidogrel, her niece is going to start preparing her medication boxes - follow up with neurologist- her niece will call for appointment - the patient was instructed not to drive until she follows up with neuro and opthalomology - RTC/ER precautions reviewed - follow up with PCP as scheduled  2. Decreased peripheral vision of right eye - follow up as scheduled next week, no driving until cleared   Olean Reeeborah Kaysen Sefcik, FNP-BC  [facility] [place]  01/03/2017 10:20 AM

## 2017-01-01 NOTE — Patient Instructions (Signed)
It was a pleasure to meet you today  No driving until you have your eye appointment  Continue your current medications  Use your cane all the time

## 2017-01-03 ENCOUNTER — Encounter: Payer: Self-pay | Admitting: Family Medicine

## 2017-01-04 ENCOUNTER — Other Ambulatory Visit: Payer: Self-pay | Admitting: Family Medicine

## 2017-01-08 DIAGNOSIS — H348112 Central retinal vein occlusion, right eye, stable: Secondary | ICD-10-CM | POA: Diagnosis not present

## 2017-01-08 DIAGNOSIS — H4051X2 Glaucoma secondary to other eye disorders, right eye, moderate stage: Secondary | ICD-10-CM | POA: Diagnosis not present

## 2017-01-08 DIAGNOSIS — H211X1 Other vascular disorders of iris and ciliary body, right eye: Secondary | ICD-10-CM | POA: Diagnosis not present

## 2017-01-13 ENCOUNTER — Telehealth: Payer: Self-pay | Admitting: Family Medicine

## 2017-01-13 DIAGNOSIS — I10 Essential (primary) hypertension: Secondary | ICD-10-CM

## 2017-01-13 DIAGNOSIS — E7800 Pure hypercholesterolemia, unspecified: Secondary | ICD-10-CM

## 2017-01-13 DIAGNOSIS — E213 Hyperparathyroidism, unspecified: Secondary | ICD-10-CM

## 2017-01-13 DIAGNOSIS — E538 Deficiency of other specified B group vitamins: Secondary | ICD-10-CM

## 2017-01-13 NOTE — Telephone Encounter (Signed)
-----   Message from Robert Bellowarlesia R Pinson, LPN sent at 16/1/096012/04/2016  8:58 AM EST ----- Regarding: Labs 12/4 Lab orders needed. Thank you.  Insurance:  [employer]

## 2017-01-15 ENCOUNTER — Ambulatory Visit: Payer: Medicare Other

## 2017-01-16 ENCOUNTER — Encounter: Payer: Self-pay | Admitting: Family Medicine

## 2017-01-21 ENCOUNTER — Ambulatory Visit: Payer: Medicare Other

## 2017-01-22 ENCOUNTER — Encounter: Payer: Medicare Other | Admitting: Family Medicine

## 2017-01-22 ENCOUNTER — Ambulatory Visit: Payer: Medicare Other

## 2017-01-29 ENCOUNTER — Telehealth: Payer: Self-pay | Admitting: Family Medicine

## 2017-01-29 MED ORDER — ATORVASTATIN CALCIUM 40 MG PO TABS: 40.0000 mg | ORAL_TABLET | Freq: Every day | ORAL | 3 refills | Status: DC

## 2017-01-29 NOTE — Telephone Encounter (Signed)
Copied from CRM [id number]. Topic: Quick Communication - Rx Refill/Question >> Jan 29, 2017  2:20 PM [name], [name] wrote: Has the patient contacted their pharmacy? Yes.     (Agent: If no, request that the patient contact the pharmacy for the refill.)   Preferred Pharmacy (with phone number or street name): [facility] [address]. Pt would like refill of atorvastatin (LIPITOR) 40 MG tablet. Cb # [phone]    Agent: Please be advised that RX refills may take up to 3 business days. We ask that you follow-up with your pharmacy.

## 2017-01-29 NOTE — Telephone Encounter (Signed)
I sent it [facility] to [facility] Thanks

## 2017-01-29 NOTE — Telephone Encounter (Signed)
Med was ordered from ED visit on 12/30/16 for 30 tablets by ED provider. Sending back to office for PCP approval. Was seen in office 01/01/17.

## 2017-01-29 NOTE — Telephone Encounter (Signed)
Last labs 12/2015. Pt has upcoming 02/2017 appt. pls advise

## 2017-02-13 ENCOUNTER — Other Ambulatory Visit: Payer: Self-pay | Admitting: Family Medicine

## 2017-02-19 ENCOUNTER — Encounter: Payer: Medicare Other | Admitting: Family Medicine

## 2017-02-19 ENCOUNTER — Ambulatory Visit: Payer: Medicare Other

## 2017-02-21 DIAGNOSIS — H4051X2 Glaucoma secondary to other eye disorders, right eye, moderate stage: Secondary | ICD-10-CM | POA: Diagnosis not present

## 2017-02-21 DIAGNOSIS — H348112 Central retinal vein occlusion, right eye, stable: Secondary | ICD-10-CM | POA: Diagnosis not present

## 2017-02-21 DIAGNOSIS — H4311 Vitreous hemorrhage, right eye: Secondary | ICD-10-CM | POA: Diagnosis not present

## 2017-02-21 DIAGNOSIS — H35362 Drusen (degenerative) of macula, left eye: Secondary | ICD-10-CM | POA: Diagnosis not present

## 2017-02-22 ENCOUNTER — Encounter: Payer: Self-pay | Admitting: Family Medicine

## 2017-02-24 ENCOUNTER — Encounter: Payer: Medicare Other | Admitting: Family Medicine

## 2017-02-24 NOTE — Telephone Encounter (Signed)
Please give the ok to her eye surgeon  Thanks

## 2017-02-24 NOTE — Telephone Encounter (Signed)
Please let family know that if she does hold the plavix she will have a higher stroke risk for that time frame  It sounds like we don't have a choice however given the email message   Just important that they are aware of that   I will cc this to her neurologist as well   Good luck with everything!

## 2017-02-25 ENCOUNTER — Ambulatory Visit (INDEPENDENT_AMBULATORY_CARE_PROVIDER_SITE_OTHER): Payer: Medicare Other

## 2017-02-25 ENCOUNTER — Encounter: Payer: Self-pay | Admitting: Family Medicine

## 2017-02-25 ENCOUNTER — Ambulatory Visit (INDEPENDENT_AMBULATORY_CARE_PROVIDER_SITE_OTHER): Payer: Medicare Other | Admitting: Family Medicine

## 2017-02-25 VITALS — BP 110/80 | HR 72 | Temp 97.6°F | Ht 63.5 in | Wt 160.5 lb

## 2017-02-25 VITALS — BP 110/80 | HR 72 | Temp 97.6°F | Ht 63.0 in | Wt 160.5 lb

## 2017-02-25 DIAGNOSIS — I6350 Cerebral infarction due to unspecified occlusion or stenosis of unspecified cerebral artery: Secondary | ICD-10-CM | POA: Diagnosis not present

## 2017-02-25 DIAGNOSIS — Z0000 Encounter for general adult medical examination without abnormal findings: Secondary | ICD-10-CM | POA: Diagnosis not present

## 2017-02-25 DIAGNOSIS — E538 Deficiency of other specified B group vitamins: Secondary | ICD-10-CM

## 2017-02-25 DIAGNOSIS — E7800 Pure hypercholesterolemia, unspecified: Secondary | ICD-10-CM

## 2017-02-25 DIAGNOSIS — I10 Essential (primary) hypertension: Secondary | ICD-10-CM | POA: Diagnosis not present

## 2017-02-25 DIAGNOSIS — M8588 Other specified disorders of bone density and structure, other site: Secondary | ICD-10-CM | POA: Diagnosis not present

## 2017-02-25 DIAGNOSIS — H9193 Unspecified hearing loss, bilateral: Secondary | ICD-10-CM

## 2017-02-25 DIAGNOSIS — N289 Disorder of kidney and ureter, unspecified: Secondary | ICD-10-CM

## 2017-02-25 DIAGNOSIS — E213 Hyperparathyroidism, unspecified: Secondary | ICD-10-CM

## 2017-02-25 DIAGNOSIS — H348192 Central retinal vein occlusion, unspecified eye, stable: Secondary | ICD-10-CM

## 2017-02-25 LAB — COMPREHENSIVE METABOLIC PANEL
ALBUMIN: 4.2 g/dL (ref 3.5–5.2)
ALK PHOS: 69 U/L (ref 39–117)
ALT: 15 U/L (ref 0–35)
AST: 18 U/L (ref 0–37)
BILIRUBIN TOTAL: 0.7 mg/dL (ref 0.2–1.2)
BUN: 21 mg/dL (ref 6–23)
CO2: 30 mEq/L (ref 19–32)
Calcium: 10.4 mg/dL (ref 8.4–10.5)
Chloride: 103 mEq/L (ref 96–112)
Creatinine, Ser: 0.87 mg/dL (ref 0.40–1.20)
GFR: 65.78 mL/min (ref >60.00)
Glucose, Bld: 101 mg/dL — ABNORMAL HIGH (ref 70–99)
POTASSIUM: 4.1 meq/L (ref 3.5–5.1)
Sodium: 141 mEq/L (ref 135–145)
TOTAL PROTEIN: 7.3 g/dL (ref 6.0–8.3)

## 2017-02-25 LAB — CBC WITH DIFFERENTIAL/PLATELET
BASOS ABS: 0.1 10*3/uL (ref 0.0–0.1)
Basophils Relative: 0.7 % (ref 0.0–3.0)
EOS PCT: 4.2 % (ref 0.0–5.0)
Eosinophils Absolute: 0.3 10*3/uL (ref 0.0–0.7)
HCT: 39.9 % (ref 36.0–46.0)
Hemoglobin: 13.3 g/dL (ref 12.0–15.0)
LYMPHS PCT: 26.4 % (ref 12.0–46.0)
Lymphs Abs: 1.9 10*3/uL (ref 0.7–4.0)
MCHC: 33.3 g/dL (ref 30.0–36.0)
MCV: 98.2 fl (ref 78.0–100.0)
MONOS PCT: 10.4 % (ref 3.0–12.0)
Monocytes Absolute: 0.7 10*3/uL (ref 0.1–1.0)
NEUTROS PCT: 58.3 % (ref 43.0–77.0)
Neutro Abs: 4.1 10*3/uL (ref 1.4–7.7)
Platelets: 200 10*3/uL (ref 150.0–400.0)
RBC: 4.06 Mil/uL (ref 3.87–5.11)
RDW: 13.6 % (ref 11.5–15.5)
WBC: 7 10*3/uL (ref 4.0–10.5)

## 2017-02-25 LAB — LIPID PANEL
CHOLESTEROL: 142 mg/dL (ref 0–200)
HDL: 82.4 mg/dL (ref >39.00)
LDL Cholesterol: 45 mg/dL (ref 0–99)
NONHDL: 59.7
TRIGLYCERIDES: 74 mg/dL (ref 0.0–149.0)
Total CHOL/HDL Ratio: 2
VLDL: 14.8 mg/dL (ref 0.0–40.0)

## 2017-02-25 LAB — TSH: TSH: 0.9 u[IU]/mL (ref 0.35–4.50)

## 2017-02-25 LAB — VITAMIN B12: Vitamin B-12: >1500 pg/mL — ABNORMAL HIGH (ref 211–911)

## 2017-02-25 LAB — VITAMIN D 25 HYDROXY (VIT D DEFICIENCY, FRACTURES): VITD: 31.61 ng/mL (ref 30.00–100.00)

## 2017-02-25 LAB — PHOSPHORUS: Phosphorus: 2.7 mg/dL (ref 2.3–4.6)

## 2017-02-25 MED ORDER — LOSARTAN POTASSIUM 100 MG PO TABS: ORAL_TABLET | ORAL | 3 refills | Status: DC

## 2017-02-25 MED ORDER — CLOPIDOGREL BISULFATE 75 MG PO TABS: 75.0000 mg | ORAL_TABLET | Freq: Every day | ORAL | 3 refills | Status: DC

## 2017-02-25 MED ORDER — HYDROCHLOROTHIAZIDE 25 MG PO TABS: 25.0000 mg | ORAL_TABLET | Freq: Every day | ORAL | 3 refills | Status: DC

## 2017-02-25 MED ORDER — SERTRALINE HCL 50 MG PO TABS: ORAL_TABLET | ORAL | 3 refills | Status: DC

## 2017-02-25 MED ORDER — ATORVASTATIN CALCIUM 40 MG PO TABS: 40.0000 mg | ORAL_TABLET | Freq: Every day | ORAL | 3 refills | Status: DC

## 2017-02-25 MED ORDER — CYANOCOBALAMIN 1000 MCG/ML IJ SOLN
1000.0000 ug | Freq: Once | INTRAMUSCULAR | Status: AC
Start: 2017-02-25 — End: 2017-02-25
  Administered 2017-02-25: 1000 ug via INTRAMUSCULAR

## 2017-02-25 NOTE — Progress Notes (Signed)
Subjective:    Patient ID: [name], female    DOB: 1930/10/31, 84 y.o.   MRN: [id number]  HPI Here for health maintenance exam and to review chronic medical problems    She needs to have R eye surgery for hemorrhage and will have to hold plavix for 7 d   Related to retinal vein occlusion from the past  Planned for 1/24 (pre op Friday )  Her neurologist is aware Hx of stroke in the past   Has not been to church in November - not getting out much  Family does not want her to drive (I agree)   Mood is pretty stable per pt  Takes zoloft  Does not always sleep well   Just had amw today  Depression score is 3 No concerns  B12 injection given -due for next one in march   Wt Readings from Last 3 Encounters:  02/25/17 160 lb 8 oz (72.8 kg)  02/25/17 160 lb 8 oz (72.8 kg)  01/01/17 154 lb (69.9 kg)  up 6 lb  Appetite is "too good"  28.43 kg/m   Breast cancer screening -declines   Colon cancer screening -declines   Tetanus shot - will look into getting at a pharmacy   dexa 4/13-osteopenia of the hips  Declines tx or further monitoring  occ forgets cane / occ fall   Zoster status -may be interested in shingrix Has had shingles at least once   bp is stable today  No cp or palpitations or headaches or edema  No side effects to medicines  BP Readings from Last 3 Encounters:  02/25/17 110/80  02/25/17 110/80  01/01/17 112/78    Hx of CVA in the past as well as retinal vein occlusion Unsure if she has had multiple strokes   Lab Results  Component Value Date   CREATININE 0.93 12/30/2016   BUN 20 12/30/2016   NA 138 12/30/2016   K 3.4 (L) 12/30/2016   CL 102 12/30/2016   CO2 26 12/30/2016   Hx of hyperparathyroidism Vit D intake -taking it daily  Last D level 26 in 2017  Lab Results  Component Value Date   CALCIUM 10.4 (H) 12/30/2016   PHOS 2.9 05/05/2012    Lab Results  Component Value Date   ALT 17 12/30/2016   AST 27 12/30/2016   ALKPHOS 62  12/30/2016   BILITOT 0.8 12/30/2016    Lab Results  Component Value Date   WBC 6.2 12/30/2016   HGB 13.2 12/30/2016   HCT 38.0 12/30/2016   MCV 97.5 12/30/2016   PLT 168 12/30/2016    Lab Results  Component Value Date   TSH 0.80 01/02/2016    Hyperlipidemia Lab Results  Component Value Date   CHOL 213 (H) 01/02/2016   HDL 86.70 01/02/2016   LDLCALC 115 (H) 01/02/2016   LDLDIRECT 121.0 03/17/2013   TRIG 60.0 01/02/2016   CHOLHDL 2 01/02/2016     Hearing loss - now has new hearing aides- uses one/ needs a new battery  Won't wear them often-hard to get in with her hand function  Misses a lot this way  One of her biggest problems - family does not know whether she cannot get them in or if she just does not want to wear them  They feel she would do better in a retirement community or assisted living   Patient Active Problem List   Diagnosis Date Noted  . Hearing loss 01/09/2016  . Routine  general medical examination at a health care facility 12/31/2015  . Light headed 09/29/2015  . Dysuria 09/29/2015  . Anxiety and depression 09/21/2014  . Pedal edema 06/22/2014  . Encounter for Medicare annual wellness exam 03/18/2014  . Retinal vein occlusion 03/18/2014  . B12 deficiency 03/15/2014  . Colon cancer screening 04/19/2013  . At risk for falling 12/02/2011  . Hyperparathyroidism (HCC) 11/20/2011  . Back pain, thoracic 09/30/2011  . Scoliosis 09/30/2011  . Degenerative disc disease, lumbar 09/30/2011  . Renal insufficiency 05/27/2011  . Syncope 12/05/2010  . Cerebral artery occlusion with cerebral infarction (HCC) 02/26/2008  . Hyperlipidemia 05/12/2006  . Essential hypertension 05/12/2006  . DIVERTICULOSIS, COLON 05/12/2006  . Osteopenia 05/12/2006   Past Medical History:  Diagnosis Date  . Chronic pain   . DDD (degenerative disc disease)   . Diverticulosis   . Hyperlipidemia   . Hypertension   . Osteopenia   . Stroke (HCC)   . Syncope    Past Surgical  History:  Procedure Laterality Date  . ABDOMINAL HYSTERECTOMY    . carotid doppler     normal  . DOPPLER ECHOCARDIOGRAPHY     normal  . EXPLORATORY LAPAROTOMY     2 times both neg   . LAMINECTOMY  06-2003   l2-l4  . SPINE SURGERY     l3-l4 microdiscectomy   . TONSILLECTOMY     Social History   Tobacco Use  . Smoking status: Never Smoker  . Smokeless tobacco: Never Used  Substance Use Topics  . Alcohol use: No    Alcohol/week: 0.0 oz  . Drug use: No   Family History  Problem Relation Age of Onset  . Hypertension Father   . COPD Father   . Other Mother        Deceased, 80  . Healthy Brother    Allergies  Allergen Reactions  . Celecoxib Other (See Comments)    affects kidneys  . Codeine Nausea Only and Other (See Comments)    dizziness  . Lisinopril Cough  . Etodolac Rash   Current Outpatient Medications on File Prior to Visit  Medication Sig Dispense Refill  . Ascorbic Acid (VITAMIN C PO) Take 1 tablet by mouth.    . Brinzolamide-Brimonidine (SIMBRINZA) 1-0.2 % SUSP Place 1 drop into the right eye 3 (three) times daily.     . Cholecalciferol (VITAMIN D PO) Take 1 tablet by mouth daily.    . Cyanocobalamin (VITAMIN B-12 IJ) Inject as directed See admin instructions. Inject every 2 months - last injection August 2016    . cyanocobalamin 1000 MCG tablet Take 1,000 mcg by mouth daily.    . fish oil-omega-3 fatty acids 1000 MG capsule Take 1 g by mouth daily.    Marland Kitchen gabapentin (NEURONTIN) 300 MG capsule Take 1 tablet at bedtime 90 capsule 3   No current facility-administered medications on file prior to visit.      Review of Systems  Constitutional: Negative for activity change, appetite change, fatigue, fever and unexpected weight change.  HENT: Positive for hearing loss. Negative for congestion, ear discharge, ear pain, rhinorrhea, sinus pressure and sore throat.   Eyes: Positive for visual disturbance. Negative for pain and redness.  Respiratory: Negative for  cough, shortness of breath and wheezing.   Cardiovascular: Negative for chest pain and palpitations.  Gastrointestinal: Negative for abdominal pain, blood in stool, constipation and diarrhea.  Endocrine: Negative for polydipsia and polyuria.  Genitourinary: Negative for dysuria, frequency and urgency.  Musculoskeletal: Positive  for back pain. Negative for arthralgias and myalgias.  Skin: Negative for pallor and rash.  Allergic/Immunologic: Negative for environmental allergies.  Neurological: Negative for dizziness, syncope and headaches.  Hematological: Negative for adenopathy. Does not bruise/bleed easily.  Psychiatric/Behavioral: Negative for decreased concentration and dysphoric mood. The patient is not nervous/anxious.        Objective:   Physical Exam  Constitutional: She appears well-developed and well-nourished. No distress.  overwt and well appearing with very poor hearing   HENT:  Head: Normocephalic and atraumatic.  Right Ear: External ear normal.  Left Ear: External ear normal.  Mouth/Throat: Oropharynx is clear and moist.  Eyes: Conjunctivae and EOM are normal. Pupils are equal, round, and reactive to light. No scleral icterus.  Neck: Normal range of motion. Neck supple. No JVD present. Carotid bruit is not present. No thyromegaly present.  Cardiovascular: Normal rate, regular rhythm, normal heart sounds and intact distal pulses. Exam reveals no gallop.  Pulmonary/Chest: Effort normal and breath sounds normal. No respiratory distress. She has no wheezes. She exhibits no tenderness.  Abdominal: Soft. Bowel sounds are normal. She exhibits no distension, no abdominal bruit and no mass. There is no tenderness.  Genitourinary:  Genitourinary Comments: Pt declines breast cancer screening   Musculoskeletal: Normal range of motion. She exhibits no edema or tenderness.  Scoliosis and kyphosis noted   Lymphadenopathy:    She has no cervical adenopathy.  Neurological: She is alert.  She has normal reflexes. No cranial nerve deficit. She exhibits normal muscle tone. Coordination normal.  Baseline hand clumsiness from cva  Skin: Skin is warm and dry. No rash noted. No erythema. No pallor.  sks diffusely  Psychiatric: She has a normal mood and affect.  Pt is very HOH and it is difficult to communicate with her           Assessment & Plan:   Problem List Items Addressed This Visit      Cardiovascular and Mediastinum   Cerebral artery occlusion with cerebral infarction (HCC)    Pt will have to hold plavix for mandatory eye surgery -risk of stroke will go up  She and family voice understanding Bp/ cholesterol controlled Continue neuro f/u      Relevant Medications   losartan (COZAAR) 100 MG tablet   hydrochlorothiazide (HYDRODIURIL) 25 MG tablet   atorvastatin (LIPITOR) 40 MG tablet   Essential hypertension    bp in fair control at this time  BP Readings from Last 1 Encounters:  02/25/17 110/80   No changes needed Disc lifstyle change with low sodium diet and exercise  Labs ordered      Relevant Medications   losartan (COZAAR) 100 MG tablet   hydrochlorothiazide (HYDRODIURIL) 25 MG tablet   atorvastatin (LIPITOR) 40 MG tablet   Retinal vein occlusion    Now with hemorrhage For surgery soon  Will have to hold plavix      Relevant Medications   losartan (COZAAR) 100 MG tablet   hydrochlorothiazide (HYDRODIURIL) 25 MG tablet   atorvastatin (LIPITOR) 40 MG tablet     Endocrine   Hyperparathyroidism (HCC) (Chronic)    Check ca and D today  Stressed the imp of D supplementation  Declines dexa         Nervous and Auditory   Hearing loss    Pt either refuses to put in hearing aides or has trouble doing so  At risk for falls/etc  Mimics dementia  D/w family  Musculoskeletal and Integument   Osteopenia    Declines further eval or tx  No fractures  High fall risk  Enc vit D supplementation         Genitourinary   Renal  insufficiency    Lab today  Enc water intake         Other   B12 deficiency    Inj q 2 mo  Level today      Hyperlipidemia    Lipid panel today  On statin and diet  H/o cva      Relevant Medications   losartan (COZAAR) 100 MG tablet   hydrochlorothiazide (HYDRODIURIL) 25 MG tablet   atorvastatin (LIPITOR) 40 MG tablet   Routine general medical examination at a health care facility - Primary    Reviewed health habits including diet and exercise and skin cancer prevention Reviewed appropriate screening tests for age  Also reviewed health mt list, fam hx and immunization status , as well as social and family history    See HPI amw rev  Labs today  Avs: I do not think driving is safe with your vision - it is time to give up your licence  I will send a note to your eye surgeon about holding plavix    For safety-please wear your hearing aides   Labs today - we will let you know when results come back    Don't go without your cane ! A pocketbook with a cross body strap will make that easier   Get batteries for her hearing aides   A retirement community would be great for you -socialization is important   Check with your pharmacy about getting a tetanus shot   There is a shingles vaccine in the pharmacies called Shingrix  See if you can get on a waiting list  It is a good idea

## 2017-02-25 NOTE — Progress Notes (Signed)
PCP notes:   Health maintenance:  No gaps identified.  Abnormal screenings:   Depression score: 3 Depression screen [facility] 02/25/2017 01/02/2016 09/27/2014 03/18/2014 03/17/2013  Decreased Interest 0 0 2 0 0  Down, Depressed, Hopeless 0 1 3 1  0  PHQ - 2 Score 0 1 5 1  0  Altered sleeping 1 - 3 - -  Tired, decreased energy 1 - 3 - -  Change in appetite 1 - 1 - -  Feeling bad or failure about yourself  0 - 0 - -  Trouble concentrating 0 - 1 - -  Moving slowly or fidgety/restless 0 - 0 - -  Suicidal thoughts 0 - 0 - -  PHQ-9 Score 3 - 13 - -  Difficult doing work/chores Not difficult at all - - - -   Patient concerns:   None  Nurse concerns:  B12 injection given. Patient encouraged to schedule nurse visit appt for March to prevent missing another injection.   Next PCP appt:   02/25/2017 @ 1515  I reviewed health advisor's note, was available for consultation, and agree with documentation and plan.  Roxy MannsMarne Tower MD

## 2017-02-25 NOTE — Progress Notes (Signed)
Subjective:   [name] is a 84 y.o. female who presents for Medicare Annual (Subsequent) preventive examination.  Review of Systems:  N/A Cardiac Risk Factors include: advanced age (>1255men, 73>65 women);hypertension;dyslipidemia     Objective:     Vitals: BP 110/80 (BP Location: Right Arm, Patient Position: Sitting, Cuff Size: Normal)   Pulse 72   Temp 97.6 F (36.4 C) (Oral)   Ht 5' 3.5" (1.613 m) Comment: no shoes  Wt 160 lb 8 oz (72.8 kg)   SpO2 97%   BMI 27.99 kg/m   Body mass index is 27.99 kg/m.  Advanced Directives 02/25/2017 12/30/2016 01/02/2016 11/13/2014 11/17/2011  Does Patient Have a Medical Advance Directive? Yes Yes Yes Yes Patient does not have advance directive;Patient would not like information  Type of Public librarianAdvance Directive Healthcare Power of CarrierAttorney;Living will Healthcare Power of State Street Corporationttorney Healthcare Power of [employer] Corporationttorney Healthcare Power of [place] TownAttorney -  Does patient want to make changes to medical advance directive? - - - No - Patient declined -  Copy of Healthcare Power of Attorney in Chart? No - copy requested - No - copy requested No - copy requested -  Would patient like information on creating a medical advance directive? - - - No - patient declined information -    Tobacco Social History   Tobacco Use  Smoking Status Never Smoker  Smokeless Tobacco Never Used     Counseling given: No   Clinical Intake:  Pre-visit preparation completed: Yes  Pain : No/denies pain Pain Score: 0-No pain     Nutritional Status: BMI of 19-24  Normal Nutritional Risks: None Diabetes: No  How often do you need to have someone help you when you read instructions, pamphlets, or other written materials from your doctor or pharmacy?: 1 - Never What is the last grade level you completed in school?: 12th grade  Interpreter Needed?: No  Comments: pt lives alone Information entered by :: LPinson, LPN  Past Medical History:  Diagnosis Date  . Chronic pain   .  DDD (degenerative disc disease)   . Diverticulosis   . Hyperlipidemia   . Hypertension   . Osteopenia   . Stroke (HCC)   . Syncope    Past Surgical History:  Procedure Laterality Date  . ABDOMINAL HYSTERECTOMY    . carotid doppler     normal  . DOPPLER ECHOCARDIOGRAPHY     normal  . EXPLORATORY LAPAROTOMY     2 times both neg   . LAMINECTOMY  06-2003   l2-l4  . SPINE SURGERY     l3-l4 microdiscectomy   . TONSILLECTOMY     Family History  Problem Relation Age of Onset  . Hypertension Father   . COPD Father   . Other Mother        Deceased, 1894  . Healthy Brother    Social History   Socioeconomic History  . Marital status: Single    Spouse name: None  . Number of children: None  . Years of education: None  . Highest education level: None  Social Needs  . Financial resource strain: None  . Food insecurity - worry: None  . Food insecurity - inability: None  . Transportation needs - medical: None  . Transportation needs - non-medical: None  Occupational History  . Occupation: retired  Tobacco Use  . Smoking status: Never Smoker  . Smokeless tobacco: Never Used  Substance and Sexual Activity  . Alcohol use: No    Alcohol/week: 0.0 oz  .  Drug use: No  . Sexual activity: No  Other Topics Concern  . None  Social History Narrative   She lives alone in a [place] mobile home.   She worked as a Teacher, early years/pre at [employer] level of education:  12th grade    Outpatient Encounter Medications as of 02/25/2017  Medication Sig  . Ascorbic Acid (VITAMIN C PO) Take 1 tablet by mouth.  Marland Kitchen atorvastatin (LIPITOR) 40 MG tablet Take 1 tablet (40 mg total) by mouth at bedtime.  . Brinzolamide-Brimonidine (SIMBRINZA) 1-0.2 % SUSP Place 1 drop into the right eye 3 (three) times daily.   . Cholecalciferol (VITAMIN D PO) Take 1 tablet by mouth daily.  . clopidogrel (PLAVIX) 75 MG tablet Take 1 tablet (75 mg total) by mouth daily.  .  Cyanocobalamin (VITAMIN B-12 IJ) Inject as directed See admin instructions. Inject every 2 months - last injection August 2016  . cyanocobalamin 1000 MCG tablet Take 1,000 mcg by mouth daily.  . fish oil-omega-3 fatty acids 1000 MG capsule Take 1 g by mouth daily.  Marland Kitchen gabapentin (NEURONTIN) 300 MG capsule Take 1 tablet at bedtime  . hydrochlorothiazide (HYDRODIURIL) 25 MG tablet Take 1 tablet (25 mg total) by mouth daily.  Marland Kitchen losartan (COZAAR) 100 MG tablet TAKE 1/2 TABLET (50 MG TOTAL) BY MOUTH DAILY.  Marland Kitchen sertraline (ZOLOFT) 50 MG tablet TAKE 1 TABLET (50 MG TOTAL) BY MOUTH DAILY.  . [EXPIRED] cyanocobalamin ((VITAMIN B-12)) injection 1,000 mcg    No facility-administered encounter medications on file as of 02/25/2017.     Activities of Daily Living In your present state of health, do you have any difficulty performing the following activities: 02/25/2017  Hearing? Y  Vision? Y  Comment vision loss in right eye  Difficulty concentrating or making decisions? Y  Walking or climbing stairs? Y  Dressing or bathing? N  Doing errands, shopping? Y  Preparing Food and eating ? N  Using the Toilet? N  In the past six months, have you accidently leaked urine? N  Do you have problems with loss of bowel control? N  Managing your Medications? Y  Managing your Finances? Y  Housekeeping or managing your Housekeeping? N  Some recent data might be hidden    Patient Care Team: Tower, Audrie Gallus, MD as PCP - General (Family Medicine) Glendale Chard, DO as Consulting Physician (Neurology) Stephannie Li, MD as Consulting Physician (Ophthalmology)    Assessment:   This is a routine wellness examination for [name].  Hearing Screening Comments: Bilateral hearing aids Vision Screening Comments: Vision exam in Jan 2019 with Dr. Lelon Perla   Exercise Activities and Dietary recommendations Current Exercise Habits: The patient does not participate in regular exercise at present, Exercise limited by: None  identified  Goals    . Follow up with Primary Care Provider     Starting 02/25/2017, I will continue to take medications as prescribed and to keep appointments with PCP as scheduled.        Fall Risk Fall Risk  02/25/2017 07/26/2016 01/02/2016 07/27/2015 03/18/2014  Falls in the past year? No No No No Yes  Number falls in past yr: - - - - 2 or more   Depression Screen PHQ 2/9 Scores 02/25/2017 01/02/2016 09/27/2014 03/18/2014  PHQ - 2 Score 0 1 5 1   PHQ- 9 Score 3 - 13 -     Cognitive Function MMSE - Mini Mental State Exam 02/25/2017 01/02/2016  Orientation to time 5 5  Orientation to Place 5 5  Registration 3 3  Attention/ Calculation 0 0  Recall 3 3  Language- name 2 objects 0 0  Language- repeat 1 1  Language- follow 3 step command 3 3  Language- read & follow direction 0 0  Write a sentence 0 0  Copy design 0 0  Total score 20 20   Montreal Cognitive Assessment  07/26/2016  Visuospatial/ Executive (0/5) 2  Naming (0/3) 3  Attention: Read list of digits (0/2) 2  Attention: Read list of letters (0/1) 1  Attention: Serial 7 subtraction starting at 100 (0/3) 0  Language: Repeat phrase (0/2) 1  Language : Fluency (0/1) 0  Abstraction (0/2) 1  Delayed Recall (0/5) 1  Orientation (0/6) 6  Total 17  Adjusted Score (based on education) 18     PLEASE NOTE: A Mini-Cog screen was completed. Maximum score is 20. A value of 0 denotes this part of Folstein MMSE was not completed or the patient failed this part of the Mini-Cog screening.   Mini-Cog Screening Orientation to Time - Max 5 pts Orientation to Place - Max 5 pts Registration - Max 3 pts Recall - Max 3 pts Language Repeat - Max 1 pts Language Follow 3 Step Command - Max 3 pts   Immunization History  Administered Date(s) Administered  . Influenza Split 11/18/2011  . Influenza Whole 11/10/2007, 11/30/2008, 11/07/2009  . Influenza,inj,Quad PF,6+ Mos 11/12/2012, 11/05/2013, 12/06/2014, 01/02/2016, 10/16/2016  .  Pneumococcal Conjugate-13 03/18/2014  . Pneumococcal Polysaccharide-23 11/18/2011   Screening Tests Health Maintenance  Topic Date Due  . MAMMOGRAM  01/01/2026 (Originally 02/22/2015)  . TETANUS/TDAP  01/01/2026 (Originally 03/12/1949)  . INFLUENZA VACCINE  Completed  . DEXA SCAN  Completed  . PNA vac Low Risk Adult  Completed      Plan:     I have personally reviewed, addressed, and noted the following in the patient's chart:  A. Medical and social history B. Use of alcohol, tobacco or illicit drugs  C. Current medications and supplements D. Functional ability and status E.  Nutritional status F.  Physical activity G. Advance directives H. List of other physicians I.  Hospitalizations, surgeries, and ER visits in previous 12 months J.  Vitals K. Screenings to include hearing, vision, cognitive, depression L. Referrals and appointments - none  In addition, I have reviewed and discussed with patient certain preventive protocols, quality metrics, and best practice recommendations. A written personalized care plan for preventive services as well as general preventive health recommendations were provided to patient.  See attached scanned questionnaire for additional information.   Signed,   Randa Evens, MHA, BS, LPN Health Coach

## 2017-02-25 NOTE — Patient Instructions (Addendum)
I do not think driving is safe with your vision - it is time to give up your licence  I will send a note to your eye surgeon about holding plavix    For safety-please wear your hearing aides   Labs today - we will let you know when results come back    Don't go without your cane ! A pocketbook with a cross body strap will make that easier   Get batteries for her hearing aides   A retirement community would be great for you -socialization is important   Check with your pharmacy about getting a tetanus shot   There is a shingles vaccine in the pharmacies called Shingrix  See if you can get on a waiting list  It is a good idea

## 2017-02-25 NOTE — Progress Notes (Signed)
Pre visit review using our clinic review tool, if applicable. No additional management support is needed unless otherwise documented below in the visit note. 

## 2017-02-25 NOTE — Patient Instructions (Addendum)
[name] , Thank you for taking time to come for your Medicare Wellness Visit. I appreciate your ongoing commitment to your health goals. Please review the following plan we discussed and let me know if I can assist you in the future.   These are the goals we discussed: Goals    . Follow up with Primary Care Provider     Starting 02/25/2017, I will continue to take medications as prescribed and to keep appointments with PCP as scheduled.        This is a list of the screening recommended for you and due dates:  Health Maintenance  Topic Date Due  . Mammogram  01/01/2026*  . Tetanus Vaccine  01/01/2026*  . Flu Shot  Completed  . DEXA scan (bone density measurement)  Completed  . Pneumonia vaccines  Completed  *Topic was postponed. The date shown is not the original due date.   Preventive Care for Adults  A healthy lifestyle and preventive care can promote health and wellness. Preventive health guidelines for adults include the following key practices.  . A routine yearly physical is a good way to check with your health care provider about your health and preventive screening. It is a chance to share any concerns and updates on your health and to receive a thorough exam.  . Visit your dentist for a routine exam and preventive care every 6 months. Brush your teeth twice a day and floss once a day. Good oral hygiene prevents tooth decay and gum disease.  . The frequency of eye exams is based on your age, health, family medical history, use  of contact lenses, and other factors. Follow your health care provider's recommendations for frequency of eye exams.  . Eat a healthy diet. Foods like vegetables, fruits, whole grains, low-fat dairy products, and lean protein foods contain the nutrients you need without too many calories. Decrease your intake of foods high in solid fats, added sugars, and salt. Eat the right amount of calories for you. Get information about a proper diet from your  health care provider, if necessary.  . Regular physical exercise is one of the most important things you can do for your health. Most adults should get at least 150 minutes of moderate-intensity exercise (any activity that increases your heart rate and causes you to sweat) each week. In addition, most adults need muscle-strengthening exercises on 2 or more days a week.  Silver Sneakers may be a benefit available to you. To determine eligibility, you may visit the website: www.silversneakers.com or contact program at 639-379-60001-7741364597 Mon-Fri between 8AM-8PM.   . Maintain a healthy weight. The body mass index (BMI) is a screening tool to identify possible weight problems. It provides an estimate of body fat based on height and weight. Your health care provider can find your BMI and can help you achieve or maintain a healthy weight.   For adults 20 years and older: ? A BMI below 18.5 is considered underweight. ? A BMI of 18.5 to 24.9 is normal. ? A BMI of 25 to 29.9 is considered overweight. ? A BMI of 30 and above is considered obese.   . Maintain normal blood lipids and cholesterol levels by exercising and minimizing your intake of saturated fat. Eat a balanced diet with plenty of fruit and vegetables. Blood tests for lipids and cholesterol should begin at age 82 and be repeated every 5 years. If your lipid or cholesterol levels are high, you are over 50, or you are  at high risk for heart disease, you may need your cholesterol levels checked more frequently. Ongoing high lipid and cholesterol levels should be treated with medicines if diet and exercise are not working.  . If you smoke, find out from your health care provider how to quit. If you do not use tobacco, please do not start.  . If you choose to drink alcohol, please do not consume more than 2 drinks per day. One drink is considered to be 12 ounces (355 mL) of beer, 5 ounces (148 mL) of wine, or 1.5 ounces (44 mL) of liquor.  . If you are  71-63 years old, ask your health care provider if you should take aspirin to prevent strokes.  . Use sunscreen. Apply sunscreen liberally and repeatedly throughout the day. You should seek shade when your shadow is shorter than you. Protect yourself by wearing long sleeves, pants, a wide-brimmed hat, and sunglasses year round, whenever you are outdoors.  . Once a month, do a whole body skin exam, using a mirror to look at the skin on your back. Tell your health care provider of new moles, moles that have irregular borders, moles that are larger than a pencil eraser, or moles that have changed in shape or color.

## 2017-02-26 NOTE — Assessment & Plan Note (Signed)
Check ca and D today  Stressed the imp of D supplementation  Declines dexa

## 2017-02-26 NOTE — Assessment & Plan Note (Signed)
Declines further eval or tx  No fractures  High fall risk  Enc vit D supplementation

## 2017-02-26 NOTE — Assessment & Plan Note (Signed)
bp in fair control at this time  BP Readings from Last 1 Encounters:  02/25/17 110/80   No changes needed Disc lifstyle change with low sodium diet and exercise  Labs ordered

## 2017-02-26 NOTE — Assessment & Plan Note (Signed)
Pt either refuses to put in hearing aides or has trouble doing so  At risk for falls/etc  Mimics dementia  D/w family

## 2017-02-26 NOTE — Assessment & Plan Note (Addendum)
Pt will have to hold plavix for mandatory eye surgery -risk of stroke will go up  She and family voice understanding Bp/ cholesterol controlled Continue neuro f/u

## 2017-02-26 NOTE — Assessment & Plan Note (Signed)
Lipid panel today  On statin and diet  H/o cva

## 2017-02-26 NOTE — Assessment & Plan Note (Signed)
Lab today  Enc water intake

## 2017-02-26 NOTE — Assessment & Plan Note (Signed)
Now with hemorrhage For surgery soon  Will have to hold plavix

## 2017-02-26 NOTE — Assessment & Plan Note (Signed)
Reviewed health habits including diet and exercise and skin cancer prevention Reviewed appropriate screening tests for age  Also reviewed health mt list, fam hx and immunization status , as well as social and family history    See HPI amw rev  Labs today  Avs: I do not think driving is safe with your vision - it is time to give up your licence  I will send a note to your eye surgeon about holding plavix    For safety-please wear your hearing aides   Labs today - we will let you know when results come back    Don't go without your cane ! A pocketbook with a cross body strap will make that easier   Get batteries for her hearing aides   A retirement community would be great for you -socialization is important   Check with your pharmacy about getting a tetanus shot   There is a shingles vaccine in the pharmacies called Shingrix  See if you can get on a waiting list  It is a good idea

## 2017-02-26 NOTE — Assessment & Plan Note (Signed)
Inj q 2 mo  Level today

## 2017-03-06 DIAGNOSIS — H348112 Central retinal vein occlusion, right eye, stable: Secondary | ICD-10-CM | POA: Diagnosis not present

## 2017-03-06 DIAGNOSIS — H348111 Central retinal vein occlusion, right eye, with retinal neovascularization: Secondary | ICD-10-CM | POA: Diagnosis not present

## 2017-03-06 DIAGNOSIS — H4051X3 Glaucoma secondary to other eye disorders, right eye, severe stage: Secondary | ICD-10-CM | POA: Diagnosis not present

## 2017-03-06 DIAGNOSIS — H4311 Vitreous hemorrhage, right eye: Secondary | ICD-10-CM | POA: Diagnosis not present

## 2017-03-06 DIAGNOSIS — H4051X2 Glaucoma secondary to other eye disorders, right eye, moderate stage: Secondary | ICD-10-CM | POA: Diagnosis not present

## 2017-03-14 DIAGNOSIS — H348112 Central retinal vein occlusion, right eye, stable: Secondary | ICD-10-CM | POA: Diagnosis not present

## 2017-04-02 DIAGNOSIS — H34811 Central retinal vein occlusion, right eye, with macular edema: Secondary | ICD-10-CM | POA: Diagnosis not present

## 2017-07-02 DIAGNOSIS — H34811 Central retinal vein occlusion, right eye, with macular edema: Secondary | ICD-10-CM | POA: Diagnosis not present

## 2017-07-02 DIAGNOSIS — H35362 Drusen (degenerative) of macula, left eye: Secondary | ICD-10-CM | POA: Diagnosis not present

## 2017-07-02 DIAGNOSIS — H3581 Retinal edema: Secondary | ICD-10-CM | POA: Diagnosis not present

## 2017-07-02 DIAGNOSIS — H4051X2 Glaucoma secondary to other eye disorders, right eye, moderate stage: Secondary | ICD-10-CM | POA: Diagnosis not present

## 2017-07-28 ENCOUNTER — Encounter: Payer: Self-pay | Admitting: Neurology

## 2017-07-28 ENCOUNTER — Ambulatory Visit: Payer: Medicare Other | Admitting: Neurology

## 2017-07-28 VITALS — BP 120/80 | HR 57 | Ht 63.0 in | Wt 160.0 lb

## 2017-07-28 DIAGNOSIS — G629 Polyneuropathy, unspecified: Secondary | ICD-10-CM | POA: Diagnosis not present

## 2017-07-28 DIAGNOSIS — I671 Cerebral aneurysm, nonruptured: Secondary | ICD-10-CM

## 2017-07-28 NOTE — Progress Notes (Signed)
Follow-up Visit   Date: 07/28/17    [name] MRN: [id number] DOB: 1930-10-30   Interim History: [name] is a 84 y.o. right-handed Caucasian female with history of hypertension, stroke (2013, manifesting with dysarthria and left homonymous hemianopia, no residual symptoms), hypertension, and hyperlipidemia returning to the clinic for follow-up cerebral aneurysm and memory impairment. The patient was accompanied to the clinic by niece (Tammy) who also provides collateral information.    History of present illness: Around May 2015, she developed numbness and tingling of the fingertips which initially started on the left hand and then involved the right. Symptoms are worse in the morning and improves as the day goes on. She denies any exacerbating/alleviating factors. She denies any weakness of the hands or neck pain. No numbness/tingling of the feet. She has difficulty with walking and loss of balance, especially when on uneven surfaces.  No recent falls because she is extra cautious.  NCS/EMG of the upper extremities was normal.   She is taking both gabapentin 300mg  and nortriptyline 10mg , but was supposed to stop gabapentin due to ?drug-related rash, but forgot to do.   UPDATE 07/25/2016:  Patient is here with her nephew for 1 year follow-up visit.  She denies any new neurological complaints.  Hand paresthesias are well-controlled on gabapentin 300mg  at bedtime.  Her nephew had noticed some concerns with her driving and that she does not wear her hearing aides, which makes communication difficult.  She usually only drives to church and the grocery store which is closeby. She continues to live independently, drive, and manage finances.  No interval falls or hospitalizations.  UPDATE 07/28/2017:  Patient is here with niece, Tammy.  She has been doing well over the past year, with no new medical illnesses, hospitalizations, or falls.  Tammy feels that her memory may be getting a little  worse, there was one occasion when she went to get her haircut and she could write a check without help.  Nearly all of her bills are electrically drafted.  Some of her memory impairment is stemming from her hearing impairment because she does not wear her hearing aides.  She has self-restricted driving to local distances only.  She is very independent and continues to do all her IALDs.  Her balance has deteriorated some, and even though it is emphasized, she rarely uses a cane. Mood, appetite, and sleep is good.   Medications:  Current Outpatient Medications on File Prior to Visit  Medication Sig Dispense Refill  . Ascorbic Acid (VITAMIN C PO) Take 1 tablet by mouth.    Marland Kitchen. atorvastatin (LIPITOR) 40 MG tablet Take 1 tablet (40 mg total) by mouth at bedtime. 90 tablet 3  . Brinzolamide-Brimonidine (SIMBRINZA) 1-0.2 % SUSP Place 1 drop into the right eye 3 (three) times daily.     . Cholecalciferol (VITAMIN D PO) Take 1 tablet by mouth daily.    . clopidogrel (PLAVIX) 75 MG tablet Take 1 tablet (75 mg total) by mouth daily. 90 tablet 3  . cyanocobalamin 1000 MCG tablet Take 1,000 mcg by mouth daily.    . fish oil-omega-3 fatty acids 1000 MG capsule Take 1 g by mouth daily.    . hydrochlorothiazide (HYDRODIURIL) 25 MG tablet Take 1 tablet (25 mg total) by mouth daily. 90 tablet 3  . losartan (COZAAR) 100 MG tablet TAKE 1/2 TABLET (50 MG TOTAL) BY MOUTH DAILY. 45 tablet 3  . sertraline (ZOLOFT) 50 MG tablet TAKE 1 TABLET (50 MG  TOTAL) BY MOUTH DAILY. 90 tablet 3   No current facility-administered medications on file prior to visit.     Allergies:  Allergies  Allergen Reactions  . Celecoxib Other (See Comments)    affects kidneys  . Codeine Nausea Only and Other (See Comments)    dizziness  . Lisinopril Cough  . Etodolac Rash     Review of Systems:  CONSTITUTIONAL: No fevers, chills, night sweats, or weight loss.   EYES: No visual changes or eye pain ENT: No hearing changes.  No history  of nose bleeds.   RESPIRATORY: No cough, wheezing and shortness of breath.   CARDIOVASCULAR: Negative for chest pain, and palpitations.   GI: Negative for abdominal discomfort, blood in stools or black stools.  No recent change in bowel habits.   GU:  No history of incontinence.   MUSCLOSKELETAL: No history of joint pain or swelling.  No myalgias.   SKIN: Negative for lesions,+ rash, and itching.   ENDOCRINE: Negative for cold or heat intolerance, polydipsia or goiter.   PSYCH:  No depression or anxiety symptoms.   NEURO: As Above.   Vital Signs:  BP 120/80   Pulse (!) 57   Ht 5\' 3"  (1.6 m)   Wt 160 lb (72.6 kg)   SpO2 97%   BMI 28.34 kg/m   Neurological Exam: MENTAL STATUS including orientation to time, place, person, recent and remote memory, attention span and concentration, language, and fund of knowledge is fair, somewhat limited by her hearing deficits.  Speech is not dysarthric.  Montreal Cognitive Assessment  07/28/2017 07/26/2016  Visuospatial/ Executive (0/5) 3 2  Naming (0/3) 1 3  Attention: Read list of digits (0/2) 1 2  Attention: Read list of letters (0/1) 1 1  Attention: Serial 7 subtraction starting at 100 (0/3) 1 0  Language: Repeat phrase (0/2) 0 1  Language : Fluency (0/1) 0 0  Abstraction (0/2) 1 1  Delayed Recall (0/5) 0 1  Orientation (0/6) 3 6  Total 11 17  Adjusted Score (based on education) 12 18   CRANIAL NERVES:   Face is symmetric.  Poor hearing bilaterally.  MOTOR:  Motor strength is 5/5 in all extremities, except bilateral ABP 5-/5.    MSRs:  Reflexes are 2+/4 in the upper extremities and 1+ in the lower extremities throughout.  SENSATION:  Absent vibration in the legs, intact in the arms  COORDINATION/GAIT: Gait appears ataxic and unsteady, assisted with cane   Data: EMG of the upper extremities 09/15/2013: This is a normal study of the right and left upper extremities. In particular, there is no evidence of generalized sensorimotor  polyneuropathy, cervical radiculopathy, or carpal tunnel syndrome.  MRI brain wo contrast 11/17/2011:  No acute or subacute infarction.  Old inferior cerebellar infarction on the left.  Chronic small vessel changes of the pons in the cerebral hemispheric white matter, fairly typical for age.   MRA head 11/17/2011:  No major vessel occlusion.  No significant anterior circulation stenotic disease.  40% stenosis of the basilar artery in the proximal third.  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   MRA head 08/12/2016:  Stable intracranial MRA including 4 mm right superior hypophyseal region ICA aneurysm.  IMPRESSION/PLAN:   Worsening cognitive impairment based on [facility] 12/30, some of which is due to poor hearing and possibly early dementia.  Patient still lives independently and manages her own ADLs and IADLs With her hearing and cognitive changes, I  do not recommend driving.  Understandably, this is very difficult to grasp as she is very independent and lives alone Her niece, Babette Relic, is her POA.  Recommend that there is more oversight of her financial and personal affairs Strongly encouraged her to wear hearing aides  Paresthesias of the hands - resolved.  Discontinue gabapentin  Large fiber neuropathy affecting the feet, likely idiopathic, and contributing to sensory ataxia/gait instability  - stable.  Worsening gait ataxia, recommend that she use a rollator.   History of left posterior frontal and parietal infarction (2010), no residual deficits.  Continue plavix and secondary risk prevention   Right cavernous carotid 4mm aneurysm.  Check MRA head, if this remains stable will reassess every 18 months.  Return to clinic in 1 year   Greater than 50% of this 30 minute visit was spent in counseling, explanation of diagnosis, planning of further management, and coordination of care.    Thank you for allowing me to participate in patient's care.  If  I can answer any additional questions, I would be pleased to do so.    Sincerely,    Donika K. Allena Katz, DO

## 2017-07-28 NOTE — Patient Instructions (Addendum)
Stop gabapentin  MRA head   Please start using a 4-wheeled rollator  Return to clinic in 1 year

## 2017-08-03 ENCOUNTER — Ambulatory Visit
Admission: RE | Admit: 2017-08-03 | Discharge: 2017-08-03 | Disposition: A | Discharge status: Home / Self Care | Payer: Medicare Other | Source: Ambulatory Visit | Attending: Neurology | Admitting: Neurology

## 2017-08-03 DIAGNOSIS — I671 Cerebral aneurysm, nonruptured: Secondary | ICD-10-CM

## 2017-08-03 DIAGNOSIS — G629 Polyneuropathy, unspecified: Secondary | ICD-10-CM

## 2017-08-04 ENCOUNTER — Telehealth: Payer: Self-pay | Admitting: *Deleted

## 2017-08-04 ENCOUNTER — Encounter: Payer: Self-pay | Admitting: *Deleted

## 2017-08-04 NOTE — Telephone Encounter (Signed)
-----   Message from [place] Chardonika K Patel, DO sent at 08/04/2017 10:02 AM EDT ----- Please inform patient that her aneurysm is stable, no change in size.  Thanks.

## 2017-08-04 NOTE — Telephone Encounter (Signed)
Results sent via My Chart.  

## 2017-10-17 ENCOUNTER — Encounter: Payer: Self-pay | Admitting: Neurology

## 2017-10-22 ENCOUNTER — Encounter: Payer: Self-pay | Admitting: Family Medicine

## 2017-11-01 ENCOUNTER — Encounter: Payer: Self-pay | Admitting: Family Medicine

## 2017-11-24 ENCOUNTER — Emergency Department: Payer: Medicare Other

## 2017-11-24 ENCOUNTER — Other Ambulatory Visit: Payer: Self-pay

## 2017-11-24 ENCOUNTER — Encounter: Payer: Self-pay | Admitting: Emergency Medicine

## 2017-11-24 ENCOUNTER — Emergency Department
Admission: EM | Admit: 2017-11-24 | Discharge: 2017-11-24 | Disposition: A | Discharge status: Other Acute Inpatient Hospital | Payer: Medicare Other | Attending: Emergency Medicine | Admitting: Emergency Medicine

## 2017-11-24 DIAGNOSIS — Y92008 Other place in unspecified non-institutional (private) residence as the place of occurrence of the external cause: Secondary | ICD-10-CM | POA: Insufficient documentation

## 2017-11-24 DIAGNOSIS — S299XXA Unspecified injury of thorax, initial encounter: Secondary | ICD-10-CM | POA: Diagnosis not present

## 2017-11-24 DIAGNOSIS — G4700 Insomnia, unspecified: Secondary | ICD-10-CM | POA: Diagnosis not present

## 2017-11-24 DIAGNOSIS — S0081XA Abrasion of other part of head, initial encounter: Secondary | ICD-10-CM | POA: Diagnosis not present

## 2017-11-24 DIAGNOSIS — S12190A Other displaced fracture of second cervical vertebra, initial encounter for closed fracture: Secondary | ICD-10-CM | POA: Diagnosis not present

## 2017-11-24 DIAGNOSIS — J9811 Atelectasis: Secondary | ICD-10-CM | POA: Diagnosis not present

## 2017-11-24 DIAGNOSIS — S12100A Unspecified displaced fracture of second cervical vertebra, initial encounter for closed fracture: Secondary | ICD-10-CM

## 2017-11-24 DIAGNOSIS — M7989 Other specified soft tissue disorders: Secondary | ICD-10-CM | POA: Diagnosis not present

## 2017-11-24 DIAGNOSIS — S022XXD Fracture of nasal bones, subsequent encounter for fracture with routine healing: Secondary | ICD-10-CM | POA: Diagnosis not present

## 2017-11-24 DIAGNOSIS — S80812A Abrasion, left lower leg, initial encounter: Secondary | ICD-10-CM | POA: Diagnosis not present

## 2017-11-24 DIAGNOSIS — I10 Essential (primary) hypertension: Secondary | ICD-10-CM | POA: Diagnosis not present

## 2017-11-24 DIAGNOSIS — Z7902 Long term (current) use of antithrombotics/antiplatelets: Secondary | ICD-10-CM | POA: Diagnosis not present

## 2017-11-24 DIAGNOSIS — M6281 Muscle weakness (generalized): Secondary | ICD-10-CM | POA: Diagnosis not present

## 2017-11-24 DIAGNOSIS — S32454D Nondisplaced transverse fracture of right acetabulum, subsequent encounter for fracture with routine healing: Secondary | ICD-10-CM | POA: Diagnosis not present

## 2017-11-24 DIAGNOSIS — E559 Vitamin D deficiency, unspecified: Secondary | ICD-10-CM | POA: Diagnosis not present

## 2017-11-24 DIAGNOSIS — S12491D Other nondisplaced fracture of fifth cervical vertebra, subsequent encounter for fracture with routine healing: Secondary | ICD-10-CM | POA: Diagnosis not present

## 2017-11-24 DIAGNOSIS — S32019A Unspecified fracture of first lumbar vertebra, initial encounter for closed fracture: Secondary | ICD-10-CM | POA: Diagnosis not present

## 2017-11-24 DIAGNOSIS — S3991XA Unspecified injury of abdomen, initial encounter: Secondary | ICD-10-CM | POA: Diagnosis not present

## 2017-11-24 DIAGNOSIS — S12030D Displaced posterior arch fracture of first cervical vertebra, subsequent encounter for fracture with routine healing: Secondary | ICD-10-CM | POA: Diagnosis not present

## 2017-11-24 DIAGNOSIS — B9620 Unspecified Escherichia coli [E. coli] as the cause of diseases classified elsewhere: Secondary | ICD-10-CM | POA: Diagnosis not present

## 2017-11-24 DIAGNOSIS — R270 Ataxia, unspecified: Secondary | ICD-10-CM | POA: Diagnosis not present

## 2017-11-24 DIAGNOSIS — R5381 Other malaise: Secondary | ICD-10-CM | POA: Diagnosis not present

## 2017-11-24 DIAGNOSIS — M4312 Spondylolisthesis, cervical region: Secondary | ICD-10-CM | POA: Diagnosis not present

## 2017-11-24 DIAGNOSIS — N289 Disorder of kidney and ureter, unspecified: Secondary | ICD-10-CM | POA: Diagnosis not present

## 2017-11-24 DIAGNOSIS — S12030A Displaced posterior arch fracture of first cervical vertebra, initial encounter for closed fracture: Secondary | ICD-10-CM | POA: Diagnosis not present

## 2017-11-24 DIAGNOSIS — I720 Aneurysm of carotid artery: Secondary | ICD-10-CM | POA: Diagnosis not present

## 2017-11-24 DIAGNOSIS — W108XXA Fall (on) (from) other stairs and steps, initial encounter: Secondary | ICD-10-CM | POA: Insufficient documentation

## 2017-11-24 DIAGNOSIS — S12111A Posterior displaced Type II dens fracture, initial encounter for closed fracture: Secondary | ICD-10-CM | POA: Diagnosis not present

## 2017-11-24 DIAGNOSIS — S12101A Unspecified nondisplaced fracture of second cervical vertebra, initial encounter for closed fracture: Secondary | ICD-10-CM | POA: Diagnosis not present

## 2017-11-24 DIAGNOSIS — S0990XA Unspecified injury of head, initial encounter: Secondary | ICD-10-CM | POA: Diagnosis not present

## 2017-11-24 DIAGNOSIS — M85862 Other specified disorders of bone density and structure, left lower leg: Secondary | ICD-10-CM | POA: Diagnosis not present

## 2017-11-24 DIAGNOSIS — Y999 Unspecified external cause status: Secondary | ICD-10-CM | POA: Diagnosis not present

## 2017-11-24 DIAGNOSIS — S12101D Unspecified nondisplaced fracture of second cervical vertebra, subsequent encounter for fracture with routine healing: Secondary | ICD-10-CM | POA: Diagnosis not present

## 2017-11-24 DIAGNOSIS — Y939 Activity, unspecified: Secondary | ICD-10-CM | POA: Diagnosis not present

## 2017-11-24 DIAGNOSIS — S12490A Other displaced fracture of fifth cervical vertebra, initial encounter for closed fracture: Secondary | ICD-10-CM | POA: Diagnosis not present

## 2017-11-24 DIAGNOSIS — F99 Mental disorder, not otherwise specified: Secondary | ICD-10-CM | POA: Diagnosis not present

## 2017-11-24 DIAGNOSIS — S1202XA Unstable burst fracture of first cervical vertebra, initial encounter for closed fracture: Secondary | ICD-10-CM | POA: Diagnosis not present

## 2017-11-24 DIAGNOSIS — S12300A Unspecified displaced fracture of fourth cervical vertebra, initial encounter for closed fracture: Secondary | ICD-10-CM | POA: Diagnosis not present

## 2017-11-24 DIAGNOSIS — Z79899 Other long term (current) drug therapy: Secondary | ICD-10-CM | POA: Diagnosis not present

## 2017-11-24 DIAGNOSIS — Z23 Encounter for immunization: Secondary | ICD-10-CM | POA: Diagnosis not present

## 2017-11-24 DIAGNOSIS — R41841 Cognitive communication deficit: Secondary | ICD-10-CM | POA: Diagnosis not present

## 2017-11-24 DIAGNOSIS — S12111D Posterior displaced Type II dens fracture, subsequent encounter for fracture with routine healing: Secondary | ICD-10-CM | POA: Diagnosis not present

## 2017-11-24 DIAGNOSIS — S12090A Other displaced fracture of first cervical vertebra, initial encounter for closed fracture: Secondary | ICD-10-CM | POA: Diagnosis not present

## 2017-11-24 DIAGNOSIS — S199XXA Unspecified injury of neck, initial encounter: Secondary | ICD-10-CM | POA: Diagnosis not present

## 2017-11-24 DIAGNOSIS — E041 Nontoxic single thyroid nodule: Secondary | ICD-10-CM | POA: Diagnosis not present

## 2017-11-24 DIAGNOSIS — S12401A Unspecified nondisplaced fracture of fifth cervical vertebra, initial encounter for closed fracture: Secondary | ICD-10-CM | POA: Diagnosis not present

## 2017-11-24 DIAGNOSIS — S022XXA Fracture of nasal bones, initial encounter for closed fracture: Secondary | ICD-10-CM | POA: Diagnosis not present

## 2017-11-24 DIAGNOSIS — N390 Urinary tract infection, site not specified: Secondary | ICD-10-CM | POA: Diagnosis not present

## 2017-11-24 DIAGNOSIS — S3993XA Unspecified injury of pelvis, initial encounter: Secondary | ICD-10-CM | POA: Diagnosis not present

## 2017-11-24 DIAGNOSIS — R4182 Altered mental status, unspecified: Secondary | ICD-10-CM | POA: Diagnosis not present

## 2017-11-24 DIAGNOSIS — Z8673 Personal history of transient ischemic attack (TIA), and cerebral infarction without residual deficits: Secondary | ICD-10-CM | POA: Diagnosis not present

## 2017-11-24 DIAGNOSIS — S12491A Other nondisplaced fracture of fifth cervical vertebra, initial encounter for closed fracture: Secondary | ICD-10-CM | POA: Diagnosis not present

## 2017-11-24 DIAGNOSIS — K5730 Diverticulosis of large intestine without perforation or abscess without bleeding: Secondary | ICD-10-CM | POA: Diagnosis not present

## 2017-11-24 DIAGNOSIS — Z01818 Encounter for other preprocedural examination: Secondary | ICD-10-CM | POA: Diagnosis not present

## 2017-11-24 DIAGNOSIS — E213 Hyperparathyroidism, unspecified: Secondary | ICD-10-CM | POA: Diagnosis not present

## 2017-11-24 DIAGNOSIS — R000 Tachycardia, unspecified: Secondary | ICD-10-CM | POA: Diagnosis not present

## 2017-11-24 DIAGNOSIS — S12000A Unspecified displaced fracture of first cervical vertebra, initial encounter for closed fracture: Secondary | ICD-10-CM | POA: Diagnosis not present

## 2017-11-24 DIAGNOSIS — R279 Unspecified lack of coordination: Secondary | ICD-10-CM | POA: Diagnosis not present

## 2017-11-24 DIAGNOSIS — S129XXA Fracture of neck, unspecified, initial encounter: Secondary | ICD-10-CM | POA: Diagnosis not present

## 2017-11-24 DIAGNOSIS — S12090D Other displaced fracture of first cervical vertebra, subsequent encounter for fracture with routine healing: Secondary | ICD-10-CM | POA: Diagnosis not present

## 2017-11-24 DIAGNOSIS — E785 Hyperlipidemia, unspecified: Secondary | ICD-10-CM | POA: Diagnosis not present

## 2017-11-24 DIAGNOSIS — M542 Cervicalgia: Secondary | ICD-10-CM | POA: Diagnosis not present

## 2017-11-24 DIAGNOSIS — I4949 Other premature depolarization: Secondary | ICD-10-CM | POA: Diagnosis not present

## 2017-11-24 DIAGNOSIS — W19XXXD Unspecified fall, subsequent encounter: Secondary | ICD-10-CM | POA: Diagnosis not present

## 2017-11-24 DIAGNOSIS — S32018A Other fracture of first lumbar vertebra, initial encounter for closed fracture: Secondary | ICD-10-CM | POA: Diagnosis not present

## 2017-11-24 LAB — CBC WITH DIFFERENTIAL/PLATELET
Abs Immature Granulocytes: 0.04 10*3/uL (ref 0.00–0.07)
BASOS PCT: 0 %
Basophils Absolute: 0 10*3/uL (ref 0.0–0.1)
EOS ABS: 0.1 10*3/uL (ref 0.0–0.5)
Eosinophils Relative: 1 %
HCT: 38.6 % (ref 36.0–46.0)
Hemoglobin: 12.8 g/dL (ref 12.0–15.0)
Immature Granulocytes: 0 %
Lymphocytes Relative: 14 %
Lymphs Abs: 1.5 10*3/uL (ref 0.7–4.0)
MCH: 32 pg (ref 26.0–34.0)
MCHC: 33.2 g/dL (ref 30.0–36.0)
MCV: 96.5 fL (ref 80.0–100.0)
MONO ABS: 0.8 10*3/uL (ref 0.1–1.0)
MONOS PCT: 7 %
NEUTROS ABS: 8.2 10*3/uL — AB (ref 1.7–7.7)
NEUTROS PCT: 78 %
Platelets: 155 10*3/uL (ref 150–400)
RBC: 4 MIL/uL (ref 3.87–5.11)
RDW: 13.3 % (ref 11.5–15.5)
WBC: 10.6 10*3/uL — AB (ref 4.0–10.5)
nRBC: 0 % (ref 0.0–0.2)

## 2017-11-24 LAB — BASIC METABOLIC PANEL
ANION GAP: 9 (ref 5–15)
BUN: 14 mg/dL (ref 8–23)
CALCIUM: 10.3 mg/dL (ref 8.9–10.3)
CO2: 24 mmol/L (ref 22–32)
Chloride: 105 mmol/L (ref 98–111)
Creatinine, Ser: 0.72 mg/dL (ref 0.44–1.00)
GFR calc Af Amer: >60 mL/min (ref >60)
GFR calc non Af Amer: >60 mL/min (ref >60)
GLUCOSE: 104 mg/dL — AB (ref 70–99)
POTASSIUM: 3.9 mmol/L (ref 3.5–5.1)
SODIUM: 138 mmol/L (ref 135–145)

## 2017-11-24 LAB — PROTIME-INR
INR: 1.05
PROTHROMBIN TIME: 13.6 s (ref 11.4–15.2)

## 2017-11-24 MED ORDER — TETANUS-DIPHTH-ACELL PERTUSSIS 5-2.5-18.5 LF-MCG/0.5 IM SUSP
0.5000 mL | Freq: Once | INTRAMUSCULAR | Status: AC
  Administered 2017-11-24: 0.5 mL via INTRAMUSCULAR
  Filled 2017-11-24: qty 0.5

## 2017-11-24 NOTE — ED Notes (Signed)
Called [facility] transfer center spoke to [facility]   [phone]

## 2017-11-24 NOTE — ED Notes (Signed)
EMTALA and Medical Necessity documentation reviewed at this time and noted to be complete per policy. 

## 2017-11-24 NOTE — ED Provider Notes (Signed)
[facility] Emergency Department Provider Note  ____________________________________________  Time seen: Approximately 9:56 PM  I have reviewed the triage vital signs and the nursing notes.   HISTORY  Chief Complaint Fall and Neck Pain    HPI [name] is a 84 y.o. female with a history of hypertension who was in her usual state of health tonight when she had a slip and fall down several steps off her back porch.  She denies loss of consciousness but complains of high neck pain afterward.  Comes to the ED for evaluation.  Pain is constant, moderate intensity, nonradiating.  Worse with any neck movement.  Denies any paresthesias or weakness in the arms or legs.  No vision change.  Denies significant headache.   She has abrasions to the forehead as well as the left shin   Past Medical History:  Diagnosis Date  . Chronic pain   . DDD (degenerative disc disease)   . Diverticulosis   . Hyperlipidemia   . Hypertension   . Osteopenia   . Stroke (HCC)   . Syncope      Patient Active Problem List   Diagnosis Date Noted  . Hearing loss 01/09/2016  . Routine general medical examination at a health care facility 12/31/2015  . Dysuria 09/29/2015  . Anxiety and depression 09/21/2014  . Pedal edema 06/22/2014  . Encounter for Medicare annual wellness exam 03/18/2014  . Retinal vein occlusion 03/18/2014  . B12 deficiency 03/15/2014  . Colon cancer screening 04/19/2013  . At risk for falling 12/02/2011  . Hyperparathyroidism (HCC) 11/20/2011  . Back pain, thoracic 09/30/2011  . Scoliosis 09/30/2011  . Degenerative disc disease, lumbar 09/30/2011  . Renal insufficiency 05/27/2011  . Syncope 12/05/2010  . Cerebral artery occlusion with cerebral infarction (HCC) 02/26/2008  . Hyperlipidemia 05/12/2006  . Essential hypertension 05/12/2006  . DIVERTICULOSIS, COLON 05/12/2006  . Osteopenia 05/12/2006     Past Surgical History:  Procedure Laterality  Date  . ABDOMINAL HYSTERECTOMY    . carotid doppler     normal  . DOPPLER ECHOCARDIOGRAPHY     normal  . EXPLORATORY LAPAROTOMY     2 times both neg   . LAMINECTOMY  06-2003   l2-l4  . SPINE SURGERY     l3-l4 microdiscectomy   . TONSILLECTOMY       Prior to Admission medications   Medication Sig Start Date End Date Taking? Authorizing Provider  Ascorbic Acid (VITAMIN C PO) Take 1 tablet by mouth.    [provider]  atorvastatin (LIPITOR) 40 MG tablet Take 1 tablet (40 mg total) by mouth at bedtime. 02/25/17 02/25/18  Tower, Audrie Gallus, MD  Brinzolamide-Brimonidine [facility]) 1-0.2 % SUSP Place 1 drop into the right eye 3 (three) times daily.     [provider]  Cholecalciferol (VITAMIN D PO) Take 1 tablet by mouth daily.    [provider]  clopidogrel (PLAVIX) 75 MG tablet Take 1 tablet (75 mg total) by mouth daily. 02/25/17   Tower, Audrie Gallus, MD  cyanocobalamin 1000 MCG tablet Take 1,000 mcg by mouth daily.    [provider]  fish oil-omega-3 fatty acids 1000 MG capsule Take 1 g by mouth daily.    [provider]  hydrochlorothiazide (HYDRODIURIL) 25 MG tablet Take 1 tablet (25 mg total) by mouth daily. 02/25/17   Tower, Audrie Gallus, MD  losartan (COZAAR) 100 MG tablet TAKE 1/2 TABLET (50 MG TOTAL) BY MOUTH DAILY. 02/25/17   Tower, [place]  A, MD  sertraline (ZOLOFT) 50 MG tablet TAKE 1 TABLET (50 MG TOTAL) BY MOUTH DAILY. 02/25/17   Tower, Audrie Gallus, MD     Allergies Celecoxib; Codeine; Lisinopril; and Etodolac   Family History  Problem Relation Age of Onset  . Hypertension Father   . COPD Father   . Other Mother        Deceased, 24  . Healthy Brother     Social History Social History   Tobacco Use  . Smoking status: Never Smoker  . Smokeless tobacco: Never Used  Substance Use Topics  . Alcohol use: No    Alcohol/week: 0.0 standard drinks  . Drug use: No    Review of Systems  Constitutional:   No fever or chills.  ENT:   No  sore throat. No rhinorrhea. Cardiovascular:   No chest pain or syncope. Respiratory:   No dyspnea or cough. Gastrointestinal:   Negative for abdominal pain, vomiting and diarrhea.  Musculoskeletal:   Negative for focal pain or swelling All other systems reviewed and are negative except as documented above in ROS and HPI.  ____________________________________________   PHYSICAL EXAM:  VITAL SIGNS: ED Triage Vitals  Enc Vitals Group     BP 11/24/17 2007 (!) 164/98     Pulse Rate 11/24/17 2007 85     Resp 11/24/17 2007 18     Temp 11/24/17 2007 97.6 F (36.4 C)     Temp Source 11/24/17 2007 Axillary     SpO2 11/24/17 2007 96 %     Weight 11/24/17 2009 160 lb (72.6 kg)     Height 11/24/17 2009 5\' 3"  (1.6 m)     Head Circumference --      Peak Flow --      Pain Score 11/24/17 2008 5     Pain Loc --      Pain Edu? --      Excl. in GC? --     Vital signs reviewed, nursing assessments reviewed.   Constitutional:   Alert and oriented. Non-toxic appearance. Eyes:   Conjunctivae are normal. EOMI. PERRL. ENT      Head:   Normocephalic with large linear abrasion over the forehead.  No hemotympanum, raccoon eyes, or battle sign      Nose:   Small amount of dried blood in the left nostril.  No septal hematoma.  No epistaxis.       Mouth/Throat:   MMM, no pharyngeal erythema. No peritonsillar mass.  No intraoral injury      Neck: Cervical collar in place. Hematological/Lymphatic/Immunilogical:   No cervical lymphadenopathy. Cardiovascular:   RRR. Symmetric bilateral radial and DP pulses.  No murmurs. Cap refill less than 2 seconds. Respiratory:   Normal respiratory effort without tachypnea/retractions. Breath sounds are clear and equal bilaterally. No wheezes/rales/rhonchi. Gastrointestinal:   Soft and nontender. Non distended. There is no CVA tenderness.  No rebound, rigidity, or guarding. Genitourinary:   deferred Musculoskeletal:   Normal range of motion in all extremities. No joint  effusions.  No lower extremity tenderness.  No edema.  Long bones stable. Neurologic:   Normal speech and language.  Motor grossly intact. No acute focal neurologic deficits are appreciated.  Skin:    Skin is warm, dry with bilateral forehead abrasions and an abrasion over the left shin without lacerations.. No rash noted.  No petechiae, purpura, or bullae.  ____________________________________________    LABS (pertinent positives/negatives) (all labs ordered are listed, but only abnormal results are displayed) Labs Reviewed  BASIC METABOLIC PANEL  CBC WITH DIFFERENTIAL/PLATELET  PROTIME-INR   ____________________________________________   EKG    ____________________________________________    RADIOLOGY  Ct Head Wo Contrast  Result Date: 11/24/2017 CLINICAL DATA:  84 year old with fall. Patient is on Plavix. Neck pain. Ataxia. EXAM: CT HEAD WITHOUT CONTRAST CT CERVICAL SPINE WITHOUT CONTRAST TECHNIQUE: Multidetector CT imaging of the head and cervical spine was performed following the standard protocol without intravenous contrast. Multiplanar CT image reconstructions of the cervical spine were also generated. COMPARISON:  Head CT 12/30/2016 FINDINGS: CT HEAD FINDINGS Brain: Old infarct in the left cerebellum. Chronic low-density in the white matter. No evidence for acute hemorrhage, mass lesion, midline shift, hydrocephalus or large infarct. Stable atrophy. Vascular: No hyperdense vessel or unexpected calcification. Skull: No calvarial fracture. There is a new right nasal bone fracture. Sinuses/Orbits: Visualized paranasal sinuses and mastoid air cells are clear. Other: Postsurgical changes in the right orbit. C1 fracture and refer to the cervical spine CT. CT CERVICAL SPINE FINDINGS Alignment: 4 mm of anterolisthesis at C3-C4 and related to severe facet arthropathy. Mild anterolisthesis at C4-C5. Normal alignment at the cervicothoracic junction. Skull base and vertebrae: Mildly  displaced fracture involving the anterior arch of C1. Bilateral fractures along the posterior arch of C1. Nondisplaced fracture at the base of the dens. Nondisplaced fracture involving the C6 left transverse process. Mildly displaced fracture involving the C7 left transverse process. Possible fracture involving the C5 transverse process. Soft tissues and spinal canal: No visible canal hematoma. Question a small amount of paravertebral swelling. Disc levels: Anterolisthesis at C3-C4. Disc space narrowing at C5-C6. Upper chest: No acute abnormality. Other: None IMPRESSION: 1. Multiple cervical spine fractures. Nondisplaced type 2 dens fracture. Fractures through the anterior and posterior rings of C1. Fractures involving the C6 and C7 left transverse processes. Possible C5 spinous process fracture. 2. No acute intracranial abnormality. Stable atrophy with old ischemic changes. 3. Right nasal bone fracture of unknown age. These results were called by telephone at the time of interpretation on 11/24/2017 at 9:09 pm to Dr. Sharman Cheek , who verbally acknowledged these results. Electronically Signed   By: Richarda Overlie M.D.   On: 11/24/2017 21:15   Ct Cervical Spine Wo Contrast  Result Date: 11/24/2017 CLINICAL DATA:  84 year old with fall. Patient is on Plavix. Neck pain. Ataxia. EXAM: CT HEAD WITHOUT CONTRAST CT CERVICAL SPINE WITHOUT CONTRAST TECHNIQUE: Multidetector CT imaging of the head and cervical spine was performed following the standard protocol without intravenous contrast. Multiplanar CT image reconstructions of the cervical spine were also generated. COMPARISON:  Head CT 12/30/2016 FINDINGS: CT HEAD FINDINGS Brain: Old infarct in the left cerebellum. Chronic low-density in the white matter. No evidence for acute hemorrhage, mass lesion, midline shift, hydrocephalus or large infarct. Stable atrophy. Vascular: No hyperdense vessel or unexpected calcification. Skull: No calvarial fracture. There is a new  right nasal bone fracture. Sinuses/Orbits: Visualized paranasal sinuses and mastoid air cells are clear. Other: Postsurgical changes in the right orbit. C1 fracture and refer to the cervical spine CT. CT CERVICAL SPINE FINDINGS Alignment: 4 mm of anterolisthesis at C3-C4 and related to severe facet arthropathy. Mild anterolisthesis at C4-C5. Normal alignment at the cervicothoracic junction. Skull base and vertebrae: Mildly displaced fracture involving the anterior arch of C1. Bilateral fractures along the posterior arch of C1. Nondisplaced fracture at the base of the dens. Nondisplaced fracture involving the C6 left transverse process. Mildly displaced fracture involving the C7 left transverse process. Possible fracture involving the C5 transverse  process. Soft tissues and spinal canal: No visible canal hematoma. Question a small amount of paravertebral swelling. Disc levels: Anterolisthesis at C3-C4. Disc space narrowing at C5-C6. Upper chest: No acute abnormality. Other: None IMPRESSION: 1. Multiple cervical spine fractures. Nondisplaced type 2 dens fracture. Fractures through the anterior and posterior rings of C1. Fractures involving the C6 and C7 left transverse processes. Possible C5 spinous process fracture. 2. No acute intracranial abnormality. Stable atrophy with old ischemic changes. 3. Right nasal bone fracture of unknown age. These results were called by telephone at the time of interpretation on 11/24/2017 at 9:09 pm to Dr. Sharman Cheek , who verbally acknowledged these results. Electronically Signed   By: Richarda Overlie M.D.   On: 11/24/2017 21:15    ____________________________________________   PROCEDURES .Critical Care Performed by: Sharman Cheek, MD Authorized by: Sharman Cheek, MD   Critical care provider statement:    Critical care time (minutes):  30   Critical care time was exclusive of:  Separately billable procedures and treating other patients   Critical care was  necessary to treat or prevent imminent or life-threatening deterioration of the following conditions:  Trauma and CNS failure or compromise   Critical care was time spent personally by me on the following activities:  Development of treatment plan with patient or surrogate, discussions with consultants, evaluation of patient's response to treatment, examination of patient, obtaining history from patient or surrogate, ordering and performing treatments and interventions, ordering and review of laboratory studies, ordering and review of radiographic studies, pulse oximetry, re-evaluation of patient's condition and review of old charts    ____________________________________________  DIFFERENTIAL DIAGNOSIS   Intracranial hemorrhage, C-spine fracture  CLINICAL IMPRESSION / ASSESSMENT AND PLAN / ED COURSE  Pertinent labs & imaging results that were available during my care of the patient were reviewed by me and considered in my medical decision making (see chart for details).    Patient presents with head and neck trauma after a fall.  No loss of consciousness but she is having a lot of neck pain.  A cervical collar was placed on arrival to the ED.  CT scans were obtained.  I discussed results with the radiologist who confirms that she has C1 fracture anteriorly and bilateral posterior fractures, dens fracture, and several other spinous process and TP fractures of the lower C-spine.  Indicative of an unstable fracture.  She is neurologically intact at this time, GCS 15, vital signs are unremarkable.  Not requiring airway protection at this time.  Case discussed with the ED attending at [facility] who notes that she meets yellow trauma criteria and accepts for transfer for further evaluation and neurosurgery consultation.  The findings were discussed with the patient and niece is at bedside.  I will give her a tetanus shot today.      ____________________________________________   FINAL CLINICAL  IMPRESSION(S) / ED DIAGNOSES    Final diagnoses:  Closed unstable burst fracture of first cervical vertebra, initial encounter (HCC)  Closed odontoid fracture, initial encounter [facility])     ED Discharge Orders    None      Portions of this note were generated with dragon dictation software. Dictation errors may occur despite best attempts at proofreading.    Sharman Cheek, MD 11/24/17 [id number]

## 2017-11-24 NOTE — ED Notes (Signed)
Pt to CT via w/c accomp by CT tech; c-collar in place 

## 2017-11-24 NOTE — ED Triage Notes (Addendum)
Patient fell down back steps of house and lives alone and was able to get back up and call neices who brought her in. Patient is on plavix and denies LOC. Patient has abrasions on left leg and forehead and complains of neck pain. The back porch has 4-5 steps unsure how many steps she fell down.

## 2017-11-24 NOTE — ED Notes (Signed)
Called [facility] for transport to [facility] ED  [phone]

## 2017-11-25 DIAGNOSIS — S299XXA Unspecified injury of thorax, initial encounter: Secondary | ICD-10-CM | POA: Diagnosis not present

## 2017-11-25 DIAGNOSIS — S32018A Other fracture of first lumbar vertebra, initial encounter for closed fracture: Secondary | ICD-10-CM | POA: Diagnosis not present

## 2017-11-25 DIAGNOSIS — S12090A Other displaced fracture of first cervical vertebra, initial encounter for closed fracture: Secondary | ICD-10-CM | POA: Diagnosis not present

## 2017-11-25 DIAGNOSIS — M4312 Spondylolisthesis, cervical region: Secondary | ICD-10-CM | POA: Diagnosis not present

## 2017-11-25 DIAGNOSIS — S3991XA Unspecified injury of abdomen, initial encounter: Secondary | ICD-10-CM | POA: Diagnosis not present

## 2017-11-25 DIAGNOSIS — S022XXA Fracture of nasal bones, initial encounter for closed fracture: Secondary | ICD-10-CM | POA: Diagnosis not present

## 2017-11-25 DIAGNOSIS — S12190A Other displaced fracture of second cervical vertebra, initial encounter for closed fracture: Secondary | ICD-10-CM | POA: Diagnosis not present

## 2017-11-25 DIAGNOSIS — S199XXA Unspecified injury of neck, initial encounter: Secondary | ICD-10-CM | POA: Diagnosis not present

## 2017-11-25 DIAGNOSIS — M85862 Other specified disorders of bone density and structure, left lower leg: Secondary | ICD-10-CM | POA: Diagnosis not present

## 2017-11-25 DIAGNOSIS — J9811 Atelectasis: Secondary | ICD-10-CM | POA: Diagnosis not present

## 2017-11-25 DIAGNOSIS — S12000A Unspecified displaced fracture of first cervical vertebra, initial encounter for closed fracture: Secondary | ICD-10-CM | POA: Diagnosis not present

## 2017-11-25 DIAGNOSIS — M7989 Other specified soft tissue disorders: Secondary | ICD-10-CM | POA: Diagnosis not present

## 2017-11-25 DIAGNOSIS — S3993XA Unspecified injury of pelvis, initial encounter: Secondary | ICD-10-CM | POA: Diagnosis not present

## 2017-11-25 DIAGNOSIS — S32019A Unspecified fracture of first lumbar vertebra, initial encounter for closed fracture: Secondary | ICD-10-CM | POA: Diagnosis not present

## 2017-11-25 DIAGNOSIS — E041 Nontoxic single thyroid nodule: Secondary | ICD-10-CM | POA: Diagnosis not present

## 2017-11-25 DIAGNOSIS — S12490A Other displaced fracture of fifth cervical vertebra, initial encounter for closed fracture: Secondary | ICD-10-CM | POA: Diagnosis not present

## 2017-11-25 DIAGNOSIS — S12401A Unspecified nondisplaced fracture of fifth cervical vertebra, initial encounter for closed fracture: Secondary | ICD-10-CM | POA: Diagnosis not present

## 2017-11-26 DIAGNOSIS — I10 Essential (primary) hypertension: Secondary | ICD-10-CM | POA: Diagnosis not present

## 2017-11-26 DIAGNOSIS — W108XXA Fall (on) (from) other stairs and steps, initial encounter: Secondary | ICD-10-CM | POA: Diagnosis not present

## 2017-11-26 DIAGNOSIS — S12300A Unspecified displaced fracture of fourth cervical vertebra, initial encounter for closed fracture: Secondary | ICD-10-CM | POA: Diagnosis not present

## 2017-11-26 DIAGNOSIS — S32018A Other fracture of first lumbar vertebra, initial encounter for closed fracture: Secondary | ICD-10-CM | POA: Diagnosis not present

## 2017-11-26 DIAGNOSIS — R4182 Altered mental status, unspecified: Secondary | ICD-10-CM | POA: Diagnosis not present

## 2017-11-26 DIAGNOSIS — S12090A Other displaced fracture of first cervical vertebra, initial encounter for closed fracture: Secondary | ICD-10-CM | POA: Diagnosis not present

## 2017-11-26 DIAGNOSIS — S12030A Displaced posterior arch fracture of first cervical vertebra, initial encounter for closed fracture: Secondary | ICD-10-CM | POA: Diagnosis not present

## 2017-11-26 DIAGNOSIS — Z01818 Encounter for other preprocedural examination: Secondary | ICD-10-CM | POA: Diagnosis not present

## 2017-11-26 DIAGNOSIS — S022XXA Fracture of nasal bones, initial encounter for closed fracture: Secondary | ICD-10-CM | POA: Diagnosis not present

## 2017-11-26 DIAGNOSIS — S12111A Posterior displaced Type II dens fracture, initial encounter for closed fracture: Secondary | ICD-10-CM | POA: Diagnosis not present

## 2017-11-26 DIAGNOSIS — S12000A Unspecified displaced fracture of first cervical vertebra, initial encounter for closed fracture: Secondary | ICD-10-CM | POA: Diagnosis not present

## 2017-11-26 DIAGNOSIS — S129XXA Fracture of neck, unspecified, initial encounter: Secondary | ICD-10-CM | POA: Diagnosis not present

## 2017-11-26 DIAGNOSIS — S12401A Unspecified nondisplaced fracture of fifth cervical vertebra, initial encounter for closed fracture: Secondary | ICD-10-CM | POA: Diagnosis not present

## 2017-11-26 DIAGNOSIS — S12100A Unspecified displaced fracture of second cervical vertebra, initial encounter for closed fracture: Secondary | ICD-10-CM | POA: Diagnosis not present

## 2017-11-27 DIAGNOSIS — N390 Urinary tract infection, site not specified: Secondary | ICD-10-CM | POA: Diagnosis not present

## 2017-11-27 DIAGNOSIS — S129XXA Fracture of neck, unspecified, initial encounter: Secondary | ICD-10-CM | POA: Diagnosis not present

## 2017-11-27 DIAGNOSIS — S12090A Other displaced fracture of first cervical vertebra, initial encounter for closed fracture: Secondary | ICD-10-CM | POA: Diagnosis not present

## 2017-11-27 DIAGNOSIS — I10 Essential (primary) hypertension: Secondary | ICD-10-CM | POA: Diagnosis not present

## 2017-11-27 DIAGNOSIS — R000 Tachycardia, unspecified: Secondary | ICD-10-CM | POA: Diagnosis not present

## 2017-11-27 DIAGNOSIS — I4949 Other premature depolarization: Secondary | ICD-10-CM | POA: Diagnosis not present

## 2017-11-28 DIAGNOSIS — N390 Urinary tract infection, site not specified: Secondary | ICD-10-CM | POA: Diagnosis not present

## 2017-11-28 DIAGNOSIS — S12090A Other displaced fracture of first cervical vertebra, initial encounter for closed fracture: Secondary | ICD-10-CM | POA: Diagnosis not present

## 2017-11-28 DIAGNOSIS — I10 Essential (primary) hypertension: Secondary | ICD-10-CM | POA: Diagnosis not present

## 2017-11-29 DIAGNOSIS — S12090A Other displaced fracture of first cervical vertebra, initial encounter for closed fracture: Secondary | ICD-10-CM | POA: Diagnosis not present

## 2017-11-29 DIAGNOSIS — N390 Urinary tract infection, site not specified: Secondary | ICD-10-CM | POA: Diagnosis not present

## 2017-11-29 DIAGNOSIS — I10 Essential (primary) hypertension: Secondary | ICD-10-CM | POA: Diagnosis not present

## 2017-11-29 DIAGNOSIS — S12491A Other nondisplaced fracture of fifth cervical vertebra, initial encounter for closed fracture: Secondary | ICD-10-CM | POA: Diagnosis not present

## 2017-11-30 DIAGNOSIS — S12190A Other displaced fracture of second cervical vertebra, initial encounter for closed fracture: Secondary | ICD-10-CM | POA: Diagnosis not present

## 2017-11-30 DIAGNOSIS — B9620 Unspecified Escherichia coli [E. coli] as the cause of diseases classified elsewhere: Secondary | ICD-10-CM | POA: Diagnosis not present

## 2017-11-30 DIAGNOSIS — S12090A Other displaced fracture of first cervical vertebra, initial encounter for closed fracture: Secondary | ICD-10-CM | POA: Diagnosis not present

## 2017-11-30 DIAGNOSIS — N390 Urinary tract infection, site not specified: Secondary | ICD-10-CM | POA: Diagnosis not present

## 2017-12-01 DIAGNOSIS — S12491A Other nondisplaced fracture of fifth cervical vertebra, initial encounter for closed fracture: Secondary | ICD-10-CM | POA: Diagnosis not present

## 2017-12-01 DIAGNOSIS — S12111D Posterior displaced Type II dens fracture, subsequent encounter for fracture with routine healing: Secondary | ICD-10-CM | POA: Diagnosis not present

## 2017-12-01 DIAGNOSIS — S12090D Other displaced fracture of first cervical vertebra, subsequent encounter for fracture with routine healing: Secondary | ICD-10-CM | POA: Diagnosis not present

## 2017-12-01 DIAGNOSIS — E785 Hyperlipidemia, unspecified: Secondary | ICD-10-CM | POA: Diagnosis not present

## 2017-12-01 DIAGNOSIS — R5381 Other malaise: Secondary | ICD-10-CM | POA: Diagnosis not present

## 2017-12-01 DIAGNOSIS — S12491D Other nondisplaced fracture of fifth cervical vertebra, subsequent encounter for fracture with routine healing: Secondary | ICD-10-CM | POA: Diagnosis not present

## 2017-12-01 DIAGNOSIS — S12100D Unspecified displaced fracture of second cervical vertebra, subsequent encounter for fracture with routine healing: Secondary | ICD-10-CM | POA: Diagnosis not present

## 2017-12-01 DIAGNOSIS — K5730 Diverticulosis of large intestine without perforation or abscess without bleeding: Secondary | ICD-10-CM | POA: Diagnosis not present

## 2017-12-01 DIAGNOSIS — I10 Essential (primary) hypertension: Secondary | ICD-10-CM | POA: Diagnosis not present

## 2017-12-01 DIAGNOSIS — S12101D Unspecified nondisplaced fracture of second cervical vertebra, subsequent encounter for fracture with routine healing: Secondary | ICD-10-CM | POA: Diagnosis not present

## 2017-12-01 DIAGNOSIS — S32454D Nondisplaced transverse fracture of right acetabulum, subsequent encounter for fracture with routine healing: Secondary | ICD-10-CM | POA: Diagnosis not present

## 2017-12-01 DIAGNOSIS — W19XXXD Unspecified fall, subsequent encounter: Secondary | ICD-10-CM | POA: Diagnosis not present

## 2017-12-01 DIAGNOSIS — G9340 Encephalopathy, unspecified: Secondary | ICD-10-CM | POA: Diagnosis not present

## 2017-12-01 DIAGNOSIS — M6281 Muscle weakness (generalized): Secondary | ICD-10-CM | POA: Diagnosis not present

## 2017-12-01 DIAGNOSIS — R279 Unspecified lack of coordination: Secondary | ICD-10-CM | POA: Diagnosis not present

## 2017-12-01 DIAGNOSIS — F99 Mental disorder, not otherwise specified: Secondary | ICD-10-CM | POA: Diagnosis not present

## 2017-12-01 DIAGNOSIS — S12090A Other displaced fracture of first cervical vertebra, initial encounter for closed fracture: Secondary | ICD-10-CM | POA: Diagnosis not present

## 2017-12-01 DIAGNOSIS — S12030D Displaced posterior arch fracture of first cervical vertebra, subsequent encounter for fracture with routine healing: Secondary | ICD-10-CM | POA: Diagnosis not present

## 2017-12-01 DIAGNOSIS — S022XXD Fracture of nasal bones, subsequent encounter for fracture with routine healing: Secondary | ICD-10-CM | POA: Diagnosis not present

## 2017-12-01 DIAGNOSIS — S12190A Other displaced fracture of second cervical vertebra, initial encounter for closed fracture: Secondary | ICD-10-CM | POA: Diagnosis not present

## 2017-12-01 DIAGNOSIS — R41841 Cognitive communication deficit: Secondary | ICD-10-CM | POA: Diagnosis not present

## 2017-12-01 DIAGNOSIS — E559 Vitamin D deficiency, unspecified: Secondary | ICD-10-CM | POA: Diagnosis not present

## 2017-12-01 DIAGNOSIS — G4700 Insomnia, unspecified: Secondary | ICD-10-CM | POA: Diagnosis not present

## 2017-12-01 DIAGNOSIS — Z8673 Personal history of transient ischemic attack (TIA), and cerebral infarction without residual deficits: Secondary | ICD-10-CM | POA: Diagnosis not present

## 2017-12-01 DIAGNOSIS — S32018A Other fracture of first lumbar vertebra, initial encounter for closed fracture: Secondary | ICD-10-CM | POA: Diagnosis not present

## 2017-12-01 MED ORDER — ALBUTEROL SULFATE (2.5 MG/3ML) 0.083% IN NEBU
2.50 | INHALATION_SOLUTION | RESPIRATORY_TRACT | Status: DC
Start: ? — End: 2017-12-01

## 2017-12-01 MED ORDER — BACITRACIN 500 UNIT/GM EX OINT
TOPICAL_OINTMENT | CUTANEOUS | Status: DC
Start: 2017-12-02 — End: 2017-12-01

## 2017-12-01 MED ORDER — HYDRALAZINE HCL 20 MG/ML IJ SOLN
10.00 | INTRAMUSCULAR | Status: DC
Start: ? — End: 2017-12-01

## 2017-12-01 MED ORDER — ENOXAPARIN SODIUM 30 MG/0.3ML ~~LOC~~ SOLN
30.00 | SUBCUTANEOUS | Status: DC
Start: 2017-12-01 — End: 2017-12-01

## 2017-12-01 MED ORDER — MELATONIN 3 MG PO TABS
6.00 | ORAL_TABLET | ORAL | Status: DC
Start: 2017-12-01 — End: 2017-12-01

## 2017-12-01 MED ORDER — LOSARTAN POTASSIUM 25 MG PO TABS
50.00 | ORAL_TABLET | ORAL | Status: DC
Start: 2017-12-02 — End: 2017-12-01

## 2017-12-01 MED ORDER — ACETAMINOPHEN 500 MG PO TABS
1000.00 | ORAL_TABLET | ORAL | Status: DC
Start: 2017-12-01 — End: 2017-12-01

## 2017-12-01 MED ORDER — ATORVASTATIN CALCIUM 40 MG PO TABS
40.00 | ORAL_TABLET | ORAL | Status: DC
Start: 2017-12-02 — End: 2017-12-01

## 2017-12-02 DIAGNOSIS — G9340 Encephalopathy, unspecified: Secondary | ICD-10-CM | POA: Diagnosis not present

## 2017-12-02 DIAGNOSIS — S12100D Unspecified displaced fracture of second cervical vertebra, subsequent encounter for fracture with routine healing: Secondary | ICD-10-CM | POA: Diagnosis not present

## 2017-12-02 DIAGNOSIS — I10 Essential (primary) hypertension: Secondary | ICD-10-CM | POA: Diagnosis not present

## 2017-12-02 DIAGNOSIS — Z8673 Personal history of transient ischemic attack (TIA), and cerebral infarction without residual deficits: Secondary | ICD-10-CM | POA: Diagnosis not present

## 2017-12-12 ENCOUNTER — Telehealth: Payer: Self-pay | Admitting: Family Medicine

## 2017-12-12 NOTE — Telephone Encounter (Signed)
Pt's niece dropped off physician statement form to be filled out. Placed in rx tower.

## 2017-12-15 NOTE — Telephone Encounter (Signed)
In your inbox.

## 2017-12-15 NOTE — Telephone Encounter (Signed)
The paperwork is detailed and there is an assessment for cognitive impairment Can't do it w/o a visit Please schedule 30 min visit

## 2017-12-16 NOTE — Telephone Encounter (Signed)
I will see her then. Thanks.

## 2017-12-16 NOTE — Telephone Encounter (Signed)
Niece Tammy advise me the form is to get her permanently placed at a nursing home and have her insurance pay for it after pt fell and broke her neck last month family had to find a place. Appt scheduled for this Thursday.

## 2017-12-18 ENCOUNTER — Ambulatory Visit (INDEPENDENT_AMBULATORY_CARE_PROVIDER_SITE_OTHER): Payer: Medicare Other | Admitting: Family Medicine

## 2017-12-18 ENCOUNTER — Encounter: Payer: Self-pay | Admitting: Family Medicine

## 2017-12-18 VITALS — BP 128/82 | HR 95 | Temp 97.4°F | Ht 63.0 in | Wt 134.5 lb

## 2017-12-18 DIAGNOSIS — F0150 Vascular dementia without behavioral disturbance: Secondary | ICD-10-CM

## 2017-12-18 DIAGNOSIS — I10 Essential (primary) hypertension: Secondary | ICD-10-CM

## 2017-12-18 DIAGNOSIS — E213 Hyperparathyroidism, unspecified: Secondary | ICD-10-CM | POA: Diagnosis not present

## 2017-12-18 DIAGNOSIS — S129XXS Fracture of neck, unspecified, sequela: Secondary | ICD-10-CM

## 2017-12-18 DIAGNOSIS — R634 Abnormal weight loss: Secondary | ICD-10-CM

## 2017-12-18 DIAGNOSIS — S129XXA Fracture of neck, unspecified, initial encounter: Secondary | ICD-10-CM | POA: Insufficient documentation

## 2017-12-18 NOTE — Assessment & Plan Note (Signed)
Ca remained slightly elevated during hospitalization  D level in 30s

## 2017-12-18 NOTE — Assessment & Plan Note (Addendum)
Progressive- affecting ability to perform ADLs but no behavoir changes  At her age-does not desire medication  She does need close care and help with ADLS

## 2017-12-18 NOTE — Assessment & Plan Note (Signed)
bp in fair control at this time  BP Readings from Last 1 Encounters:  12/18/17 128/82   No changes needed Most recent labs reviewed  Disc lifstyle change with low sodium diet and exercise  Doing ok without HCTZ  Substantial wt loss since her hospitalization

## 2017-12-18 NOTE — Progress Notes (Signed)
Subjective:    Patient ID: [name], female    DOB: 1930-03-05, 84 y.o.   MRN: [id number]  HPI Here to complete paperwork for permanent placement in nursing home/insurance wise  Also f/u from ED visit 10/19   Larey Seat last month and had cervical fractures She was neurologically intact  Did not requ airway protection  Was transferred to [facility]  Several cervical spine fx incl odontoid Also L1 fracture  Nasal bone fracture- (found to actually be chronic)  Had stable EKG Hypercalcemia noted  D level checked   Was prev on plavix for cva in 2013- this was d/c  uti was diag -confusion / tx for ecoli uti with fosfomycin on 10/17  D/c to SNF on hospital day 6 in stable condition   Follow up spine clinic -Nov 25th    Here today  Wt Readings from Last 3 Encounters:  12/18/17 134 lb 8 oz (61 kg)  11/24/17 160 lb (72.6 kg)  07/28/17 160 lb (72.6 kg)   23.83 kg/m   bp is stable today  No cp or palpitations or headaches or edema  No side effects to medicines  BP Readings from Last 3 Encounters:  12/18/17 128/82  11/24/17 (!) 182/120  07/28/17 120/80     plavix and hctz and zoloft were d/c in hospital per her records from [facility] home  Says her pain is "no more than usual"  The Zenaida Niece ride here had a lot of bumps - difficult for her   Likes the place she is in [employer])   Called niece [name] re paperwork for fxn assessment (ie ; need for care) Pt needs help with almost all ADLs at this point [facility] with a walker  Cognitively - has moments of confusion and poor short term memory  Unable to handle finances or make plans Hx of several strokes-suspect vascular as well as age related dementia   Forms filled out for ins  In assisted living at this time and would like to remain  Patient Active Problem List   Diagnosis Date Noted  . Cervical spine fracture (HCC) 12/18/2017  . Weight loss 12/18/2017  . Vascular dementia (HCC) 12/18/2017  . Hearing loss 01/09/2016  . Routine  general medical examination at a health care facility 12/31/2015  . Dysuria 09/29/2015  . Anxiety and depression 09/21/2014  . Pedal edema 06/22/2014  . Encounter for Medicare annual wellness exam 03/18/2014  . Retinal vein occlusion 03/18/2014  . B12 deficiency 03/15/2014  . Colon cancer screening 04/19/2013  . At risk for falling 12/02/2011  . Hyperparathyroidism (HCC) 11/20/2011  . Back pain, thoracic 09/30/2011  . Scoliosis 09/30/2011  . Degenerative disc disease, lumbar 09/30/2011  . Renal insufficiency 05/27/2011  . Syncope 12/05/2010  . Cerebral artery occlusion with cerebral infarction (HCC) 02/26/2008  . Hyperlipidemia 05/12/2006  . Essential hypertension 05/12/2006  . DIVERTICULOSIS, COLON 05/12/2006  . Osteopenia 05/12/2006   Past Medical History:  Diagnosis Date  . Chronic pain   . DDD (degenerative disc disease)   . Diverticulosis   . Hyperlipidemia   . Hypertension   . Osteopenia   . Stroke (HCC)   . Syncope    Past Surgical History:  Procedure Laterality Date  . ABDOMINAL HYSTERECTOMY    . carotid doppler     normal  . DOPPLER ECHOCARDIOGRAPHY     normal  . EXPLORATORY LAPAROTOMY     2 times both neg   . LAMINECTOMY  06-2003   l2-l4  . SPINE  SURGERY     l3-l4 microdiscectomy   . TONSILLECTOMY     Social History   Tobacco Use  . Smoking status: Never Smoker  . Smokeless tobacco: Never Used  Substance Use Topics  . Alcohol use: No    Alcohol/week: 0.0 standard drinks  . Drug use: No   Family History  Problem Relation Age of Onset  . Hypertension Father   . COPD Father   . Other Mother        Deceased, 19  . Healthy Brother    Allergies  Allergen Reactions  . Celecoxib Other (See Comments)    affects kidneys  . Codeine Nausea Only and Other (See Comments)    dizziness  . Lisinopril Cough  . Etodolac Rash   Current Outpatient Medications on File Prior to Visit  Medication Sig Dispense Refill  . acetaminophen (TYLENOL) 325 MG tablet  Take 650 mg by mouth every 6 (six) hours as needed.    Marland Kitchen atorvastatin (LIPITOR) 40 MG tablet Take 1 tablet (40 mg total) by mouth at bedtime. 90 tablet 3  . Cholecalciferol (VITAMIN D PO) Take 1 tablet by mouth daily.    Marland Kitchen losartan (COZAAR) 100 MG tablet TAKE 1/2 TABLET (50 MG TOTAL) BY MOUTH DAILY. 45 tablet 3  . Melatonin 3 MG CAPS Take 2 capsules by mouth at bedtime.    . Ascorbic Acid (VITAMIN C PO) Take 1 tablet by mouth.    . Brinzolamide-Brimonidine (SIMBRINZA) 1-0.2 % SUSP Place 1 drop into the right eye 3 (three) times daily.     . cyanocobalamin 1000 MCG tablet Take 1,000 mcg by mouth daily.    . fish oil-omega-3 fatty acids 1000 MG capsule Take 1 g by mouth daily.     No current facility-administered medications on file prior to visit.     Review of Systems  Constitutional: Positive for fatigue. Negative for activity change, appetite change, diaphoresis, fever and unexpected weight change.  HENT: Negative for congestion, ear pain, rhinorrhea, sinus pressure and sore throat.   Eyes: Negative for pain, redness and visual disturbance.  Respiratory: Negative for cough, shortness of breath and wheezing.   Cardiovascular: Negative for chest pain and palpitations.  Gastrointestinal: Negative for abdominal pain, blood in stool, constipation and diarrhea.  Endocrine: Negative for polydipsia and polyuria.  Genitourinary: Negative for dysuria, frequency and urgency.  Musculoskeletal: Positive for arthralgias, back pain and neck pain. Negative for myalgias.  Skin: Negative for pallor and rash.  Allergic/Immunologic: Negative for environmental allergies.  Neurological: Negative for dizziness, tremors, seizures, syncope, facial asymmetry, speech difficulty, weakness, light-headedness, numbness and headaches.  Hematological: Negative for adenopathy. Does not bruise/bleed easily.  Psychiatric/Behavioral: Positive for confusion. Negative for behavioral problems, decreased concentration and  dysphoric mood. The patient is not nervous/anxious.        Difficult to interview (alone w/o hearing aides)  Also notable short term memory loss        Objective:   Physical Exam  Constitutional: She appears well-developed and well-nourished. No distress.  Frail appearing elderly female wearing cervical collar  HENT:  Head: Normocephalic and atraumatic.  Mouth/Throat: Oropharynx is clear and moist.  Eyes: Pupils are equal, round, and reactive to light. Conjunctivae and EOM are normal. Right eye exhibits no discharge. Left eye exhibits no discharge. No scleral icterus.  Neck: Normal range of motion. Neck supple.  Cardiovascular: Regular rhythm and normal heart sounds.  Pulmonary/Chest: Effort normal and breath sounds normal. No stridor. No respiratory distress. She has no  wheezes. She has no rales.  Abdominal: Soft. Bowel sounds are normal. She exhibits no distension. There is no tenderness.  Musculoskeletal: She exhibits no edema or deformity.  Lymphadenopathy:    She has no cervical adenopathy.  Neurological: She is alert. She displays normal reflexes. No cranial nerve deficit. Coordination normal.  Skin: Skin is warm and dry. No rash noted. No erythema. No pallor.  Fair complexion    Psychiatric: Her behavior is normal. Her mood appears anxious. Her affect is not blunt. Her speech is tangential. She is not actively hallucinating. Thought content is not paranoid. Cognition and memory are impaired. She expresses no homicidal and no suicidal ideation. She exhibits abnormal recent memory.  Pleasant  Answers questions Some tangential speech  Notable loss of short term memory since last visit  Confuses easily  Not agitated mildy anxoius  She is attentive.          Assessment & Plan:   Problem List Items Addressed This Visit      Cardiovascular and Mediastinum   Essential hypertension    bp in fair control at this time  BP Readings from Last 1 Encounters:  12/18/17 128/82    No changes needed Most recent labs reviewed  Disc lifstyle change with low sodium diet and exercise  Doing ok without HCTZ  Substantial wt loss since her hospitalization         Endocrine   Hyperparathyroidism (HCC) (Chronic)    Ca remained slightly elevated during hospitalization  D level in 30s        Nervous and Auditory   Vascular dementia (HCC)    Progressive- affecting ability to perform ADLs but no behavoir changes  At her age-does not desire medication  She does need close care and help with ADLS        Musculoskeletal and Integument   Cervical spine fracture (HCC) - Primary    S/p hosp at [facility] Reviewed hospital records, lab results and studies in detail  For f/u with neurosurg later this mo No complaints Wearing cervical collar  Still high fall risk-in asst living at this time        Other   Weight loss    From 160 to 134 lb during prolonged hospitalization for cervical fracture Per pt- food is good at [facility] and her appetite is improving  Stressed imp of regular meals with protein

## 2017-12-18 NOTE — Assessment & Plan Note (Signed)
From 160 to 134 lb during prolonged hospitalization for cervical fracture Per pt- food is good at [facility] and her appetite is improving  Stressed imp of regular meals with protein

## 2017-12-18 NOTE — Patient Instructions (Signed)
I'm glad you are doing ok  Make sure to eat your meals and drink fluids  I'm glad you are feeling better and blood pressure is good   I will check in with your niece for more details

## 2017-12-18 NOTE — Assessment & Plan Note (Signed)
S/p hosp at [facility] Reviewed hospital records, lab results and studies in detail  For f/u with neurosurg later this mo No complaints Wearing cervical collar  Still high fall risk-in asst living at this time

## 2017-12-19 DIAGNOSIS — H905 Unspecified sensorineural hearing loss: Secondary | ICD-10-CM | POA: Diagnosis not present

## 2017-12-19 DIAGNOSIS — S12111D Posterior displaced Type II dens fracture, subsequent encounter for fracture with routine healing: Secondary | ICD-10-CM | POA: Diagnosis not present

## 2017-12-19 DIAGNOSIS — Z8673 Personal history of transient ischemic attack (TIA), and cerebral infarction without residual deficits: Secondary | ICD-10-CM | POA: Diagnosis not present

## 2017-12-19 DIAGNOSIS — K5730 Diverticulosis of large intestine without perforation or abscess without bleeding: Secondary | ICD-10-CM | POA: Diagnosis not present

## 2017-12-19 DIAGNOSIS — S12101D Unspecified nondisplaced fracture of second cervical vertebra, subsequent encounter for fracture with routine healing: Secondary | ICD-10-CM | POA: Diagnosis not present

## 2017-12-19 DIAGNOSIS — I10 Essential (primary) hypertension: Secondary | ICD-10-CM | POA: Diagnosis not present

## 2017-12-19 DIAGNOSIS — M5136 Other intervertebral disc degeneration, lumbar region: Secondary | ICD-10-CM | POA: Diagnosis not present

## 2017-12-19 DIAGNOSIS — E785 Hyperlipidemia, unspecified: Secondary | ICD-10-CM | POA: Diagnosis not present

## 2017-12-19 DIAGNOSIS — E559 Vitamin D deficiency, unspecified: Secondary | ICD-10-CM | POA: Diagnosis not present

## 2017-12-19 DIAGNOSIS — S12030D Displaced posterior arch fracture of first cervical vertebra, subsequent encounter for fracture with routine healing: Secondary | ICD-10-CM | POA: Diagnosis not present

## 2017-12-19 DIAGNOSIS — G4700 Insomnia, unspecified: Secondary | ICD-10-CM | POA: Diagnosis not present

## 2017-12-19 DIAGNOSIS — S12491D Other nondisplaced fracture of fifth cervical vertebra, subsequent encounter for fracture with routine healing: Secondary | ICD-10-CM | POA: Diagnosis not present

## 2017-12-19 DIAGNOSIS — S32454D Nondisplaced transverse fracture of right acetabulum, subsequent encounter for fracture with routine healing: Secondary | ICD-10-CM | POA: Diagnosis not present

## 2017-12-22 ENCOUNTER — Telehealth: Payer: Self-pay

## 2017-12-22 NOTE — Telephone Encounter (Signed)
Please ok those verbal orders  

## 2017-12-22 NOTE — Telephone Encounter (Signed)
Left message for [name] giving verbal orders for [facility] PT 1 x a week for 1 week and 2 x a week for 4 weeks per Dr. Milinda Antis.

## 2017-12-22 NOTE — Telephone Encounter (Signed)
[name] PT with [facility] left v/m requesting verbal orders for [facility] PT 1 x a week for 1 week and 2 x a week for 4 weeks. Pt is in assisted living.Please advise.

## 2017-12-29 DIAGNOSIS — S12101D Unspecified nondisplaced fracture of second cervical vertebra, subsequent encounter for fracture with routine healing: Secondary | ICD-10-CM | POA: Diagnosis not present

## 2017-12-29 DIAGNOSIS — S12111D Posterior displaced Type II dens fracture, subsequent encounter for fracture with routine healing: Secondary | ICD-10-CM | POA: Diagnosis not present

## 2017-12-29 DIAGNOSIS — S32454D Nondisplaced transverse fracture of right acetabulum, subsequent encounter for fracture with routine healing: Secondary | ICD-10-CM

## 2017-12-29 DIAGNOSIS — S12030D Displaced posterior arch fracture of first cervical vertebra, subsequent encounter for fracture with routine healing: Secondary | ICD-10-CM | POA: Diagnosis not present

## 2017-12-29 DIAGNOSIS — S12491D Other nondisplaced fracture of fifth cervical vertebra, subsequent encounter for fracture with routine healing: Secondary | ICD-10-CM | POA: Diagnosis not present

## 2018-01-05 DIAGNOSIS — H547 Unspecified visual loss: Secondary | ICD-10-CM | POA: Diagnosis not present

## 2018-01-05 DIAGNOSIS — S12091D Other nondisplaced fracture of first cervical vertebra, subsequent encounter for fracture with routine healing: Secondary | ICD-10-CM | POA: Diagnosis not present

## 2018-01-05 DIAGNOSIS — S12100D Unspecified displaced fracture of second cervical vertebra, subsequent encounter for fracture with routine healing: Secondary | ICD-10-CM | POA: Diagnosis not present

## 2018-01-05 DIAGNOSIS — E785 Hyperlipidemia, unspecified: Secondary | ICD-10-CM | POA: Diagnosis not present

## 2018-01-05 DIAGNOSIS — I10 Essential (primary) hypertension: Secondary | ICD-10-CM | POA: Diagnosis not present

## 2018-01-05 DIAGNOSIS — Z79899 Other long term (current) drug therapy: Secondary | ICD-10-CM | POA: Diagnosis not present

## 2018-01-05 DIAGNOSIS — Z8673 Personal history of transient ischemic attack (TIA), and cerebral infarction without residual deficits: Secondary | ICD-10-CM | POA: Diagnosis not present

## 2018-01-05 DIAGNOSIS — G629 Polyneuropathy, unspecified: Secondary | ICD-10-CM | POA: Diagnosis not present

## 2018-01-05 DIAGNOSIS — H409 Unspecified glaucoma: Secondary | ICD-10-CM | POA: Diagnosis not present

## 2018-01-05 DIAGNOSIS — M8588 Other specified disorders of bone density and structure, other site: Secondary | ICD-10-CM | POA: Diagnosis not present

## 2018-01-05 DIAGNOSIS — S12101A Unspecified nondisplaced fracture of second cervical vertebra, initial encounter for closed fracture: Secondary | ICD-10-CM | POA: Diagnosis not present

## 2018-01-05 DIAGNOSIS — S12400A Unspecified displaced fracture of fifth cervical vertebra, initial encounter for closed fracture: Secondary | ICD-10-CM | POA: Diagnosis not present

## 2018-01-05 DIAGNOSIS — S12031A Nondisplaced posterior arch fracture of first cervical vertebra, initial encounter for closed fracture: Secondary | ICD-10-CM | POA: Diagnosis not present

## 2018-01-06 ENCOUNTER — Telehealth: Payer: Self-pay | Admitting: Family Medicine

## 2018-01-06 NOTE — Telephone Encounter (Signed)
Verbal order given  

## 2018-01-06 NOTE — Telephone Encounter (Signed)
Need verbal orders for speech therapy.

## 2018-01-06 NOTE — Telephone Encounter (Signed)
Please ok those verbal orders  

## 2018-01-20 DIAGNOSIS — H35362 Drusen (degenerative) of macula, left eye: Secondary | ICD-10-CM | POA: Diagnosis not present

## 2018-01-20 DIAGNOSIS — H34811 Central retinal vein occlusion, right eye, with macular edema: Secondary | ICD-10-CM | POA: Diagnosis not present

## 2018-01-20 DIAGNOSIS — H3582 Retinal ischemia: Secondary | ICD-10-CM | POA: Diagnosis not present

## 2018-01-20 DIAGNOSIS — H3540 Unspecified peripheral retinal degeneration: Secondary | ICD-10-CM | POA: Diagnosis not present

## 2018-01-26 ENCOUNTER — Telehealth: Payer: Self-pay

## 2018-01-26 NOTE — Telephone Encounter (Signed)
[name] speech therapist with [facility] said today pts BP was 185/104 and after rest went to 174/93. On 01/20/18 BP was 150/88 and on 01/22/18 BP was 157/97. Pt has not missed taking Losartan 100 mg taking 1/2 tab daily. Pt is presently at [facility] [place]. Pt has had dizziness on and off. Pt is feeling depressed because pt may not be able to go back to her home and may have to stay at assisted living; No SI/HI. Bonita QuinLinda wants to know what needs to be done today if anything and request cb today. If any change in orders please fax written orders to [facility] fax # [phone]. [facility] cannot take verbal orders.

## 2018-01-26 NOTE — Telephone Encounter (Signed)
[name] notified of Dr. Royden Purlower's comments and verbalized understanding. Order faxed to fax # provided giving them the updated does of med

## 2018-01-26 NOTE — Telephone Encounter (Signed)
Please have her increase the losartan to 100 mg a whole pill (currently takes 1/2) daily  If any side effects or problems please let me know  Keep me posted re: bp  Follow up in the next 2-3 mo unless already scheduled

## 2018-01-27 ENCOUNTER — Encounter: Payer: Self-pay | Admitting: Family Medicine

## 2018-01-27 MED ORDER — SERTRALINE HCL 50 MG PO TABS: 50.0000 mg | ORAL_TABLET | Freq: Every day | ORAL | 3 refills | Status: DC

## 2018-01-27 NOTE — Telephone Encounter (Signed)
Fax didn't go through yesterday so I called [facility] assisted [facility] and she advise me that the fax # is [phone] (not [phone]), so I resent it and it did go through also the rep that answered the phone will advise the med tech of the change in med

## 2018-01-29 DIAGNOSIS — S12101D Unspecified nondisplaced fracture of second cervical vertebra, subsequent encounter for fracture with routine healing: Secondary | ICD-10-CM | POA: Diagnosis not present

## 2018-01-29 DIAGNOSIS — S12000A Unspecified displaced fracture of first cervical vertebra, initial encounter for closed fracture: Secondary | ICD-10-CM | POA: Diagnosis not present

## 2018-01-29 DIAGNOSIS — S12000D Unspecified displaced fracture of first cervical vertebra, subsequent encounter for fracture with routine healing: Secondary | ICD-10-CM | POA: Diagnosis not present

## 2018-01-29 DIAGNOSIS — S12100A Unspecified displaced fracture of second cervical vertebra, initial encounter for closed fracture: Secondary | ICD-10-CM | POA: Diagnosis not present

## 2018-01-29 DIAGNOSIS — M5030 Other cervical disc degeneration, unspecified cervical region: Secondary | ICD-10-CM | POA: Diagnosis not present

## 2018-02-19 DIAGNOSIS — M5136 Other intervertebral disc degeneration, lumbar region: Secondary | ICD-10-CM | POA: Diagnosis not present

## 2018-02-19 DIAGNOSIS — S32454D Nondisplaced transverse fracture of right acetabulum, subsequent encounter for fracture with routine healing: Secondary | ICD-10-CM | POA: Diagnosis not present

## 2018-02-19 DIAGNOSIS — K5730 Diverticulosis of large intestine without perforation or abscess without bleeding: Secondary | ICD-10-CM | POA: Diagnosis not present

## 2018-02-19 DIAGNOSIS — I10 Essential (primary) hypertension: Secondary | ICD-10-CM | POA: Diagnosis not present

## 2018-02-19 DIAGNOSIS — E559 Vitamin D deficiency, unspecified: Secondary | ICD-10-CM | POA: Diagnosis not present

## 2018-02-19 DIAGNOSIS — R41841 Cognitive communication deficit: Secondary | ICD-10-CM | POA: Diagnosis not present

## 2018-02-19 DIAGNOSIS — G4700 Insomnia, unspecified: Secondary | ICD-10-CM | POA: Diagnosis not present

## 2018-02-19 DIAGNOSIS — S022XXD Fracture of nasal bones, subsequent encounter for fracture with routine healing: Secondary | ICD-10-CM | POA: Diagnosis not present

## 2018-02-19 DIAGNOSIS — S12101D Unspecified nondisplaced fracture of second cervical vertebra, subsequent encounter for fracture with routine healing: Secondary | ICD-10-CM | POA: Diagnosis not present

## 2018-02-19 DIAGNOSIS — S12030D Displaced posterior arch fracture of first cervical vertebra, subsequent encounter for fracture with routine healing: Secondary | ICD-10-CM | POA: Diagnosis not present

## 2018-02-19 DIAGNOSIS — S12111D Posterior displaced Type II dens fracture, subsequent encounter for fracture with routine healing: Secondary | ICD-10-CM | POA: Diagnosis not present

## 2018-02-19 DIAGNOSIS — H905 Unspecified sensorineural hearing loss: Secondary | ICD-10-CM | POA: Diagnosis not present

## 2018-02-19 DIAGNOSIS — S12491D Other nondisplaced fracture of fifth cervical vertebra, subsequent encounter for fracture with routine healing: Secondary | ICD-10-CM | POA: Diagnosis not present

## 2018-03-19 DIAGNOSIS — S12101D Unspecified nondisplaced fracture of second cervical vertebra, subsequent encounter for fracture with routine healing: Secondary | ICD-10-CM | POA: Diagnosis not present

## 2018-03-19 DIAGNOSIS — S12491D Other nondisplaced fracture of fifth cervical vertebra, subsequent encounter for fracture with routine healing: Secondary | ICD-10-CM | POA: Diagnosis not present

## 2018-03-19 DIAGNOSIS — S12030D Displaced posterior arch fracture of first cervical vertebra, subsequent encounter for fracture with routine healing: Secondary | ICD-10-CM | POA: Diagnosis not present

## 2018-03-19 DIAGNOSIS — S12111D Posterior displaced Type II dens fracture, subsequent encounter for fracture with routine healing: Secondary | ICD-10-CM | POA: Diagnosis not present

## 2018-04-05 ENCOUNTER — Emergency Department (HOSPITAL_COMMUNITY): Payer: Medicare Other

## 2018-04-05 ENCOUNTER — Emergency Department (HOSPITAL_COMMUNITY)
Admission: EM | Admit: 2018-04-05 | Discharge: 2018-04-05 | Disposition: A | Discharge status: Home / Self Care | Payer: Medicare Other | Attending: Emergency Medicine | Admitting: Emergency Medicine

## 2018-04-05 ENCOUNTER — Encounter (HOSPITAL_COMMUNITY): Payer: Self-pay | Admitting: Emergency Medicine

## 2018-04-05 ENCOUNTER — Other Ambulatory Visit: Payer: Self-pay

## 2018-04-05 DIAGNOSIS — F0390 Unspecified dementia without behavioral disturbance: Secondary | ICD-10-CM | POA: Insufficient documentation

## 2018-04-05 DIAGNOSIS — Y939 Activity, unspecified: Secondary | ICD-10-CM | POA: Insufficient documentation

## 2018-04-05 DIAGNOSIS — W19XXXA Unspecified fall, initial encounter: Secondary | ICD-10-CM | POA: Insufficient documentation

## 2018-04-05 DIAGNOSIS — R000 Tachycardia, unspecified: Secondary | ICD-10-CM | POA: Diagnosis not present

## 2018-04-05 DIAGNOSIS — Z79899 Other long term (current) drug therapy: Secondary | ICD-10-CM | POA: Diagnosis not present

## 2018-04-05 DIAGNOSIS — Z8673 Personal history of transient ischemic attack (TIA), and cerebral infarction without residual deficits: Secondary | ICD-10-CM | POA: Diagnosis not present

## 2018-04-05 DIAGNOSIS — Y92129 Unspecified place in nursing home as the place of occurrence of the external cause: Secondary | ICD-10-CM | POA: Insufficient documentation

## 2018-04-05 DIAGNOSIS — M545 Low back pain: Secondary | ICD-10-CM | POA: Diagnosis not present

## 2018-04-05 DIAGNOSIS — R52 Pain, unspecified: Secondary | ICD-10-CM | POA: Diagnosis not present

## 2018-04-05 DIAGNOSIS — Y998 Other external cause status: Secondary | ICD-10-CM | POA: Insufficient documentation

## 2018-04-05 DIAGNOSIS — I10 Essential (primary) hypertension: Secondary | ICD-10-CM | POA: Diagnosis not present

## 2018-04-05 DIAGNOSIS — S3992XA Unspecified injury of lower back, initial encounter: Secondary | ICD-10-CM | POA: Diagnosis not present

## 2018-04-05 DIAGNOSIS — S22080A Wedge compression fracture of T11-T12 vertebra, initial encounter for closed fracture: Secondary | ICD-10-CM | POA: Insufficient documentation

## 2018-04-05 DIAGNOSIS — S79912A Unspecified injury of left hip, initial encounter: Secondary | ICD-10-CM | POA: Diagnosis not present

## 2018-04-05 DIAGNOSIS — S199XXA Unspecified injury of neck, initial encounter: Secondary | ICD-10-CM | POA: Diagnosis not present

## 2018-04-05 DIAGNOSIS — S0990XA Unspecified injury of head, initial encounter: Secondary | ICD-10-CM | POA: Diagnosis not present

## 2018-04-05 DIAGNOSIS — M5489 Other dorsalgia: Secondary | ICD-10-CM | POA: Diagnosis not present

## 2018-04-05 DIAGNOSIS — S79911A Unspecified injury of right hip, initial encounter: Secondary | ICD-10-CM | POA: Diagnosis not present

## 2018-04-05 LAB — CBC WITH DIFFERENTIAL/PLATELET
Abs Immature Granulocytes: 0.06 10*3/uL (ref 0.00–0.07)
Basophils Absolute: 0 10*3/uL (ref 0.0–0.1)
Basophils Relative: 0 %
EOS ABS: 0 10*3/uL (ref 0.0–0.5)
EOS PCT: 0 %
HEMATOCRIT: 37 % (ref 36.0–46.0)
Hemoglobin: 12 g/dL (ref 12.0–15.0)
Immature Granulocytes: 1 %
LYMPHS ABS: 1.2 10*3/uL (ref 0.7–4.0)
Lymphocytes Relative: 12 %
MCH: 33.1 pg (ref 26.0–34.0)
MCHC: 32.4 g/dL (ref 30.0–36.0)
MCV: 102.2 fL — AB (ref 80.0–100.0)
MONOS PCT: 11 %
Monocytes Absolute: 1.1 10*3/uL — ABNORMAL HIGH (ref 0.1–1.0)
Neutro Abs: 7.5 10*3/uL (ref 1.7–7.7)
Neutrophils Relative %: 76 %
Platelets: 148 10*3/uL — ABNORMAL LOW (ref 150–400)
RBC: 3.62 MIL/uL — ABNORMAL LOW (ref 3.87–5.11)
RDW: 12.6 % (ref 11.5–15.5)
WBC: 9.9 10*3/uL (ref 4.0–10.5)
nRBC: 0 % (ref 0.0–0.2)

## 2018-04-05 LAB — URINALYSIS, ROUTINE W REFLEX MICROSCOPIC
Bilirubin Urine: NEGATIVE
GLUCOSE, UA: NEGATIVE mg/dL
KETONES UR: 5 mg/dL — AB
Nitrite: NEGATIVE
PROTEIN: NEGATIVE mg/dL
Specific Gravity, Urine: 1.021 (ref 1.005–1.030)
pH: 5 (ref 5.0–8.0)

## 2018-04-05 LAB — BASIC METABOLIC PANEL
Anion gap: 9 (ref 5–15)
BUN: 12 mg/dL (ref 8–23)
CALCIUM: 10.7 mg/dL — AB (ref 8.9–10.3)
CO2: 25 mmol/L (ref 22–32)
CREATININE: 0.79 mg/dL (ref 0.44–1.00)
Chloride: 103 mmol/L (ref 98–111)
GFR calc Af Amer: >60 mL/min (ref >60)
GFR calc non Af Amer: >60 mL/min (ref >60)
Glucose, Bld: 119 mg/dL — ABNORMAL HIGH (ref 70–99)
Potassium: 3.9 mmol/L (ref 3.5–5.1)
Sodium: 137 mmol/L (ref 135–145)

## 2018-04-05 MED ORDER — ONDANSETRON 4 MG PO TBDP: 4.0000 mg | ORAL_TABLET | Freq: Four times a day (QID) | ORAL | 0 refills | Status: DC | PRN

## 2018-04-05 MED ORDER — HYDROCODONE-ACETAMINOPHEN 5-325 MG PO TABS: 1.0000 | ORAL_TABLET | Freq: Four times a day (QID) | ORAL | 0 refills | Status: DC | PRN

## 2018-04-05 MED ORDER — ACETAMINOPHEN 500 MG PO TABS
1000.0000 mg | ORAL_TABLET | Freq: Once | ORAL | Status: AC
  Administered 2018-04-05: 1000 mg via ORAL
  Filled 2018-04-05: qty 2

## 2018-04-05 NOTE — ED Provider Notes (Signed)
TIME SEEN: 3:46 AM  CHIEF COMPLAINT: Fall  HPI: Patient is an 85 year old female with history of hypertension, hyperlipidemia, stroke, possible dementia who presents to the emergency department from her nursing home after a fall.  Fall was unwitnessed.  Patient is unable to tell me why she fell.  States she is supposed to use a walker but does not use it regularly.  Complaining of "tailbone pain".  Has a cervical collar in place from recent cervical fractures.  Denies chest pain, shortness of breath.  No recent fevers, cough, vomiting or diarrhea.  No numbness or focal weakness.  ROS: See HPI Constitutional: no fever  Eyes: no drainage  ENT: no runny nose   Cardiovascular:  no chest pain  Resp: no SOB  GI: no vomiting GU: no dysuria Integumentary: no rash  Allergy: no hives  Musculoskeletal: no leg swelling  Neurological: no slurred speech ROS otherwise negative  PAST MEDICAL HISTORY/PAST SURGICAL HISTORY:  Past Medical History:  Diagnosis Date  . Chronic pain   . DDD (degenerative disc disease)   . Diverticulosis   . Hyperlipidemia   . Hypertension   . Osteopenia   . Stroke (HCC)   . Syncope     MEDICATIONS:  Prior to Admission medications   Medication Sig Start Date End Date Taking? Authorizing Provider  acetaminophen (TYLENOL) 325 MG tablet Take 650 mg by mouth every 6 (six) hours as needed.    [provider]  Ascorbic Acid (VITAMIN C PO) Take 1 tablet by mouth.    [provider]  atorvastatin (LIPITOR) 40 MG tablet Take 1 tablet (40 mg total) by mouth at bedtime. 02/25/17 02/25/18  Tower, Audrie Gallus, MD  Brinzolamide-Brimonidine [facility]) 1-0.2 % SUSP Place 1 drop into the right eye 3 (three) times daily.     [provider]  Cholecalciferol (VITAMIN D PO) Take 1 tablet by mouth daily.    [provider]  cyanocobalamin 1000 MCG tablet Take 1,000 mcg by mouth daily.    [provider]  fish oil-omega-3 fatty acids 1000 MG  capsule Take 1 g by mouth daily.    [provider]  losartan (COZAAR) 100 MG tablet TAKE 1/2 TABLET (50 MG TOTAL) BY MOUTH DAILY. 02/25/17   Tower, Audrie Gallus, MD  Melatonin 3 MG CAPS Take 2 capsules by mouth at bedtime.    [provider]  sertraline (ZOLOFT) 50 MG tablet Take 1 tablet (50 mg total) by mouth daily. 01/27/18   Tower, Audrie Gallus, MD    ALLERGIES:  Allergies  Allergen Reactions  . Celecoxib Other (See Comments)    affects kidneys  . Codeine Nausea Only and Other (See Comments)    dizziness  . Lisinopril Cough  . Etodolac Rash    SOCIAL HISTORY:  Social History   Tobacco Use  . Smoking status: Never Smoker  . Smokeless tobacco: Never Used  Substance Use Topics  . Alcohol use: No    Alcohol/week: 0.0 standard drinks    FAMILY HISTORY: Family History  Problem Relation Age of Onset  . Hypertension Father   . COPD Father   . Other Mother        Deceased, 76  . Healthy Brother     EXAM: BP (!) 155/109 (BP Location: Left Arm)   Pulse (!) 136   Temp (!) 97.5 F (36.4 C) (Axillary)   Resp (!) 21   Ht 5\' 7"  (1.702 m)   Wt 55.1 kg   SpO2 95%  BMI 19.03 kg/m  CONSTITUTIONAL: Alert and oriented to person and place but not time and responds appropriately to questions. Well-appearing; well-nourished; GCS 14, elderly HEAD: Normocephalic; atraumatic EYES: Conjunctivae clear, PERRL, EOMI ENT: normal nose; no rhinorrhea; moist mucous membranes; pharynx without lesions noted; no dental injury; no septal hematoma NECK: Supple, no meningismus, no LAD; no midline spinal tenderness, step-off or deformity; trachea midline cervical collar in place CARD: RRR; S1 and S2 appreciated; no murmurs, no clicks, no rubs, no gallops RESP: Normal chest excursion without splinting or tachypnea; breath sounds clear and equal bilaterally; no wheezes, no rhonchi, no rales; no hypoxia or respiratory distress CHEST:  chest wall stable, no crepitus or ecchymosis or deformity,  nontender to palpation; no flail chest ABD/GI: Normal bowel sounds; non-distended; soft, non-tender, no rebound, no guarding; no ecchymosis or other lesions noted PELVIS:  stable, nontender to palpation BACK:  The back appears normal and is under over the lower lumbar spine and coccyx, there is no CVA tenderness; no midline spinal tenderness, step-off or deformity EXT: Normal ROM in all joints; non-tender to palpation; no edema; normal capillary refill; no cyanosis, no bony tenderness or bony deformity of patient's extremities, no joint effusion, compartments are soft, extremities are warm and well-perfused, no ecchymosis SKIN: Normal color for age and race; warm NEURO: Moves all extremities equally, sensation to light touch intact diffusely, cranial nerves II through XII intact, normal speech, no drift PSYCH: The patient's mood and manner are appropriate. Grooming and personal hygiene are appropriate.  MEDICAL DECISION MAKING: Patient here with unwitnessed fall.  Unable to tell me why she fell.  Will obtain CT of her head, cervical spine, x-ray of the lumbar spine, pelvis and coccyx.  Will give Tylenol for pain.  Neurologically intact.  No recent infectious symptoms.  No chest pain or shortness of breath.  EKG shows no ischemic abnormality.  ED PROGRESS: CT of the head and cervical spine show no acute injury.  Previous C1 and C2 fractures are healing.  X-ray show a possible T12 fracture of unknown acuity.  She does not have significant tenderness in this area.  Pelvis and sacrum and coccyx appear normal.  She is able to ambulate here but has an unsteady gait and requires assistance.  Her niece reports that this is something normal for her.  She states that the patient has had several falls since being in the nursing home.  They currently do not have a neurosurgeon for follow-up and she is not sure how long the patient needs to wear her cervical collar.  She reports that they were seen at [facility]  previously for her cervical spine fractures.  Will obtain urinalysis from patient.  Anticipate discharge home back to nursing facility with pain medication.  Niece comfortable with this plan.  Urine shows moderate leukocyte esterase, 11-20 white blood cells and only few bacteria.  I do not feel this needs to be treated at this time but will send urine culture.  Family comfortable taking patient back to nursing home.  Will discharge with pain and nausea medication to take as needed.  Discussed return precautions.   At this time, I do not feel there is any life-threatening condition present. I have reviewed and discussed all results (EKG, imaging, lab, urine as appropriate) and exam findings with patient/family. I have reviewed nursing notes and appropriate previous records.  I feel the patient is safe to be discharged home without further emergent workup and can continue workup as an outpatient as  needed. Discussed usual and customary return precautions. Patient/family verbalize understanding and are comfortable with this plan.  Outpatient follow-up has been provided as needed. All questions have been answered.    EKG Interpretation  Date/Time:  Sunday April 05 2018 03:29:00 EST Ventricular Rate:  95 PR Interval:    QRS Duration: 99 QT Interval:  349 QTC Calculation: 439 R Axis:   101 Text Interpretation:  Sinus tachycardia Atrial premature complexes Right axis deviation Low voltage, precordial leads Nonspecific T abnormalities, inferior leads No significant change since last tracing other than rate is faster Confirmed by Shandra Szymborski, Baxter Hire [phone]) on 04/05/2018 3:47:13 AM         Kateline Kinkade, Layla Maw, DO 04/05/18 [id number]

## 2018-04-05 NOTE — ED Notes (Signed)
Nurse will draw labs. 

## 2018-04-05 NOTE — ED Triage Notes (Signed)
Pt in from [facility] SNF via [facility] after mechanical fall while using her walker. Fell on R side, hit head, c-collar on. Not on thinners, is c/o R low back pain.

## 2018-04-05 NOTE — ED Notes (Signed)
EKG done

## 2018-04-06 ENCOUNTER — Telehealth: Payer: Self-pay | Admitting: *Deleted

## 2018-04-06 ENCOUNTER — Telehealth: Payer: Self-pay | Admitting: Family Medicine

## 2018-04-06 NOTE — Telephone Encounter (Signed)
Order faxed back to [facility]

## 2018-04-06 NOTE — Telephone Encounter (Signed)
Spoke with Hospital doctor at nursing home. After the call earlier today the pharmacy still did give pt her Rx for the zofran. Amber said she uses it rarely so they are okay for now regarding med issues earlier.  Amber can't take a verbal for the bed alarm she would need an order with Dr. Royden Purl signature on it faxed to them at fax # [phone]

## 2018-04-06 NOTE — Telephone Encounter (Signed)
Please ok the bed alarm  Is she nauseated ?   (if not will not need zofran)- let me know

## 2018-04-06 NOTE — Telephone Encounter (Signed)
[name] called from [facility] stating that patient has returned there from the hospital after a fall [name] stated that Zofran is not covered by patient's insurance. [name] requested an order for a bed alarm for patient's bed because of frequent falls.

## 2018-04-06 NOTE — Telephone Encounter (Signed)
Order in IN box 

## 2018-04-06 NOTE — Telephone Encounter (Signed)
I called niece Satira Mccallum to r/s 2:30 pm 2/25 appt due to a scheduling issue. Please r/s to another 30 OV open time slot.

## 2018-04-07 ENCOUNTER — Ambulatory Visit: Payer: Medicare Other | Admitting: Family Medicine

## 2018-04-07 LAB — URINE CULTURE

## 2018-04-08 ENCOUNTER — Telehealth: Payer: Self-pay | Admitting: Emergency Medicine

## 2018-04-08 NOTE — Progress Notes (Signed)
ED Antimicrobial Stewardship Positive Culture Follow Up   [name] is an 85 y.o. female who [facility] with a chief complaint of  Chief Complaint  Patient presents with  . Fall    Recent Results (from the past 720 hour(s))  Urine culture     Status: Abnormal   Collection Time: 04/05/18  8:15 AM  Result Value Ref Range Status   Specimen Description URINE, RANDOM  Final   Special Requests   Final    NONE Performed at [facility], [address], [place], [place] [address]    Culture 70,000 COLONIES/mL PSEUDOMONAS AERUGINOSA (A)  Final   Report Status 04/07/2018 FINAL  Final   Organism ID, Bacteria PSEUDOMONAS AERUGINOSA (A)  Final      Susceptibility   Pseudomonas aeruginosa - MIC*    CEFTAZIDIME 4 SENSITIVE Sensitive     CIPROFLOXACIN <=0.25 SENSITIVE Sensitive     GENTAMICIN <=1 SENSITIVE Sensitive     IMIPENEM 1 SENSITIVE Sensitive     PIP/TAZO <=4 SENSITIVE Sensitive     CEFEPIME 2 SENSITIVE Sensitive     * 70,000 COLONIES/mL PSEUDOMONAS AERUGINOSA    [x]  Patient discharged originally without antimicrobial agent  New antibiotic prescription: No further treatment recommended at this time for asymptomatic bacteriuria.  ED Provider: Burna Forts, PA-C   Almon Hercules 04/08/2018, 9:36 AM Clinical Pharmacist Monday - Friday phone -  [phone] Saturday - Sunday phone - [phone]

## 2018-04-08 NOTE — Telephone Encounter (Signed)
Post ED Visit - Positive Culture Follow-up  Culture report reviewed by antimicrobial stewardship pharmacist: [facility] []  Enzo Bi, Pharm.D. []  Celedonio Miyamoto, Pharm.D., BCPS AQ-ID []  Garvin Fila, Pharm.D., BCPS []  Georgina Pillion, [address].D., BCPS []  [place], [address].D., BCPS, AAHIVP []  Estella Husk, Pharm.D., BCPS, AAHIVP []  Lysle Pearl, PharmD, BCPS []  Phillips Climes, PharmD, BCPS []  Agapito Games, PharmD, BCPS []  Verlan Friends, PharmD []  Mervyn Gay, PharmD, BCPS []  Vinnie Level, PharmD Babs Bertin PharmD  [facility] []  Len Childs, PharmD []  Greer Pickerel, PharmD []  Adalberto Cole, PharmD []  Perlie Gold, Rph []  Lonell Face) Jean Rosenthal, PharmD []  Earl Many, PharmD []  Junita Push, PharmD []  Dorna Leitz, PharmD []  Terrilee Files, PharmD []  Lynann Beaver, PharmD []  Keturah Barre, PharmD []  Loralee Pacas, PharmD []  Bernadene Person, PharmD   Positive urine culture Treated with none, asymptomatic, and no further patient follow-up is required at this time.  Berle Mull 04/08/2018, 10:28 AM

## 2018-05-05 ENCOUNTER — Encounter: Payer: Self-pay | Admitting: Family Medicine

## 2018-07-31 ENCOUNTER — Ambulatory Visit: Payer: Medicare Other | Admitting: Neurology

## 2019-01-07 ENCOUNTER — Emergency Department (HOSPITAL_COMMUNITY)
Admission: EM | Admit: 2019-01-07 | Discharge: 2019-01-07 | Disposition: A | Discharge status: Home / Self Care | Payer: Medicare Other | Attending: Emergency Medicine | Admitting: Emergency Medicine

## 2019-01-07 ENCOUNTER — Encounter (HOSPITAL_COMMUNITY): Payer: Self-pay

## 2019-01-07 ENCOUNTER — Other Ambulatory Visit: Payer: Self-pay

## 2019-01-07 DIAGNOSIS — R410 Disorientation, unspecified: Secondary | ICD-10-CM

## 2019-01-07 DIAGNOSIS — Z79899 Other long term (current) drug therapy: Secondary | ICD-10-CM | POA: Insufficient documentation

## 2019-01-07 DIAGNOSIS — I10 Essential (primary) hypertension: Secondary | ICD-10-CM | POA: Diagnosis not present

## 2019-01-07 DIAGNOSIS — F0390 Unspecified dementia without behavioral disturbance: Secondary | ICD-10-CM | POA: Diagnosis not present

## 2019-01-07 DIAGNOSIS — R4182 Altered mental status, unspecified: Secondary | ICD-10-CM | POA: Diagnosis not present

## 2019-01-07 DIAGNOSIS — R404 Transient alteration of awareness: Secondary | ICD-10-CM | POA: Diagnosis not present

## 2019-01-07 DIAGNOSIS — Z743 Need for continuous supervision: Secondary | ICD-10-CM | POA: Diagnosis not present

## 2019-01-07 DIAGNOSIS — R279 Unspecified lack of coordination: Secondary | ICD-10-CM | POA: Diagnosis not present

## 2019-01-07 DIAGNOSIS — R5381 Other malaise: Secondary | ICD-10-CM | POA: Diagnosis not present

## 2019-01-07 LAB — COMPREHENSIVE METABOLIC PANEL
ALT: 39 U/L (ref 0–44)
AST: 45 U/L — ABNORMAL HIGH (ref 15–41)
Albumin: 3.7 g/dL (ref 3.5–5.0)
Alkaline Phosphatase: 92 U/L (ref 38–126)
Anion gap: 7 (ref 5–15)
BUN: 17 mg/dL (ref 8–23)
CO2: 26 mmol/L (ref 22–32)
Calcium: 9.6 mg/dL (ref 8.9–10.3)
Chloride: 104 mmol/L (ref 98–111)
Creatinine, Ser: 0.81 mg/dL (ref 0.44–1.00)
GFR calc Af Amer: >60 mL/min (ref >60)
GFR calc non Af Amer: >60 mL/min (ref >60)
Glucose, Bld: 94 mg/dL (ref 70–99)
Potassium: 3.9 mmol/L (ref 3.5–5.1)
Sodium: 137 mmol/L (ref 135–145)
Total Bilirubin: 0.7 mg/dL (ref 0.3–1.2)
Total Protein: 6.1 g/dL — ABNORMAL LOW (ref 6.5–8.1)

## 2019-01-07 LAB — CBC WITH DIFFERENTIAL/PLATELET
Abs Immature Granulocytes: 0.01 10*3/uL (ref 0.00–0.07)
Basophils Absolute: 0 10*3/uL (ref 0.0–0.1)
Basophils Relative: 0 %
Eosinophils Absolute: 0.1 10*3/uL (ref 0.0–0.5)
Eosinophils Relative: 2 %
HCT: 33.4 % — ABNORMAL LOW (ref 36.0–46.0)
Hemoglobin: 10.7 g/dL — ABNORMAL LOW (ref 12.0–15.0)
Immature Granulocytes: 0 %
Lymphocytes Relative: 35 %
Lymphs Abs: 1.9 10*3/uL (ref 0.7–4.0)
MCH: 33.3 pg (ref 26.0–34.0)
MCHC: 32 g/dL (ref 30.0–36.0)
MCV: 104 fL — ABNORMAL HIGH (ref 80.0–100.0)
Monocytes Absolute: 0.7 10*3/uL (ref 0.1–1.0)
Monocytes Relative: 13 %
Neutro Abs: 2.7 10*3/uL (ref 1.7–7.7)
Neutrophils Relative %: 50 %
Platelets: 135 10*3/uL — ABNORMAL LOW (ref 150–400)
RBC: 3.21 MIL/uL — ABNORMAL LOW (ref 3.87–5.11)
RDW: 12.9 % (ref 11.5–15.5)
WBC: 5.4 10*3/uL (ref 4.0–10.5)
nRBC: 0 % (ref 0.0–0.2)

## 2019-01-07 LAB — URINALYSIS, ROUTINE W REFLEX MICROSCOPIC
Bilirubin Urine: NEGATIVE
Glucose, UA: NEGATIVE mg/dL
Hgb urine dipstick: NEGATIVE
Ketones, ur: NEGATIVE mg/dL
Nitrite: NEGATIVE
Protein, ur: NEGATIVE mg/dL
Specific Gravity, Urine: 1.014 (ref 1.005–1.030)
WBC, UA: >50 WBC/hpf — ABNORMAL HIGH (ref 0–5)
pH: 7 (ref 5.0–8.0)

## 2019-01-07 MED ORDER — SODIUM CHLORIDE 0.9 % IV BOLUS
500.0000 mL | Freq: Once | INTRAVENOUS | Status: AC
  Administered 2019-01-07: 03:00:00 500 mL via INTRAVENOUS

## 2019-01-07 NOTE — ED Notes (Signed)
PTAR present to transport patient back to facility. 

## 2019-01-07 NOTE — ED Notes (Signed)
[facility] notified of need for transport of pt back to [facility].

## 2019-01-07 NOTE — Discharge Instructions (Addendum)
The patient's exam is unremarkable with the exception of confused thinking, c/w h/o dementia. No evidence of infection to explain the patient's behavior earlier this evening of trying to leave the facility.   Please have the patient's doctor re-examine/recheck to determine if further evaluation in the outpatient setting is necessary.

## 2019-01-07 NOTE — ED Provider Notes (Signed)
[facility]-EMERGENCY DEPT Provider Note   CSN: [id number] Arrival date & time: 01/07/19  0209     History   Chief Complaint Chief Complaint  Patient presents with  . Altered Mental Status    HPI [name] is a 84 y.o. female.     Patient to ED by EMS from nursing home who reported altered mental status described as the patient wandering, attempting to leave the facility. The patient has a history of dementia. She denies pain. She does not understand why she is here. Additional information from the facility is obtained and confirms the change in behavior was the reason for the transport with stated concern for possible UTI causing change. No reported fever, vomiting or complaint of pain from the patient. Her level of confusion is consistent with her dementia, per staff.   The history is provided by the patient. No language interpreter was used.    Past Medical History:  Diagnosis Date  . Chronic pain   . DDD (degenerative disc disease)   . Diverticulosis   . Hyperlipidemia   . Hypertension   . Osteopenia   . Stroke (HCC)   . Syncope     Patient Active Problem List   Diagnosis Date Noted  . Cervical spine fracture (HCC) 12/18/2017  . Weight loss 12/18/2017  . Vascular dementia (HCC) 12/18/2017  . Hearing loss 01/09/2016  . Routine general medical examination at a health care facility 12/31/2015  . Dysuria 09/29/2015  . Anxiety and depression 09/21/2014  . Pedal edema 06/22/2014  . Encounter for Medicare annual wellness exam 03/18/2014  . Retinal vein occlusion 03/18/2014  . B12 deficiency 03/15/2014  . Colon cancer screening 04/19/2013  . At risk for falling 12/02/2011  . Hyperparathyroidism (HCC) 11/20/2011  . Back pain, thoracic 09/30/2011  . Scoliosis 09/30/2011  . Degenerative disc disease, lumbar 09/30/2011  . Renal insufficiency 05/27/2011  . Syncope 12/05/2010  . Cerebral artery occlusion with cerebral infarction (HCC)  02/26/2008  . Hyperlipidemia 05/12/2006  . Essential hypertension 05/12/2006  . DIVERTICULOSIS, COLON 05/12/2006  . Osteopenia 05/12/2006    Past Surgical History:  Procedure Laterality Date  . ABDOMINAL HYSTERECTOMY    . carotid doppler     normal  . DOPPLER ECHOCARDIOGRAPHY     normal  . EXPLORATORY LAPAROTOMY     2 times both neg   . LAMINECTOMY  06-2003   l2-l4  . SPINE SURGERY     l3-l4 microdiscectomy   . TONSILLECTOMY       OB History   No obstetric history on file.      Home Medications    Prior to Admission medications   Medication Sig Start Date End Date Taking? Authorizing Provider  acetaminophen (TYLENOL) 325 MG tablet Take 650 mg by mouth every 6 (six) hours as needed.    [provider]  Ascorbic Acid (VITAMIN C PO) Take 1 tablet by mouth.    [provider]  atorvastatin (LIPITOR) 40 MG tablet Take 1 tablet (40 mg total) by mouth at bedtime. 02/25/17 02/25/18  Tower, Audrie Gallus, MD  Brinzolamide-Brimonidine [facility]) 1-0.2 % SUSP Place 1 drop into the right eye 3 (three) times daily.     [provider]  Cholecalciferol (VITAMIN D PO) Take 1 tablet by mouth daily.    [provider]  cyanocobalamin 1000 MCG tablet Take 1,000 mcg by mouth daily.    [provider]  fish oil-omega-3 fatty acids 1000 MG capsule Take 1 g  by mouth daily.    [provider]  HYDROcodone-acetaminophen (NORCO/VICODIN) 5-325 MG tablet Take 1 tablet by mouth every 6 (six) hours as needed. 04/05/18   Ward, Layla Maw, DO  losartan (COZAAR) 100 MG tablet TAKE 1/2 TABLET (50 MG TOTAL) BY MOUTH DAILY. 02/25/17   Tower, Audrie Gallus, MD  Melatonin 3 MG CAPS Take 2 capsules by mouth at bedtime.    [provider]  ondansetron (ZOFRAN ODT) 4 MG disintegrating tablet Take 1 tablet (4 mg total) by mouth every 6 (six) hours as needed. 04/05/18   Ward, Layla Maw, DO  sertraline (ZOLOFT) 50 MG tablet Take 1 tablet (50 mg total) by mouth daily.  01/27/18   Tower, Audrie Gallus, MD    Family History Family History  Problem Relation Age of Onset  . Hypertension Father   . COPD Father   . Other Mother        Deceased, 3  . Healthy Brother     Social History Social History   Tobacco Use  . Smoking status: Never Smoker  . Smokeless tobacco: Never Used  Substance Use Topics  . Alcohol use: No    Alcohol/week: 0.0 standard drinks  . Drug use: No     Allergies   Celecoxib, Codeine, Lisinopril, and Etodolac   Review of Systems Review of Systems  Unable to perform ROS: Dementia     Physical Exam Updated Vital Signs BP (!) 162/88 (BP Location: Left Arm)   Pulse (!) 115   Temp 98.1 F (36.7 C) (Oral)   Resp 18   SpO2 97%   Physical Exam Constitutional:      General: She is not in acute distress.    Appearance: Normal appearance.  HENT:     Head: Atraumatic.     Mouth/Throat:     Mouth: Mucous membranes are moist.  Neck:     Musculoskeletal: Normal range of motion and neck supple.  Cardiovascular:     Rate and Rhythm: Normal rate and regular rhythm.     Heart sounds: No murmur.  Pulmonary:     Effort: Pulmonary effort is normal. No respiratory distress.     Breath sounds: No wheezing, rhonchi or rales.  Chest:     Chest wall: No tenderness.  Abdominal:     General: There is no distension.     Palpations: Abdomen is soft. There is no mass.     Tenderness: There is no abdominal tenderness.  Musculoskeletal: Normal range of motion.  Skin:    General: Skin is warm and dry.  Neurological:     Mental Status: She is alert.     Comments: Confused thought process. Follows commands, pleasant, cooperative. Speech clear. No deficit in coordination.      ED Treatments / Results  Labs (all labs ordered are listed, but only abnormal results are displayed) Labs Reviewed  URINE CULTURE  CBC WITH DIFFERENTIAL/PLATELET  COMPREHENSIVE METABOLIC PANEL  URINALYSIS, ROUTINE W REFLEX MICROSCOPIC   Results for  orders placed or performed during the hospital encounter of 01/07/19  Urine culture   Specimen: Urine, Random  Result Value Ref Range   Specimen Description      URINE, RANDOM Performed at [facility], [address], [place], [place] [address]    Special Requests      NONE Performed at [facility], [address] [place], [place] [address]    Culture MULTIPLE SPECIES PRESENT, SUGGEST RECOLLECTION (A)    Report Status  01/08/2019 FINAL   CBC with Differential  Result Value Ref Range   WBC 5.4 4.0 - 10.5 K/uL   RBC 3.21 (L) 3.87 - 5.11 MIL/uL   Hemoglobin 10.7 (L) 12.0 - 15.0 g/dL   HCT 82.933.4 (L) 56.236.0 - 13.046.0 %   MCV 104.0 (H) 80.0 - 100.0 fL   MCH 33.3 26.0 - 34.0 pg   MCHC 32.0 30.0 - 36.0 g/dL   RDW 86.512.9 78.411.5 - 69.615.5 %   Platelets 135 (L) 150 - 400 K/uL   nRBC 0.0 0.0 - 0.2 %   Neutrophils Relative % 50 %   Neutro Abs 2.7 1.7 - 7.7 K/uL   Lymphocytes Relative 35 %   Lymphs Abs 1.9 0.7 - 4.0 K/uL   Monocytes Relative 13 %   Monocytes Absolute 0.7 0.1 - 1.0 K/uL   Eosinophils Relative 2 %   Eosinophils Absolute 0.1 0.0 - 0.5 K/uL   Basophils Relative 0 %   Basophils Absolute 0.0 0.0 - 0.1 K/uL   Immature Granulocytes 0 %   Abs Immature Granulocytes 0.01 0.00 - 0.07 K/uL  Comprehensive metabolic panel  Result Value Ref Range   Sodium 137 135 - 145 mmol/L   Potassium 3.9 3.5 - 5.1 mmol/L   Chloride 104 98 - 111 mmol/L   CO2 26 22 - 32 mmol/L   Glucose, Bld 94 70 - 99 mg/dL   BUN 17 8 - 23 mg/dL   Creatinine, Ser 2.950.81 0.44 - 1.00 mg/dL   Calcium 9.6 8.9 - 28.410.3 mg/dL   Total Protein 6.1 (L) 6.5 - 8.1 g/dL   Albumin 3.7 3.5 - 5.0 g/dL   AST 45 (H) 15 - 41 U/L   ALT 39 0 - 44 U/L   Alkaline Phosphatase 92 38 - 126 U/L   Total Bilirubin 0.7 0.3 - 1.2 mg/dL   GFR calc non Af Amer >60 >60 mL/min   GFR calc Af Amer >60 >60 mL/min   Anion gap 7 5 - 15  Urinalysis, Routine w reflex microscopic  Result Value Ref Range   Color, Urine YELLOW YELLOW    APPearance CLEAR CLEAR   Specific Gravity, Urine 1.014 1.005 - 1.030   pH 7.0 5.0 - 8.0   Glucose, UA NEGATIVE NEGATIVE mg/dL   Hgb urine dipstick NEGATIVE NEGATIVE   Bilirubin Urine NEGATIVE NEGATIVE   Ketones, ur NEGATIVE NEGATIVE mg/dL   Protein, ur NEGATIVE NEGATIVE mg/dL   Nitrite NEGATIVE NEGATIVE   Leukocytes,Ua LARGE (A) NEGATIVE   RBC / HPF 0-5 0 - 5 RBC/hpf   WBC, UA >50 (H) 0 - 5 WBC/hpf   Bacteria, UA RARE (A) NONE SEEN   Squamous Epithelial / LPF 0-5 0 - 5   Mucus PRESENT      EKG None  Radiology No results found.  Procedures Procedures (including critical care time)  Medications Ordered in ED Medications  sodium chloride 0.9 % bolus 500 mL (has no administration in time range)     Initial Impression / Assessment and Plan / ED Course  I have reviewed the triage vital signs and the nursing notes.  Pertinent labs & imaging results that were available during my care of the patient were reviewed by me and considered in my medical decision making (see chart for details).        The patient is here from nursing facility who was concerned for UTI based on change in her behavior. No change in baseline confused mental status, report of fever, vomiting,  complaint of pain.   The patient is very well appearing. Labs are essentially unremarkable. She has >50 WBCs in her urine but rare bacteria. Nitrite negative. No fever, no leukocytosis. Will wait for cultures to decide if treatment is required.   She is stable and appropriate for discharge back to the facility.  Final Clinical Impressions(s) / ED Diagnoses   Final diagnoses:  None   1. Dementia   ED Discharge Orders    None       Charlann Lange, Hershal Coria 01/08/19 2301    Maudie Flakes, MD 01/11/19 [phone]

## 2019-01-07 NOTE — ED Triage Notes (Signed)
Per EMS, patient from [facility]. Staff reports increased altered mental status, however, unable to verify what pt's baseline is. Staff reports pt attempted to leave the facility. Per EMS, pt is a&ox1 and ambulatory with x1 assistance.

## 2019-01-08 LAB — URINE CULTURE

## 2019-01-13 ENCOUNTER — Telehealth: Payer: Self-pay | Admitting: *Deleted

## 2019-01-13 NOTE — Telephone Encounter (Signed)
Melinda nurse at [facility] called stating that she faxed over a FL2 form that needs to be completed by Dr. Glori Bickers. Rip Harbour sated that patient is wondering and the family is on board to move her to [facility] unit for her safety. Rip Harbour wants to make sure that Dr. Glori Bickers received the fax that was sent over Monday? Rip Harbour stated that she is anxious to get this completed FL2 back.

## 2019-01-13 NOTE — Telephone Encounter (Signed)
I signed it this am- I believe it was already filled out - it was a difficult copy to read.   Let me know if I need to do anything else

## 2019-01-13 NOTE — Telephone Encounter (Signed)
I did fax it earlier and my fax confirmed it went through. Called [facility] and advised her that I faxed it earlier today. She said they never received it and gave me a different fax # to send it to [phone], I re-faxed it to that # and again it went through on my end and my fax machine confirmed it went through

## 2019-02-10 ENCOUNTER — Encounter: Payer: Self-pay | Admitting: Family Medicine

## 2019-02-24 ENCOUNTER — Encounter: Payer: Self-pay | Admitting: Family Medicine

## 2019-03-01 NOTE — Telephone Encounter (Signed)
Order faxed.

## 2019-03-16 ENCOUNTER — Telehealth: Payer: Self-pay | Admitting: *Deleted

## 2019-03-16 NOTE — Telephone Encounter (Signed)
Called Tammy and she advised me that pt had to be moved to the dementia ward in her nursing home because her dementia is so severe now. Pt tried to excape twice and the staff stopped her before she went to far. Pt is hardly eating she's down to 110 lbs and does need help with her ADL, I went over which ones with Tammy and she advise me it's bathing, dressing, and transferring, pt for the most part will still use the bathroom and she will feed herself when she does want to eat so I left those blank.  I also checked "yes" to the 2nd question given what Tammy said but there is a space for additional comments, Tammy wanted to see if Dr. Milinda Antis wanted to put anything right there to help  Form placed back in inbox

## 2019-03-16 NOTE — Telephone Encounter (Signed)
Left VM requesting Tammy to call the office regarding a form we have for pt, Dr. Milinda Antis has some questions on it

## 2019-03-16 NOTE — Telephone Encounter (Signed)
Tammy returned [place]'s call. Tammy can be reached at [phone].

## 2019-03-16 NOTE — Telephone Encounter (Signed)
Form faxed

## 2019-03-16 NOTE — Telephone Encounter (Signed)
Thanks done and in IN box 

## 2019-03-23 DIAGNOSIS — Z20828 Contact with and (suspected) exposure to other viral communicable diseases: Secondary | ICD-10-CM | POA: Diagnosis not present

## 2019-03-24 ENCOUNTER — Other Ambulatory Visit: Payer: Self-pay | Admitting: Family Medicine

## 2019-03-24 MED ORDER — SERTRALINE HCL 50 MG PO TABS: ORAL_TABLET | ORAL | 3 refills | Status: DC

## 2019-03-24 NOTE — Telephone Encounter (Signed)
I received a note from pt's residence [facility]) regarding her increased dementia symptoms including agitation/ hallucinations and trying to get out at night   Her niece Satira Mccallum is aware and also visited her yesterday   We discussed our options-aware that they are limited pharmacologically for safety  I noted she stopped her sertraline last month and perhaps anxiety is causing some problems-so first I would like to get that re started   We can also check a UA if needed (last uc was neg)   If no improvement and she is a danger to herself we could consider an anti psychotic like low dose seroquel (very aware of risks of mortality/sedation and falls with this)   Tammy was ok with whatever we want to try at this time I also did flag her neurologist Dr Allena Katz to see what her thoughts were (pretty much the same) I plan to return the fax to [facility] to start with the sertraline and update me if no improvement   I pended px in case it needs to be sent

## 2019-03-24 NOTE — Telephone Encounter (Signed)
Spoke with [name] at [facility], she received the fax and request I send the Rx to [facility] the pharmacy they use. done

## 2019-03-30 ENCOUNTER — Telehealth: Payer: Self-pay | Admitting: *Deleted

## 2019-03-30 DIAGNOSIS — Z20828 Contact with and (suspected) exposure to other viral communicable diseases: Secondary | ICD-10-CM | POA: Diagnosis not present

## 2019-03-30 NOTE — Telephone Encounter (Signed)
Order faxed.

## 2019-03-30 NOTE — Telephone Encounter (Signed)
[name] from [facility] called stating that they have an order to discontinue patient's hearing aids because they had been misplaced. [name] stated that they have located the patient's hearing aids and would like another order to continue. [name] stated that the order needs to say insert hearing aids in ears in the am and remove in the evenings and place on the med chart. [name] requested that the written order be faxed to [phone].

## 2019-03-30 NOTE — Telephone Encounter (Signed)
Order is done and in IN box °

## 2019-04-06 DIAGNOSIS — Z20828 Contact with and (suspected) exposure to other viral communicable diseases: Secondary | ICD-10-CM | POA: Diagnosis not present

## 2019-04-07 ENCOUNTER — Telehealth: Payer: Self-pay

## 2019-04-07 NOTE — Telephone Encounter (Signed)
Per prev message they can't take a verbal order, they need a written order to do UA C&S, I verified they cant do it until we fax them the order. Please write an order I can fax them. Thanks

## 2019-04-07 NOTE — Telephone Encounter (Signed)
Order and form that came from nursing home faxed back

## 2019-04-07 NOTE — Telephone Encounter (Signed)
[name] with [facility] said pt has been more agitated today and [name] request written order for U/A & C&S to see if pt has UTI faxed to [phone] or [phone] 2 fax #s due to having problems with fax today.No other symptoms noted and no odor to urine and no fever.

## 2019-04-07 NOTE — Telephone Encounter (Signed)
Order is in IN box 

## 2019-04-07 NOTE — Telephone Encounter (Signed)
Please do the UA C and S and send when results return.  Thanks

## 2019-05-12 ENCOUNTER — Telehealth: Payer: Self-pay | Admitting: Family Medicine

## 2019-05-12 NOTE — Telephone Encounter (Signed)
Thanks for letting me know- please have them remind me when she comes for visit.   If they need a prior one that is fine as well

## 2019-05-12 NOTE — Telephone Encounter (Signed)
Spoke with Misty Stanley and advise her that pt's appt that was scheduled on 04/08/19 was canceled. appt was r/s to 05/20/19 and we can send that OV note to her once it's complete. Misty Stanley request OV note sent to fax # [phone] Chip Boer)  FYI to PCP

## 2019-05-12 NOTE — Telephone Encounter (Signed)
LISA wanted to make sure you received fax request last office note  She had not received note yet

## 2019-05-13 NOTE — Telephone Encounter (Signed)
Misty Stanley only needs appt notes from upcoming visit. I placed a note on pt's appt notes to remind [place] and also a staff message to myself the day of to send OV note

## 2019-05-18 ENCOUNTER — Telehealth: Payer: Self-pay | Admitting: Family Medicine

## 2019-05-18 DIAGNOSIS — Z20828 Contact with and (suspected) exposure to other viral communicable diseases: Secondary | ICD-10-CM | POA: Diagnosis not present

## 2019-05-18 NOTE — Telephone Encounter (Signed)
I was holding this on my desk (and was out of the office last week), because it notes she has appt with me on 4/8 (I was going to do the paperwork while she was here)    if they cannot wait I will take a look at it  I am routing this back to myself as well

## 2019-05-18 NOTE — Telephone Encounter (Signed)
MAries with [facility] called to see if you have received paperwork from them She stated she has faxed them over several times and has not received those back  She stated that it is so Dr Milinda Antis can review all medications patient is taking and can sign off that everything looks correct.   Please advise   Ph- [phone]  [name] said if she is not available to ask for [facility]

## 2019-05-18 NOTE — Telephone Encounter (Signed)
Spoke with [facility] and they are okay waiting for Dr. Milinda Antis to do ppw when pt comes in on 05/20/19

## 2019-05-20 ENCOUNTER — Ambulatory Visit (INDEPENDENT_AMBULATORY_CARE_PROVIDER_SITE_OTHER): Payer: Medicare Other | Admitting: Family Medicine

## 2019-05-20 ENCOUNTER — Other Ambulatory Visit: Payer: Self-pay

## 2019-05-20 ENCOUNTER — Encounter: Payer: Self-pay | Admitting: Family Medicine

## 2019-05-20 ENCOUNTER — Telehealth: Payer: Self-pay | Admitting: Family Medicine

## 2019-05-20 VITALS — BP 130/72 | HR 62 | Temp 97.5°F | Wt 108.1 lb

## 2019-05-20 DIAGNOSIS — Z111 Encounter for screening for respiratory tuberculosis: Secondary | ICD-10-CM

## 2019-05-20 DIAGNOSIS — M8588 Other specified disorders of bone density and structure, other site: Secondary | ICD-10-CM

## 2019-05-20 DIAGNOSIS — Z0000 Encounter for general adult medical examination without abnormal findings: Secondary | ICD-10-CM | POA: Diagnosis not present

## 2019-05-20 DIAGNOSIS — F329 Major depressive disorder, single episode, unspecified: Secondary | ICD-10-CM

## 2019-05-20 DIAGNOSIS — F0150 Vascular dementia without behavioral disturbance: Secondary | ICD-10-CM

## 2019-05-20 DIAGNOSIS — H348192 Central retinal vein occlusion, unspecified eye, stable: Secondary | ICD-10-CM

## 2019-05-20 DIAGNOSIS — I10 Essential (primary) hypertension: Secondary | ICD-10-CM | POA: Diagnosis not present

## 2019-05-20 DIAGNOSIS — E213 Hyperparathyroidism, unspecified: Secondary | ICD-10-CM | POA: Diagnosis not present

## 2019-05-20 DIAGNOSIS — R634 Abnormal weight loss: Secondary | ICD-10-CM

## 2019-05-20 DIAGNOSIS — Z9181 History of falling: Secondary | ICD-10-CM

## 2019-05-20 DIAGNOSIS — E538 Deficiency of other specified B group vitamins: Secondary | ICD-10-CM

## 2019-05-20 DIAGNOSIS — H9193 Unspecified hearing loss, bilateral: Secondary | ICD-10-CM

## 2019-05-20 DIAGNOSIS — F419 Anxiety disorder, unspecified: Secondary | ICD-10-CM

## 2019-05-20 DIAGNOSIS — F32A Depression, unspecified: Secondary | ICD-10-CM

## 2019-05-20 DIAGNOSIS — E7800 Pure hypercholesterolemia, unspecified: Secondary | ICD-10-CM

## 2019-05-20 LAB — CBC WITH DIFFERENTIAL/PLATELET
Basophils Absolute: 0 10*3/uL (ref 0.0–0.1)
Basophils Relative: 0.3 % (ref 0.0–3.0)
Eosinophils Absolute: 0.1 10*3/uL (ref 0.0–0.7)
Eosinophils Relative: 2 % (ref 0.0–5.0)
HCT: 36 % (ref 36.0–46.0)
Hemoglobin: 12 g/dL (ref 12.0–15.0)
Lymphocytes Relative: 26.7 % (ref 12.0–46.0)
Lymphs Abs: 1.3 10*3/uL (ref 0.7–4.0)
MCHC: 33.5 g/dL (ref 30.0–36.0)
MCV: 101.2 fl — ABNORMAL HIGH (ref 78.0–100.0)
Monocytes Absolute: 0.6 10*3/uL (ref 0.1–1.0)
Monocytes Relative: 11.4 % (ref 3.0–12.0)
Neutro Abs: 2.9 10*3/uL (ref 1.4–7.7)
Neutrophils Relative %: 59.6 % (ref 43.0–77.0)
Platelets: 126 10*3/uL — ABNORMAL LOW (ref 150.0–400.0)
RBC: 3.55 Mil/uL — ABNORMAL LOW (ref 3.87–5.11)
RDW: 14.3 % (ref 11.5–15.5)
WBC: 4.9 10*3/uL (ref 4.0–10.5)

## 2019-05-20 LAB — COMPREHENSIVE METABOLIC PANEL
ALT: 43 U/L — ABNORMAL HIGH (ref 0–35)
AST: 35 U/L (ref 0–37)
Albumin: 3.8 g/dL (ref 3.5–5.2)
Alkaline Phosphatase: 89 U/L (ref 39–117)
BUN: 18 mg/dL (ref 6–23)
CO2: 31 mEq/L (ref 19–32)
Calcium: 10 mg/dL (ref 8.4–10.5)
Chloride: 102 mEq/L (ref 96–112)
Creatinine, Ser: 0.92 mg/dL (ref 0.40–1.20)
GFR: 57.72 mL/min — ABNORMAL LOW (ref >60.00)
Glucose, Bld: 107 mg/dL — ABNORMAL HIGH (ref 70–99)
Potassium: 4.4 mEq/L (ref 3.5–5.1)
Sodium: 139 mEq/L (ref 135–145)
Total Bilirubin: 0.6 mg/dL (ref 0.2–1.2)
Total Protein: 6 g/dL (ref 6.0–8.3)

## 2019-05-20 LAB — LIPID PANEL
Cholesterol: 166 mg/dL (ref 0–200)
HDL: 84.1 mg/dL (ref >39.00)
LDL Cholesterol: 70 mg/dL (ref 0–99)
NonHDL: 81.59
Total CHOL/HDL Ratio: 2
Triglycerides: 58 mg/dL (ref 0.0–149.0)
VLDL: 11.6 mg/dL (ref 0.0–40.0)

## 2019-05-20 LAB — TSH: TSH: 0.84 u[IU]/mL (ref 0.35–4.50)

## 2019-05-20 LAB — VITAMIN B12: Vitamin B-12: 1045 pg/mL — ABNORMAL HIGH (ref 211–911)

## 2019-05-20 NOTE — Assessment & Plan Note (Signed)
Back on zoloft and doing fairly well (in setting of vascular dementia)

## 2019-05-20 NOTE — Assessment & Plan Note (Signed)
Wearing hearing aides  Has help to take them in/out and store them safely now

## 2019-05-20 NOTE — Assessment & Plan Note (Signed)
bp in fair control at this time  BP Readings from Last 1 Encounters:  05/20/19 130/72   No changes needed Most recent labs reviewed  Disc lifstyle change with low sodium diet and exercise

## 2019-05-20 NOTE — Telephone Encounter (Signed)
Lawson Fiscal with [employer] called today  She stated the patient had an appointment today and she sent over the [facility] paperwork  She is requesting the FL2 addendum ,Tb test results  And lab results faxed over to her for the patient to move into the assistant living facility    Fax- [phone] Ph- [phone]

## 2019-05-20 NOTE — Assessment & Plan Note (Signed)
Last ca level was normal  No clinical changes  Labs today

## 2019-05-20 NOTE — Assessment & Plan Note (Signed)
Blind in this eye now  Has adjust well  Also with dementia-moving into a new facility for memory care and will have help with ADLs

## 2019-05-20 NOTE — Patient Instructions (Addendum)
Please call and let us know what the dates of covid vaccines   I have to know what level of care she is transferring to  Home Skilled nursing facility  Intermediate care facility Hospital  Rest home  Other   (i'm not sure what her memory care level falls under)

## 2019-05-20 NOTE — Assessment & Plan Note (Signed)
Declines any further eval/tx at this time Taking vit D  Has had a cervical fx in the past  Uses walker to prevent falls

## 2019-05-20 NOTE — Assessment & Plan Note (Signed)
Low weight noted Eating less with dementia - hopefull she will do better in new memory care unit  Emphasized importance of protein calories and reminders to eat/drink

## 2019-05-20 NOTE — Assessment & Plan Note (Signed)
Reviewed health habits including diet and exercise and skin cancer prevention Reviewed appropriate screening tests for age  Also reviewed health mt list, fam hx and immunization status , as well as social and family history   See HPI Labs ordered  Disc need for more protein calories for wt gain  Forms done for admit to new memory care unit Declines cancer screening or dexa due to age Had her covid vaccine series  Vascular dementia and hearing loss progress  No new falls in setting of close supervision with ADL assistance and walker

## 2019-05-20 NOTE — Assessment & Plan Note (Signed)
Continues use of walker

## 2019-05-20 NOTE — Assessment & Plan Note (Signed)
In pt with h/o vascular dementia  Takes atorvastatin  Prev well controlled Disc goals for lipids and reasons to control them Rev last labs with pt Rev low sat fat diet in detail  New lipid profile drawn today

## 2019-05-20 NOTE — Telephone Encounter (Signed)
Synetta Fail called in. She came with patient to her appt today and she was supposed to call back with updates. She states Karenna received her first covid shot on 2/7 and the second on 3/7. She also said she called to find out what level of care would be necessary and the nursing home told her to mark Other and put Memory Care in the notes.

## 2019-05-20 NOTE — Assessment & Plan Note (Signed)
Taking 1000 mcg B12 oral qd Level drawn today

## 2019-05-20 NOTE — Assessment & Plan Note (Signed)
This is progressive  Short term memory loss and cognitive decline  Mood is ok  Has needed some help with ADLs  Paperwork filled out for admit to new memory care facility

## 2019-05-20 NOTE — Assessment & Plan Note (Addendum)
Needed for admit to memory care unit  TB gold test today

## 2019-05-20 NOTE — Progress Notes (Signed)
Subjective:    Patient ID: [name], female    DOB: 12-26-1930, 86 y.o.   MRN: [id number]  This visit occurred during the SARS-CoV-2 public health emergency.  Safety protocols were in place, including screening questions prior to the visit, additional usage of staff PPE, and extensive cleaning of exam room while observing appropriate contact time as indicated for disinfecting solutions.     HPI Pt presents to complete paperwork for senior living facility and for annual exam   Wt Readings from Last 3 Encounters:  05/20/19 108 lb 1 oz (49 kg)  01/07/19 110 lb 8 oz (50.1 kg)  04/05/18 121 lb 7.6 oz (55.1 kg)  lost a lot of weight after a cervical fracture (and depression)  Appetite is back  17.44 kg/m   Moving from [facility] to to [employer] care /skilled   No major changes in medical hx  Dementia progresses as expected  Easier to get confused No hallucinations  She has wandered in the past   Needs some help with dressing (just buttons)  Resists bathing but can do most of it herself  Ambulatory with walker   Likes to be social   Declines cancer screening at her age Also dexa   imms - has had both her covid vaccines   Needs TB blood test for admit to new facility  H/o HTN with past cva and retinal vein occlusion bp is stable today  No cp or palpitations or headaches or edema  No side effects to medicines  BP Readings from Last 3 Encounters:  05/20/19 (!) 142/72  01/07/19 (!) 167/80  04/05/18 (!) 175/99    Re check BP: 130/72    H/o hyperparathyroidism  Lab Results  Component Value Date   CALCIUM 9.6 01/07/2019   PHOS 2.7 02/25/2017   Normal levels last   Hearing loss- had hearing aides  Is blind in one eye and vision is ok in the other (retinal vein occlusion)   Vascular dementia -progressive  ADLs  Has osteopenia  Falls  Fractures  Supplements -takes vitamin D   Mood- trial off of zoloft and now back on  Family has not seen her  much due to pandemic  Mood has been good lately    Renal insuff in past Lab Results  Component Value Date   CREATININE 0.81 01/07/2019   BUN 17 01/07/2019   NA 137 01/07/2019   K 3.9 01/07/2019   CL 104 01/07/2019   CO2 26 01/07/2019   Likes tea and coffee and ginger ale   Past B12 def Lab Results  Component Value Date   VITAMINB12 >1500 (H) 02/25/2017   Hyperlipidemia Lab Results  Component Value Date   CHOL 142 02/25/2017   HDL 82.40 02/25/2017   LDLCALC 45 02/25/2017   LDLDIRECT 121.0 03/17/2013   TRIG 74.0 02/25/2017   CHOLHDL 2 02/25/2017   Atorvastatin -tolerates well  Patient Active Problem List   Diagnosis Date Noted  . Screening-pulmonary TB 05/20/2019  . Cervical spine fracture (HCC) 12/18/2017  . Weight loss 12/18/2017  . Vascular dementia (HCC) 12/18/2017  . Hearing loss 01/09/2016  . Routine general medical examination at a health care facility 12/31/2015  . Dysuria 09/29/2015  . Anxiety and depression 09/21/2014  . Pedal edema 06/22/2014  . Encounter for Medicare annual wellness exam 03/18/2014  . Retinal vein occlusion 03/18/2014  . B12 deficiency 03/15/2014  . Colon cancer screening 04/19/2013  . At risk for falling 12/02/2011  . Hyperparathyroidism (  HCC) 11/20/2011  . Back pain, thoracic 09/30/2011  . Scoliosis 09/30/2011  . Degenerative disc disease, lumbar 09/30/2011  . Renal insufficiency 05/27/2011  . Syncope 12/05/2010  . Cerebral artery occlusion with cerebral infarction (HCC) 02/26/2008  . Hyperlipidemia 05/12/2006  . Essential hypertension 05/12/2006  . DIVERTICULOSIS, COLON 05/12/2006  . Osteopenia 05/12/2006   Past Medical History:  Diagnosis Date  . Chronic pain   . DDD (degenerative disc disease)   . Diverticulosis   . Hyperlipidemia   . Hypertension   . Osteopenia   . Stroke (HCC)   . Syncope    Past Surgical History:  Procedure Laterality Date  . ABDOMINAL HYSTERECTOMY    . carotid doppler     normal  . DOPPLER  ECHOCARDIOGRAPHY     normal  . EXPLORATORY LAPAROTOMY     2 times both neg   . LAMINECTOMY  06-2003   l2-l4  . SPINE SURGERY     l3-l4 microdiscectomy   . TONSILLECTOMY     Social History   Tobacco Use  . Smoking status: Never Smoker  . Smokeless tobacco: Never Used  Substance Use Topics  . Alcohol use: No    Alcohol/week: 0.0 standard drinks  . Drug use: No   Family History  Problem Relation Age of Onset  . Hypertension Father   . COPD Father   . Other Mother        Deceased, 4394  . Healthy Brother    Allergies  Allergen Reactions  . Celecoxib Other (See Comments)    affects kidneys  . Codeine Nausea Only and Other (See Comments)    dizziness  . Lisinopril Cough  . Etodolac Rash   Current Outpatient Medications on File Prior to Visit  Medication Sig Dispense Refill  . acetaminophen (TYLENOL) 325 MG tablet Take 650 mg by mouth every 6 (six) hours as needed.    . Cholecalciferol (VITAMIN D PO) Take 1 tablet by mouth daily.    . cyanocobalamin 1000 MCG tablet Take 1,000 mcg by mouth daily.    . Ensure (ENSURE) Take 237 mLs by mouth 2 (two) times daily between meals.    Marland Kitchen. losartan (COZAAR) 100 MG tablet TAKE 1/2 TABLET (50 MG TOTAL) BY MOUTH DAILY. (Patient taking differently: Take 100 mg by mouth daily. ) 45 tablet 3  . sertraline (ZOLOFT) 50 MG tablet Take 1/2 tablet daily for 7 days then increase to 1 tablet by mouth daily 90 tablet 3  . atorvastatin (LIPITOR) 40 MG tablet Take 1 tablet (40 mg total) by mouth at bedtime. 90 tablet 3  . Brinzolamide-Brimonidine (SIMBRINZA) 1-0.2 % SUSP Place 1 drop into the right eye 3 (three) times daily.      No current facility-administered medications on file prior to visit.    Review of Systems  Constitutional: Positive for appetite change. Negative for activity change, fatigue, fever and unexpected weight change.  HENT: Negative for congestion, ear pain, rhinorrhea, sinus pressure and sore throat.   Eyes: Positive for visual  disturbance. Negative for pain and redness.  Respiratory: Negative for cough, shortness of breath and wheezing.   Cardiovascular: Negative for chest pain and palpitations.  Gastrointestinal: Negative for abdominal pain, blood in stool, constipation and diarrhea.  Endocrine: Negative for polydipsia and polyuria.  Genitourinary: Negative for dysuria, frequency and urgency.       Some urinary incontinence  Musculoskeletal: Negative for arthralgias, back pain and myalgias.  Skin: Negative for pallor and rash.  Allergic/Immunologic: Negative for environmental allergies.  Neurological: Negative for dizziness, syncope and headaches.       Poor balance No recent falls  Hematological: Negative for adenopathy. Does not bruise/bleed easily.  Psychiatric/Behavioral: Positive for confusion. Negative for decreased concentration, dysphoric mood and sleep disturbance. The patient is not nervous/anxious.        Objective:   Physical Exam Constitutional:      General: She is not in acute distress.    Appearance: Normal appearance. She is well-developed. She is not ill-appearing or diaphoretic.     Comments: Under weight  Frail appearing  HENT:     Head: Normocephalic and atraumatic.     Right Ear: External ear normal.     Left Ear: External ear normal.     Ears:     Comments: Hearing aides in place  This helps her hearing significantly    Nose: Nose normal. No congestion.     Mouth/Throat:     Mouth: Mucous membranes are moist.     Pharynx: Oropharynx is clear. No posterior oropharyngeal erythema.  Eyes:     General: No scleral icterus.    Extraocular Movements: Extraocular movements intact.     Conjunctiva/sclera: Conjunctivae normal.     Pupils: Pupils are equal, round, and reactive to light.  Neck:     Thyroid: No thyromegaly.     Vascular: No carotid bruit or JVD.  Cardiovascular:     Rate and Rhythm: Normal rate and regular rhythm.     Pulses: Normal pulses.     Heart sounds: Normal  heart sounds. No gallop.   Pulmonary:     Effort: Pulmonary effort is normal. No respiratory distress.     Breath sounds: Normal breath sounds. No wheezing.     Comments: Good air exch Chest:     Chest wall: No tenderness.  Abdominal:     General: Bowel sounds are normal. There is no distension or abdominal bruit.     Palpations: Abdomen is soft. There is no mass.     Tenderness: There is no abdominal tenderness.     Hernia: No hernia is present.  Genitourinary:    Comments: Breast exam: No mass, nodules, thickening, tenderness, bulging, retraction, inflamation, nipple discharge or skin changes noted.  No axillary or clavicular LA.     Musculoskeletal:        General: No tenderness. Normal range of motion.     Cervical back: Normal range of motion and neck supple. No rigidity. No muscular tenderness.     Right lower leg: No edema.     Left lower leg: No edema.     Comments: Kyphoscoliosis is significant  Lymphadenopathy:     Cervical: No cervical adenopathy.  Skin:    General: Skin is warm and dry.     Coloration: Skin is not pale.     Findings: No erythema or rash.     Comments: sks diffusely  Some solar aging  Neurological:     Mental Status: She is alert. Mental status is at baseline.     Cranial Nerves: No cranial nerve deficit.     Motor: No weakness or abnormal muscle tone.     Gait: Gait normal.     Deep Tendon Reflexes: Reflexes are normal and symmetric.     Comments: No focal weakness today  Able to walk with walker  Psychiatric:        Mood and Affect: Mood normal.        Behavior: Behavior normal.  Cognition and Memory: Cognition is impaired. Memory is impaired.     Comments: Pt is calm and content  Generally confused  Able to answer some questions            Assessment & Plan:   Problem List Items Addressed This Visit      Cardiovascular and Mediastinum   Essential hypertension    bp in fair control at this time  BP Readings from Last 1  Encounters:  05/20/19 130/72   No changes needed Most recent labs reviewed  Disc lifstyle change with low sodium diet and exercise        Relevant Orders   CBC with Differential/Platelet (Completed)   Comprehensive metabolic panel (Completed)   Lipid panel (Completed)   TSH (Completed)   Retinal vein occlusion    Blind in this eye now  Has adjust well  Also with dementia-moving into a new facility for memory care and will have help with ADLs        Endocrine   Hyperparathyroidism ([place]) (Chronic)    Last ca level was normal  No clinical changes  Labs today        Nervous and Auditory   Hearing loss    Wearing hearing aides  Has help to take them in/out and store them safely now      Vascular dementia [facility])    This is progressive  Short term memory loss and cognitive decline  Mood is ok  Has needed some help with ADLs  Paperwork filled out for admit to new memory care facility        Musculoskeletal and Integument   Osteopenia    Declines any further eval/tx at this time Taking vit D  Has had a cervical fx in the past  Uses walker to prevent falls         Other   Hyperlipidemia    In pt with h/o vascular dementia  Takes atorvastatin  Prev well controlled Disc goals for lipids and reasons to control them Rev last labs with pt Rev low sat fat diet in detail  New lipid profile drawn today      At risk for falling    Continues use of walker      B12 deficiency    Taking 1000 mcg B12 oral qd Level drawn today      Relevant Orders   Vitamin B12 (Completed)   Anxiety and depression    Back on zoloft and doing fairly well (in setting of vascular dementia)       Routine general medical examination at a health care facility - Primary    Reviewed health habits including diet and exercise and skin cancer prevention Reviewed appropriate screening tests for age  Also reviewed health mt list, fam hx and immunization status , as well as social and  family history   See HPI Labs ordered  Disc need for more protein calories for wt gain  Forms done for admit to new memory care unit Declines cancer screening or dexa due to age Had her covid vaccine series  Vascular dementia and hearing loss progress  No new falls in setting of close supervision with ADL assistance and walker         Weight loss    Low weight noted Eating less with dementia - hopefull she will do better in new memory care unit  Emphasized importance of protein calories and reminders to eat/drink      Screening-pulmonary TB  Needed for admit to memory care unit  TB gold test today      Relevant Orders   QuantiFERON-TB Gold Plus

## 2019-05-22 LAB — QUANTIFERON-TB GOLD PLUS
Mitogen-NIL: 7.6 IU/mL
NIL: 0.09 IU/mL
QuantiFERON-TB Gold Plus: NEGATIVE
TB1-NIL: <0 IU/mL
TB2-NIL: <0 IU/mL

## 2019-05-24 ENCOUNTER — Telehealth: Payer: Self-pay | Admitting: Family Medicine

## 2019-05-24 NOTE — Telephone Encounter (Signed)
[name] with [facility] in [place] calling for FL2 forms - this was faxed over on 05/13/19 They need this back this week -- the patient is moving in on Saturday 05/29/19  This can be faxed back to fax number listed in contact info

## 2019-05-24 NOTE — Telephone Encounter (Signed)
Tammy notified of lab results and Dr. Royden Purl comments and advised we faxed the forms to the new nursing home

## 2019-05-24 NOTE — Telephone Encounter (Signed)
Forms, FL2, and OV note all faxed to [facility].  Called niece Tammy to advise her and to also go over labs results and no answer so left VM requesting her to call the office back

## 2019-05-24 NOTE — Telephone Encounter (Signed)
Done and in IN box  Please send with copy of last note Thanks

## 2019-06-21 ENCOUNTER — Telehealth: Payer: Self-pay | Admitting: Family Medicine

## 2019-06-21 NOTE — Telephone Encounter (Signed)
[employer] and spoke with the med tech who sent the fax, she advised me that pt isn't wanting to do anything but sleep and stay in the bed all day. When staff is trying to help her get dress or help with any ADLs she is yelling at them and refusing any assistance. Pt only wants to stay in the bed and not do any activities. The med tech said she only works during the day so she can't really say if the agitation is during the afternoon or if she has any sundowning but the agitation and depression is all day when she is there. Pt isn't having any active hallucinations that they have seen but she does talk to "people" all day that are not there. No sxs of UTI or infections that they are aware of she is still eating and drinking well. Assistant living hasn't told family at all so I called them  Called Tammy (niece) and she wasn't aware of any of this, [facility] had not told her anything. I advised Tammy of what the med tech told her and she said she is going to call up there and speak with someone. She hasn't seen pt for a few weeks due to a covid case and [facility] was in quarantine. The quarantine is complete so Tammy also wants to go up there and see pt for herself. Tammy doesn't want anything called in until she checks on pt and will call us back if she decides that they want to proceed with med.  I will hold form until we hear back from Jens Som to PCP

## 2019-06-21 NOTE — Telephone Encounter (Signed)
Thanks! I will wait for follow up info

## 2019-06-21 NOTE — Telephone Encounter (Signed)
I rec a note from [facility] indicating that pt is agitated  Often stays in bed/refuses assistance   Please ask am/pm or both? Does she sundown? Have they discussed this with her family?  Any hallucinations/delusions  Any s/s of uti or other infection   Medications for agitation can be risky (falls/sedatoin/mortality) so I need family ok to px anything   thanks

## 2019-06-22 ENCOUNTER — Encounter: Payer: Self-pay | Admitting: Family Medicine

## 2019-07-13 ENCOUNTER — Encounter: Payer: Self-pay | Admitting: Family Medicine

## 2019-07-19 MED ORDER — SERTRALINE HCL 100 MG PO TABS: 100.0000 mg | ORAL_TABLET | Freq: Every day | ORAL | 3 refills | Status: DC

## 2019-07-19 NOTE — Telephone Encounter (Signed)
Spoke with Med Tech at [facility], they request [place] to send the Rx and order to them and they will get it filled. Rx faxed then sent mychart message letting family know Rx was sent

## 2019-07-19 NOTE — Telephone Encounter (Signed)
I printed a px (for the order)- is in the IN box Please send this to [facility] with note that we want to increase dose of zoloft to 100 mg daily  Thanks

## 2019-08-19 ENCOUNTER — Emergency Department
Admission: EM | Admit: 2019-08-19 | Discharge: 2019-08-19 | Disposition: A | Discharge status: Home / Self Care | Payer: Medicare Other | Attending: Emergency Medicine | Admitting: Emergency Medicine

## 2019-08-19 ENCOUNTER — Other Ambulatory Visit: Payer: Self-pay

## 2019-08-19 ENCOUNTER — Emergency Department: Payer: Medicare Other

## 2019-08-19 DIAGNOSIS — Y92129 Unspecified place in nursing home as the place of occurrence of the external cause: Secondary | ICD-10-CM | POA: Insufficient documentation

## 2019-08-19 DIAGNOSIS — S0003XA Contusion of scalp, initial encounter: Secondary | ICD-10-CM | POA: Diagnosis not present

## 2019-08-19 DIAGNOSIS — R0781 Pleurodynia: Secondary | ICD-10-CM | POA: Diagnosis not present

## 2019-08-19 DIAGNOSIS — Y999 Unspecified external cause status: Secondary | ICD-10-CM | POA: Insufficient documentation

## 2019-08-19 DIAGNOSIS — Y939 Activity, unspecified: Secondary | ICD-10-CM | POA: Diagnosis not present

## 2019-08-19 DIAGNOSIS — R52 Pain, unspecified: Secondary | ICD-10-CM | POA: Diagnosis not present

## 2019-08-19 DIAGNOSIS — S0990XA Unspecified injury of head, initial encounter: Secondary | ICD-10-CM | POA: Insufficient documentation

## 2019-08-19 DIAGNOSIS — I10 Essential (primary) hypertension: Secondary | ICD-10-CM | POA: Insufficient documentation

## 2019-08-19 DIAGNOSIS — W19XXXA Unspecified fall, initial encounter: Secondary | ICD-10-CM | POA: Diagnosis not present

## 2019-08-19 DIAGNOSIS — S3993XA Unspecified injury of pelvis, initial encounter: Secondary | ICD-10-CM | POA: Diagnosis not present

## 2019-08-19 DIAGNOSIS — G319 Degenerative disease of nervous system, unspecified: Secondary | ICD-10-CM | POA: Diagnosis not present

## 2019-08-19 DIAGNOSIS — M47816 Spondylosis without myelopathy or radiculopathy, lumbar region: Secondary | ICD-10-CM | POA: Diagnosis not present

## 2019-08-19 LAB — COMPREHENSIVE METABOLIC PANEL
ALT: 27 U/L (ref 0–44)
AST: 28 U/L (ref 15–41)
Albumin: 3.6 g/dL (ref 3.5–5.0)
Alkaline Phosphatase: 112 U/L (ref 38–126)
Anion gap: 8 (ref 5–15)
BUN: 16 mg/dL (ref 8–23)
CO2: 27 mmol/L (ref 22–32)
Calcium: 9.7 mg/dL (ref 8.9–10.3)
Chloride: 104 mmol/L (ref 98–111)
Creatinine, Ser: 0.86 mg/dL (ref 0.44–1.00)
GFR calc Af Amer: >60 mL/min (ref >60)
GFR calc non Af Amer: >60 mL/min (ref >60)
Glucose, Bld: 102 mg/dL — ABNORMAL HIGH (ref 70–99)
Potassium: 4 mmol/L (ref 3.5–5.1)
Sodium: 139 mmol/L (ref 135–145)
Total Bilirubin: 0.7 mg/dL (ref 0.3–1.2)
Total Protein: 6.5 g/dL (ref 6.5–8.1)

## 2019-08-19 LAB — CBC WITH DIFFERENTIAL/PLATELET
Abs Immature Granulocytes: 0.03 10*3/uL (ref 0.00–0.07)
Basophils Absolute: 0 10*3/uL (ref 0.0–0.1)
Basophils Relative: 0 %
Eosinophils Absolute: 0.2 10*3/uL (ref 0.0–0.5)
Eosinophils Relative: 3 %
HCT: 36.5 % (ref 36.0–46.0)
Hemoglobin: 12.2 g/dL (ref 12.0–15.0)
Immature Granulocytes: 0 %
Lymphocytes Relative: 21 %
Lymphs Abs: 1.7 10*3/uL (ref 0.7–4.0)
MCH: 33.5 pg (ref 26.0–34.0)
MCHC: 33.4 g/dL (ref 30.0–36.0)
MCV: 100.3 fL — ABNORMAL HIGH (ref 80.0–100.0)
Monocytes Absolute: 0.9 10*3/uL (ref 0.1–1.0)
Monocytes Relative: 11 %
Neutro Abs: 5.3 10*3/uL (ref 1.7–7.7)
Neutrophils Relative %: 65 %
Platelets: 143 10*3/uL — ABNORMAL LOW (ref 150–400)
RBC: 3.64 MIL/uL — ABNORMAL LOW (ref 3.87–5.11)
RDW: 12.8 % (ref 11.5–15.5)
WBC: 8.2 10*3/uL (ref 4.0–10.5)
nRBC: 0 % (ref 0.0–0.2)

## 2019-08-19 LAB — TROPONIN I (HIGH SENSITIVITY): Troponin I (High Sensitivity): 15 ng/L (ref <18)

## 2019-08-19 NOTE — ED Provider Notes (Signed)
ER Provider Note       Time seen: 9:19 PM  Level V caveat: History/ROS limited by altered mental status  I have reviewed the vital signs and the nursing notes.  HISTORY   Chief Complaint Fall   HPI [name] is a 86 y.o. female with a history of chronic pain, hyperlipidemia, hypertension, CVA, syncope who presents today for unwitnessed fall at [facility] memory care.  Patient was found with blankets wrapped around her ankles and was believed to have fallen when she attempted to get out of bed.  Unknown loss of consciousness, visualize hematoma to the left side of her head also left rib pain.  She is alert but disoriented.  Past Medical History:  Diagnosis Date  . Chronic pain   . DDD (degenerative disc disease)   . Diverticulosis   . Hyperlipidemia   . Hypertension   . Osteopenia   . Stroke (HCC)   . Syncope     Past Surgical History:  Procedure Laterality Date  . ABDOMINAL HYSTERECTOMY    . carotid doppler     normal  . DOPPLER ECHOCARDIOGRAPHY     normal  . EXPLORATORY LAPAROTOMY     2 times both neg   . LAMINECTOMY  06-2003   l2-l4  . SPINE SURGERY     l3-l4 microdiscectomy   . TONSILLECTOMY      Allergies Celecoxib, Codeine, Lisinopril, and Etodolac  Review of Systems Unknown, patient disoriented  All systems negative/normal/unremarkable except as stated in the HPI  ____________________________________________   PHYSICAL EXAM:  VITAL SIGNS: There were no vitals filed for this visit.  Constitutional: Alert but disoriented, no acute distress Eyes: Conjunctivae are normal. Normal extraocular movements. ENT      Head: Normocephalic, left temporal hematoma is noted      Nose: No congestion/rhinnorhea.      Mouth/Throat: Mucous membranes are moist.      Neck: No stridor. Cardiovascular: Normal rate, regular rhythm. No murmurs, rubs, or gallops. Respiratory: Normal respiratory effort without tachypnea nor retractions. Breath sounds are clear and  equal bilaterally. No wheezes/rales/rhonchi. Gastrointestinal: Soft and nontender. Normal bowel sounds Musculoskeletal: Limited but unremarkable range of motion of the extremities, left-sided rib tenderness is noted Neurologic:   No gross focal neurologic deficits are appreciated.  Skin:  Skin is warm, dry and intact.  Left temporal scalp hematoma Psychiatric: Speech is normal ____________________________________________  EKG: Interpreted by me.  Sinus rhythm with rate of 58 bpm, low voltage, nonspecific ST segment changes, normal QT  ____________________________________________   LABS (pertinent positives/negatives)  Labs Reviewed  CBC WITH DIFFERENTIAL/PLATELET - Abnormal; Notable for the following components:      Result Value   RBC 3.64 (*)    MCV 100.3 (*)    Platelets 143 (*)    All other components within normal limits  COMPREHENSIVE METABOLIC PANEL - Abnormal; Notable for the following components:   Glucose, Bld 102 (*)    All other components within normal limits  URINALYSIS, COMPLETE (UACMP) WITH MICROSCOPIC  CBG MONITORING, ED  TROPONIN I (HIGH SENSITIVITY)    RADIOLOGY  Images were viewed by me CT head, chest x-ray with rib views, pelvis x-ray Imaging was unremarkable  DIFFERENTIAL DIAGNOSIS  Fall, contusion, fracture, subdural, subarachnoid, MI, occult infection  ASSESSMENT AND PLAN  Fall, minor head injury   Plan: The patient had presented for unwitnessed fall with head injury. Patient's labs were reassuring, patient unable to provide a urine specimen but states it does not burn  when she urinates.  She is cleared for outpatient follow-up.  Daryel November MD    Note: This note was generated in part or whole with voice recognition software. Voice recognition is usually quite accurate but there are transcription errors that can and very often do occur. I apologize for any typographical errors that were not detected and corrected.     Emily Filbert, MD 08/19/19 2255

## 2019-08-19 NOTE — ED Notes (Signed)
This Rn spoke with [place] Med Tech from [facility] memory care, who st pt "was making her be when she fell" it was "an unwitnessed fall".  Pt's point of contact when DC is Arliss Journey [phone].  Please refer to documents on chart for available POC's.

## 2019-08-19 NOTE — ED Notes (Signed)
Patient discharged to [facility] per MD order. Patient in stable condition, and deemed medically cleared by ED provider for discharge. Discharge instructions reviewed with patient/family using "Teach Back"; verbalized understanding of medication education and administration, and information about follow-up care. Denies further concerns.

## 2019-08-19 NOTE — ED Notes (Signed)
Attempted to call [facility] for report without success, will attempt again.

## 2019-08-19 NOTE — ED Notes (Signed)
[name] [facility]) will be here to pick her up in about 45 min. Report called to [facility] facility.

## 2019-08-19 NOTE — ED Triage Notes (Signed)
Pt arrives to ED via [facility] from [facility] s/p unwitnessed fall. EMS states pt was found with blankets around her ankles and believed to have fallen when attempting to get out of bed. EMS reports unknown LOC; hematoma to left side of head without laceration or bleeding. Pt's only pain c/o is left rib pain. Pt is alert, but unable to answer orientation questions. RR is even, regular, and unlabored.

## 2019-08-20 ENCOUNTER — Telehealth: Payer: Self-pay | Admitting: Family Medicine

## 2019-08-20 NOTE — Telephone Encounter (Signed)
[facility] Called in regards to order they received for patient tylenol  They stated that they can not do a sliding scale.  They need the order to state every 4 or 6 hours.   Fax- [phone]

## 2019-08-20 NOTE — Telephone Encounter (Signed)
Tell them every 6 hours Thanks

## 2019-08-20 NOTE — Telephone Encounter (Signed)
It's an order form that they faxed [place] that has to be completed before they can admin meds. Form in your inbox to complete on Monday

## 2019-08-23 NOTE — Telephone Encounter (Signed)
Done and in IN box 

## 2019-08-23 NOTE — Telephone Encounter (Signed)
Form faxed

## 2019-09-21 DIAGNOSIS — E559 Vitamin D deficiency, unspecified: Secondary | ICD-10-CM | POA: Diagnosis not present

## 2019-09-21 DIAGNOSIS — E782 Mixed hyperlipidemia: Secondary | ICD-10-CM | POA: Diagnosis not present

## 2019-09-21 DIAGNOSIS — Z79899 Other long term (current) drug therapy: Secondary | ICD-10-CM | POA: Diagnosis not present

## 2019-09-21 DIAGNOSIS — F0150 Vascular dementia without behavioral disturbance: Secondary | ICD-10-CM | POA: Diagnosis not present

## 2019-09-23 DIAGNOSIS — E559 Vitamin D deficiency, unspecified: Secondary | ICD-10-CM | POA: Diagnosis not present

## 2019-09-23 DIAGNOSIS — E782 Mixed hyperlipidemia: Secondary | ICD-10-CM | POA: Diagnosis not present

## 2019-09-23 DIAGNOSIS — F0150 Vascular dementia without behavioral disturbance: Secondary | ICD-10-CM | POA: Diagnosis not present

## 2019-10-04 DIAGNOSIS — I739 Peripheral vascular disease, unspecified: Secondary | ICD-10-CM | POA: Diagnosis not present

## 2019-10-04 DIAGNOSIS — L84 Corns and callosities: Secondary | ICD-10-CM | POA: Diagnosis not present

## 2019-10-04 DIAGNOSIS — B351 Tinea unguium: Secondary | ICD-10-CM | POA: Diagnosis not present

## 2019-10-06 DIAGNOSIS — Z79899 Other long term (current) drug therapy: Secondary | ICD-10-CM | POA: Diagnosis not present

## 2019-10-12 DIAGNOSIS — R748 Abnormal levels of other serum enzymes: Secondary | ICD-10-CM | POA: Diagnosis not present

## 2019-10-12 DIAGNOSIS — E538 Deficiency of other specified B group vitamins: Secondary | ICD-10-CM | POA: Diagnosis not present

## 2019-10-12 DIAGNOSIS — I10 Essential (primary) hypertension: Secondary | ICD-10-CM | POA: Diagnosis not present

## 2019-10-12 DIAGNOSIS — R635 Abnormal weight gain: Secondary | ICD-10-CM | POA: Diagnosis not present

## 2019-11-09 DIAGNOSIS — R600 Localized edema: Secondary | ICD-10-CM | POA: Diagnosis not present

## 2019-11-09 DIAGNOSIS — F329 Major depressive disorder, single episode, unspecified: Secondary | ICD-10-CM | POA: Diagnosis not present

## 2019-11-09 DIAGNOSIS — R2681 Unsteadiness on feet: Secondary | ICD-10-CM | POA: Diagnosis not present

## 2019-11-17 DIAGNOSIS — D7589 Other specified diseases of blood and blood-forming organs: Secondary | ICD-10-CM | POA: Diagnosis not present

## 2019-12-14 DIAGNOSIS — F0150 Vascular dementia without behavioral disturbance: Secondary | ICD-10-CM | POA: Diagnosis not present

## 2019-12-14 DIAGNOSIS — E559 Vitamin D deficiency, unspecified: Secondary | ICD-10-CM | POA: Diagnosis not present

## 2019-12-14 DIAGNOSIS — E7849 Other hyperlipidemia: Secondary | ICD-10-CM | POA: Diagnosis not present

## 2020-01-04 DIAGNOSIS — F419 Anxiety disorder, unspecified: Secondary | ICD-10-CM | POA: Diagnosis not present

## 2020-01-04 DIAGNOSIS — I10 Essential (primary) hypertension: Secondary | ICD-10-CM | POA: Diagnosis not present

## 2020-01-04 DIAGNOSIS — R634 Abnormal weight loss: Secondary | ICD-10-CM | POA: Diagnosis not present

## 2020-01-24 DIAGNOSIS — E538 Deficiency of other specified B group vitamins: Secondary | ICD-10-CM | POA: Diagnosis not present

## 2020-01-25 DIAGNOSIS — F329 Major depressive disorder, single episode, unspecified: Secondary | ICD-10-CM | POA: Diagnosis not present

## 2020-01-25 DIAGNOSIS — Z79899 Other long term (current) drug therapy: Secondary | ICD-10-CM | POA: Diagnosis not present

## 2020-01-25 DIAGNOSIS — G8929 Other chronic pain: Secondary | ICD-10-CM | POA: Diagnosis not present

## 2020-01-25 DIAGNOSIS — I10 Essential (primary) hypertension: Secondary | ICD-10-CM | POA: Diagnosis not present

## 2020-01-28 DIAGNOSIS — Z79899 Other long term (current) drug therapy: Secondary | ICD-10-CM | POA: Diagnosis not present

## 2020-02-01 DIAGNOSIS — D7589 Other specified diseases of blood and blood-forming organs: Secondary | ICD-10-CM | POA: Diagnosis not present

## 2020-02-01 DIAGNOSIS — R739 Hyperglycemia, unspecified: Secondary | ICD-10-CM | POA: Diagnosis not present

## 2020-02-01 DIAGNOSIS — I10 Essential (primary) hypertension: Secondary | ICD-10-CM | POA: Diagnosis not present

## 2020-02-09 DIAGNOSIS — U071 COVID-19: Secondary | ICD-10-CM | POA: Diagnosis not present

## 2020-02-15 DIAGNOSIS — E559 Vitamin D deficiency, unspecified: Secondary | ICD-10-CM | POA: Diagnosis not present

## 2020-02-15 DIAGNOSIS — E7849 Other hyperlipidemia: Secondary | ICD-10-CM | POA: Diagnosis not present

## 2020-02-15 DIAGNOSIS — U071 COVID-19: Secondary | ICD-10-CM | POA: Diagnosis not present

## 2020-03-21 DIAGNOSIS — Z20828 Contact with and (suspected) exposure to other viral communicable diseases: Secondary | ICD-10-CM | POA: Diagnosis not present

## 2020-03-21 DIAGNOSIS — F329 Major depressive disorder, single episode, unspecified: Secondary | ICD-10-CM | POA: Diagnosis not present

## 2020-03-21 DIAGNOSIS — F0150 Vascular dementia without behavioral disturbance: Secondary | ICD-10-CM | POA: Diagnosis not present

## 2020-03-21 DIAGNOSIS — I10 Essential (primary) hypertension: Secondary | ICD-10-CM | POA: Diagnosis not present

## 2020-03-29 DIAGNOSIS — Z20828 Contact with and (suspected) exposure to other viral communicable diseases: Secondary | ICD-10-CM | POA: Diagnosis not present

## 2020-04-17 DIAGNOSIS — F0150 Vascular dementia without behavioral disturbance: Secondary | ICD-10-CM | POA: Diagnosis not present

## 2020-04-17 DIAGNOSIS — F0631 Mood disorder due to known physiological condition with depressive features: Secondary | ICD-10-CM | POA: Diagnosis not present

## 2020-04-27 DIAGNOSIS — R739 Hyperglycemia, unspecified: Secondary | ICD-10-CM | POA: Diagnosis not present

## 2020-05-09 DIAGNOSIS — R2681 Unsteadiness on feet: Secondary | ICD-10-CM | POA: Diagnosis not present

## 2020-05-09 DIAGNOSIS — R739 Hyperglycemia, unspecified: Secondary | ICD-10-CM | POA: Diagnosis not present

## 2020-05-09 DIAGNOSIS — F419 Anxiety disorder, unspecified: Secondary | ICD-10-CM | POA: Diagnosis not present

## 2020-05-09 DIAGNOSIS — W19XXXA Unspecified fall, initial encounter: Secondary | ICD-10-CM | POA: Diagnosis not present

## 2020-05-16 DIAGNOSIS — F0150 Vascular dementia without behavioral disturbance: Secondary | ICD-10-CM | POA: Diagnosis not present

## 2020-05-16 DIAGNOSIS — I10 Essential (primary) hypertension: Secondary | ICD-10-CM | POA: Diagnosis not present

## 2020-05-16 DIAGNOSIS — E7849 Other hyperlipidemia: Secondary | ICD-10-CM | POA: Diagnosis not present

## 2020-05-24 DIAGNOSIS — E213 Hyperparathyroidism, unspecified: Secondary | ICD-10-CM | POA: Diagnosis not present

## 2020-05-24 DIAGNOSIS — F419 Anxiety disorder, unspecified: Secondary | ICD-10-CM | POA: Diagnosis not present

## 2020-05-24 DIAGNOSIS — I10 Essential (primary) hypertension: Secondary | ICD-10-CM | POA: Diagnosis not present

## 2020-05-24 DIAGNOSIS — M5136 Other intervertebral disc degeneration, lumbar region: Secondary | ICD-10-CM | POA: Diagnosis not present

## 2020-05-24 DIAGNOSIS — F329 Major depressive disorder, single episode, unspecified: Secondary | ICD-10-CM | POA: Diagnosis not present

## 2020-05-24 DIAGNOSIS — M419 Scoliosis, unspecified: Secondary | ICD-10-CM | POA: Diagnosis not present

## 2020-05-24 DIAGNOSIS — H9190 Unspecified hearing loss, unspecified ear: Secondary | ICD-10-CM | POA: Diagnosis not present

## 2020-05-24 DIAGNOSIS — Z9181 History of falling: Secondary | ICD-10-CM | POA: Diagnosis not present

## 2020-05-24 DIAGNOSIS — G8929 Other chronic pain: Secondary | ICD-10-CM | POA: Diagnosis not present

## 2020-05-24 DIAGNOSIS — I69318 Other symptoms and signs involving cognitive functions following cerebral infarction: Secondary | ICD-10-CM | POA: Diagnosis not present

## 2020-05-24 DIAGNOSIS — M8580 Other specified disorders of bone density and structure, unspecified site: Secondary | ICD-10-CM | POA: Diagnosis not present

## 2020-05-24 DIAGNOSIS — K5790 Diverticulosis of intestine, part unspecified, without perforation or abscess without bleeding: Secondary | ICD-10-CM | POA: Diagnosis not present

## 2020-05-24 DIAGNOSIS — E559 Vitamin D deficiency, unspecified: Secondary | ICD-10-CM | POA: Diagnosis not present

## 2020-05-24 DIAGNOSIS — E785 Hyperlipidemia, unspecified: Secondary | ICD-10-CM | POA: Diagnosis not present

## 2020-05-24 DIAGNOSIS — E538 Deficiency of other specified B group vitamins: Secondary | ICD-10-CM | POA: Diagnosis not present

## 2020-05-24 DIAGNOSIS — F0150 Vascular dementia without behavioral disturbance: Secondary | ICD-10-CM | POA: Diagnosis not present

## 2020-06-06 DIAGNOSIS — F329 Major depressive disorder, single episode, unspecified: Secondary | ICD-10-CM | POA: Diagnosis not present

## 2020-06-06 DIAGNOSIS — I10 Essential (primary) hypertension: Secondary | ICD-10-CM | POA: Diagnosis not present

## 2020-06-06 DIAGNOSIS — E559 Vitamin D deficiency, unspecified: Secondary | ICD-10-CM | POA: Diagnosis not present

## 2020-06-08 DIAGNOSIS — F0150 Vascular dementia without behavioral disturbance: Secondary | ICD-10-CM | POA: Diagnosis not present

## 2020-06-08 DIAGNOSIS — I69318 Other symptoms and signs involving cognitive functions following cerebral infarction: Secondary | ICD-10-CM | POA: Diagnosis not present

## 2020-06-08 DIAGNOSIS — I10 Essential (primary) hypertension: Secondary | ICD-10-CM | POA: Diagnosis not present

## 2020-07-19 ENCOUNTER — Emergency Department: Payer: Medicare Other

## 2020-07-19 ENCOUNTER — Encounter: Payer: Self-pay | Admitting: Emergency Medicine

## 2020-07-19 ENCOUNTER — Other Ambulatory Visit: Payer: Self-pay

## 2020-07-19 DIAGNOSIS — F0390 Unspecified dementia without behavioral disturbance: Secondary | ICD-10-CM | POA: Insufficient documentation

## 2020-07-19 DIAGNOSIS — Y92838 Other recreation area as the place of occurrence of the external cause: Secondary | ICD-10-CM | POA: Diagnosis not present

## 2020-07-19 DIAGNOSIS — Z79899 Other long term (current) drug therapy: Secondary | ICD-10-CM | POA: Diagnosis not present

## 2020-07-19 DIAGNOSIS — I10 Essential (primary) hypertension: Secondary | ICD-10-CM | POA: Insufficient documentation

## 2020-07-19 DIAGNOSIS — N179 Acute kidney failure, unspecified: Secondary | ICD-10-CM | POA: Insufficient documentation

## 2020-07-19 DIAGNOSIS — W01198A Fall on same level from slipping, tripping and stumbling with subsequent striking against other object, initial encounter: Secondary | ICD-10-CM | POA: Insufficient documentation

## 2020-07-19 DIAGNOSIS — N390 Urinary tract infection, site not specified: Secondary | ICD-10-CM | POA: Diagnosis not present

## 2020-07-19 DIAGNOSIS — Z043 Encounter for examination and observation following other accident: Secondary | ICD-10-CM | POA: Insufficient documentation

## 2020-07-19 DIAGNOSIS — S13120A Subluxation of C1/C2 cervical vertebrae, initial encounter: Secondary | ICD-10-CM | POA: Diagnosis not present

## 2020-07-19 DIAGNOSIS — S0990XA Unspecified injury of head, initial encounter: Secondary | ICD-10-CM | POA: Diagnosis not present

## 2020-07-19 LAB — CBC
HCT: 35.5 % — ABNORMAL LOW (ref 36.0–46.0)
Hemoglobin: 12 g/dL (ref 12.0–15.0)
MCH: 33.6 pg (ref 26.0–34.0)
MCHC: 33.8 g/dL (ref 30.0–36.0)
MCV: 99.4 fL (ref 80.0–100.0)
Platelets: 133 10*3/uL — ABNORMAL LOW (ref 150–400)
RBC: 3.57 MIL/uL — ABNORMAL LOW (ref 3.87–5.11)
RDW: 13.2 % (ref 11.5–15.5)
WBC: 6.5 10*3/uL (ref 4.0–10.5)
nRBC: 0 % (ref 0.0–0.2)

## 2020-07-19 LAB — BASIC METABOLIC PANEL
Anion gap: 8 (ref 5–15)
BUN: 18 mg/dL (ref 8–23)
CO2: 26 mmol/L (ref 22–32)
Calcium: 10.4 mg/dL — ABNORMAL HIGH (ref 8.9–10.3)
Chloride: 103 mmol/L (ref 98–111)
Creatinine, Ser: 1.04 mg/dL — ABNORMAL HIGH (ref 0.44–1.00)
GFR, Estimated: 52 mL/min — ABNORMAL LOW (ref >60)
Glucose, Bld: 103 mg/dL — ABNORMAL HIGH (ref 70–99)
Potassium: 3.9 mmol/L (ref 3.5–5.1)
Sodium: 137 mmol/L (ref 135–145)

## 2020-07-19 LAB — TROPONIN I (HIGH SENSITIVITY): Troponin I (High Sensitivity): 10 ng/L (ref <18)

## 2020-07-19 NOTE — ED Triage Notes (Signed)
Pt arrived via POV with niece with reports of fall at [facility] around 930pm. Pt states she got dizzy and fell hitting head on recliner or on the floor.    Fall was unwitnessed.  Pt is hard of hearing, pt has no c/o pain at this time.  Niece states when she arrived at [facility] the patient was in bed asleep.

## 2020-07-20 ENCOUNTER — Emergency Department
Admission: EM | Admit: 2020-07-20 | Discharge: 2020-07-20 | Disposition: A | Discharge status: Home / Self Care | Payer: Medicare Other | Attending: Emergency Medicine | Admitting: Emergency Medicine

## 2020-07-20 DIAGNOSIS — W19XXXA Unspecified fall, initial encounter: Secondary | ICD-10-CM

## 2020-07-20 DIAGNOSIS — N179 Acute kidney failure, unspecified: Secondary | ICD-10-CM

## 2020-07-20 DIAGNOSIS — N390 Urinary tract infection, site not specified: Secondary | ICD-10-CM

## 2020-07-20 HISTORY — DX: Vascular dementia, unspecified severity, without behavioral disturbance, psychotic disturbance, mood disturbance, and anxiety: F01.50

## 2020-07-20 LAB — URINALYSIS, COMPLETE (UACMP) WITH MICROSCOPIC
Bacteria, UA: NONE SEEN
Bilirubin Urine: NEGATIVE
Glucose, UA: NEGATIVE mg/dL
Hgb urine dipstick: NEGATIVE
Ketones, ur: 5 mg/dL — AB
Nitrite: NEGATIVE
Protein, ur: NEGATIVE mg/dL
Specific Gravity, Urine: 1.017 (ref 1.005–1.030)
pH: 6 (ref 5.0–8.0)

## 2020-07-20 MED ORDER — CEPHALEXIN 250 MG PO CAPS
250.0000 mg | ORAL_CAPSULE | Freq: Once | ORAL | Status: AC
  Administered 2020-07-20: 250 mg via ORAL
  Filled 2020-07-20: qty 1

## 2020-07-20 MED ORDER — CEPHALEXIN 250 MG PO CAPS: 250.0000 mg | ORAL_CAPSULE | Freq: Three times a day (TID) | ORAL | 0 refills | Status: DC

## 2020-07-20 NOTE — ED Provider Notes (Signed)
[facility] Emergency Department Provider Note   ____________________________________________   Event Date/Time   First MD Initiated Contact with Patient 07/20/20 0019     (approximate)  I have reviewed the triage vital signs and the nursing notes.   HISTORY  Chief Complaint Fall  Level of V caveat: Limited by dementia History obtained via patient's niece   HPI [name] is a 87 y.o. female who presents to the ED from [facility] [place] status post fall.  Patient told her knee she got dizzy and fell, striking her head on the recliner with the floor.  This was unwitnessed fall.  Niece states patient was sleeping when she arrived at [facility].  Niece is unsure whether or not patient was truly dizzy as she has dementia.  Patient currently voices no complaints.  Denies pain.  Denies fever, cough, chest pain, shortness of breath, abdominal pain, nausea, vomiting or dizziness.  Denies anticoagulant use.     Past Medical History:  Diagnosis Date   Chronic pain    DDD (degenerative disc disease)    Diverticulosis    Hyperlipidemia    Hypertension    Osteopenia    Stroke [facility])    Syncope    Vascular dementia [facility])     Patient Active Problem List   Diagnosis Date Noted   Screening-pulmonary TB 05/20/2019   Cervical spine fracture (HCC) 12/18/2017   Weight loss 12/18/2017   Vascular dementia (HCC) 12/18/2017   Hearing loss 01/09/2016   Routine general medical examination at a health care facility 12/31/2015   Dysuria 09/29/2015   Anxiety and depression 09/21/2014   Pedal edema 06/22/2014   Encounter for Medicare annual wellness exam 03/18/2014   Retinal vein occlusion 03/18/2014   B12 deficiency 03/15/2014   Colon cancer screening 04/19/2013   At risk for falling 12/02/2011   Hyperparathyroidism (HCC) 11/20/2011   Back pain, thoracic 09/30/2011   Scoliosis 09/30/2011   Degenerative disc disease, lumbar 09/30/2011   Renal insufficiency  05/27/2011   Syncope 12/05/2010   Cerebral artery occlusion with cerebral infarction (HCC) 02/26/2008   Hyperlipidemia 05/12/2006   Essential hypertension 05/12/2006   DIVERTICULOSIS, COLON 05/12/2006   Osteopenia 05/12/2006    Past Surgical History:  Procedure Laterality Date   ABDOMINAL HYSTERECTOMY     carotid doppler     normal   DOPPLER ECHOCARDIOGRAPHY     normal   EXPLORATORY LAPAROTOMY     2 times both neg    LAMINECTOMY  06-2003   l2-l4   SPINE SURGERY     l3-l4 microdiscectomy    TONSILLECTOMY      Prior to Admission medications   Medication Sig Start Date End Date Taking? Authorizing Provider  cephALEXin (KEFLEX) 250 MG capsule Take 1 capsule (250 mg total) by mouth 3 (three) times daily. 07/20/20  Yes Irean Hong, MD  acetaminophen (TYLENOL) 325 MG tablet Take 650 mg by mouth every 6 (six) hours as needed.    [provider]  atorvastatin (LIPITOR) 40 MG tablet Take 1 tablet (40 mg total) by mouth at bedtime. 02/25/17 01/07/19  Tower, Audrie Gallus, MD  Brinzolamide-Brimonidine [facility]) 1-0.2 % SUSP Place 1 drop into the right eye 3 (three) times daily.     [provider]  Cholecalciferol (VITAMIN D PO) Take 1 tablet by mouth daily.    [provider]  cyanocobalamin 1000 MCG tablet Take 1,000 mcg by mouth daily.    [provider]  Ensure (ENSURE) Take 237  mLs by mouth 2 (two) times daily between meals.    [provider]  losartan (COZAAR) 100 MG tablet TAKE 1/2 TABLET (50 MG TOTAL) BY MOUTH DAILY. Patient taking differently: Take 100 mg by mouth daily.  02/25/17   Tower, Audrie GallusMarne A, MD  sertraline (ZOLOFT) 100 MG tablet Take 1 tablet (100 mg total) by mouth daily. 07/19/19   Tower, Audrie GallusMarne A, MD    Allergies Celecoxib, Codeine, Lisinopril, and Etodolac  Family History  Problem Relation Age of Onset   Hypertension Father    COPD Father    Other Mother        Deceased, 9394   Healthy Brother     Social History Social  History   Tobacco Use   Smoking status: Never   Smokeless tobacco: Never  Vaping Use   Vaping Use: Never used  Substance Use Topics   Alcohol use: No    Alcohol/week: 0.0 standard drinks   Drug use: No    Review of Systems  Constitutional: Positive for fall.  No fever/chills Eyes: No visual changes. ENT: No sore throat. Cardiovascular: Denies chest pain. Respiratory: Denies shortness of breath. Gastrointestinal: No abdominal pain.  No nausea, no vomiting.  No diarrhea.  No constipation. Genitourinary: Negative for dysuria. Musculoskeletal: Negative for back pain. Skin: Negative for rash. Neurological: Positive for dizziness.  Negative for headaches, focal weakness or numbness.   ____________________________________________   PHYSICAL EXAM:  VITAL SIGNS: ED Triage Vitals  Enc Vitals Group     BP 07/19/20 2233 (!) 146/115     Pulse Rate 07/19/20 2233 80     Resp 07/19/20 2233 18     Temp 07/19/20 2233 98 F (36.7 C)     Temp Source 07/19/20 2233 Oral     SpO2 07/19/20 2233 97 %     Weight 07/19/20 2237 120 lb (54.4 kg)     Height 07/19/20 2237 5\' 3"  (1.6 m)     Head Circumference --      Peak Flow --      Pain Score 07/19/20 2234 0     Pain Loc --      Pain Edu? --      Excl. in GC? --     Constitutional: Alert and oriented.  Elderly appearing and in no acute distress. Eyes: Conjunctivae are normal. PERRL. EOMI. Head: Atraumatic. Nose: Atraumatic. Mouth/Throat: Mucous membranes are mildly dry.  No dental malocclusion. Neck: No stridor.   Cardiovascular: Normal rate, regular rhythm. Grossly normal heart sounds.  Good peripheral circulation. Respiratory: Normal respiratory effort.  No retractions. Lungs CTAB. Gastrointestinal: Soft and nontender to light or deep palpation. No distention. No abdominal bruits. No CVA tenderness. Musculoskeletal: No spinal tenderness to palpation.  Pelvis is stable.  No lower extremity tenderness nor edema.  No joint  effusions. Neurologic: Alert and oriented to person which is baseline per patient's niece.  Normal speech and language. No gross focal neurologic deficits are appreciated. MAEx4. Skin:  Skin is warm, dry and intact. No rash noted. Psychiatric: Mood and affect are normal. Speech and behavior are normal.  ____________________________________________   LABS (all labs ordered are listed, but only abnormal results are displayed)  Labs Reviewed  BASIC METABOLIC PANEL - Abnormal; Notable for the following components:      Result Value   Glucose, Bld 103 (*)    Creatinine, Ser 1.04 (*)    Calcium 10.4 (*)    GFR, Estimated 52 (*)    All other components within  normal limits  CBC - Abnormal; Notable for the following components:   RBC 3.57 (*)    HCT 35.5 (*)    Platelets 133 (*)    All other components within normal limits  URINALYSIS, COMPLETE (UACMP) WITH MICROSCOPIC - Abnormal; Notable for the following components:   Color, Urine YELLOW (*)    APPearance HAZY (*)    Ketones, ur 5 (*)    Leukocytes,Ua MODERATE (*)    All other components within normal limits  URINE CULTURE  TROPONIN I (HIGH SENSITIVITY)   ____________________________________________  EKG  ED ECG REPORT I, Konnie Noffsinger J, the attending physician, personally viewed and interpreted this ECG.   Date: 07/20/2020  EKG Time: 2238  Rate: 81  Rhythm: normal EKG, normal sinus rhythm  Axis: Normal  Intervals:none  ST&T Change: Nonspecific  ____________________________________________  RADIOLOGY I, Robie Oats J, personally viewed and evaluated these images (plain radiographs) as part of my medical decision making, as well as reviewing the written report by the radiologist.  ED MD interpretation: No ICH, stable chronic odontoid fracture  Official radiology report(s): CT Head Wo Contrast  Result Date: 07/19/2020 CLINICAL DATA:  Fall, head injury, cervical spine fracture EXAM: CT HEAD WITHOUT CONTRAST CT CERVICAL SPINE  WITHOUT CONTRAST TECHNIQUE: Multidetector CT imaging of the head and cervical spine was performed following the standard protocol without intravenous contrast. Multiplanar CT image reconstructions of the cervical spine were also generated. COMPARISON:  04/05/2018 FINDINGS: CT HEAD FINDINGS Brain: Normal anatomic configuration. Parenchymal volume loss is commensurate with the patient's age. Mild periventricular white matter changes are present likely reflecting the sequela of small vessel ischemia. No abnormal intra or extra-axial mass lesion or fluid collection. No abnormal mass effect or midline shift. No evidence of acute intracranial hemorrhage or infarct. Ventricular size is normal. Cerebellum unremarkable. Vascular: There is stable asymmetric hyperdensity within the right MCA distally most in keeping with intracranial atherosclerotic disease. No new asymmetric hyperdensity within the intracranial vasculature. Skull: Intact Sinuses/Orbits: The paranasal sinuses are clear. Right extraocular intraorbital prosthetic again noted. The orbits are otherwise unremarkable. Other: Mastoid air cells and middle ear cavities are clear. CT CERVICAL SPINE FINDINGS Alignment: There is again noted a a a type 2 dens fracture with 2 mm distraction and 3 mm posterior displacement of the odontoid process in relation to the body of C2. There is interval progressive posttraumatic fusion of the anterior arch of C1 and the odontoid process. Posterior subluxation of the lateral masses of C1 in relation of the body of C2 is again identified and appears stable since prior examination. The occipitoatlantal articulations appear aligned with mild to moderate degenerative changes again noted. There is accentuated lordosis at the a craniocervical junction secondary to odontoid fracture, unchanged from prior examination. There is superimposed 4 mm anterolisthesis of C3 upon C4 and C4 upon C5, slightly progressive since prior examination. Skull  base and vertebrae: There is no acute fracture of the cervical spine. Vertebral body height has been preserved. Soft tissues and spinal canal: There is mild narrowing of the spinal canal at the a craniocervical junction secondary to mild retrolisthesis of the odontoid process and posterior subluxation of C1 in relation to C2. This appears stable since prior examination. There is moderate central canal stenosis at C3-4 secondary to anterolisthesis in combination with degenerative change. Milder canal stenosis is noted at C4-5 for similar reasons. No canal hematoma. The prevertebral soft tissues are not thickened. No paraspinal fluid collection is identified. Disc levels: There is intervertebral disc space  narrowing and endplate remodeling throughout the cervical spine, most severe at C4-5 and C5-6 in keeping with changes of moderate to severe degenerative disc disease. This is progressive since prior examination. Review of the axial images demonstrates multilevel uncovertebral and facet arthrosis resulting in moderate to severe bilateral neuroforaminal narrowing at C3-4, severe left neuroforaminal narrowing at C4-5, mild bilateral neuroforaminal narrowing at C5-6, a moderate right and mild left neuroforaminal narrowing at C6-7. Upper chest: Unremarkable Other: None IMPRESSION: No acute intracranial hemorrhage or infarct.  No calvarial fracture. Stable mild senescent change. Chronic type 2 odontoid fracture with displacement and posterior subluxation of C1 upon C2. Resultant mild central canal stenosis at the craniocervical junction. These findings appear stable since prior examination. Progressive degenerative change within the cervical spine with progressive anterolisthesis of C3-4 and C4-5 with associated moderate central canal stenosis at these levels resultantly. Slight progression of multilevel moderate to severe neuroforaminal narrowing as outlined above. No acute fracture or traumatic listhesis of the cervical  spine. Electronically Signed   By: Helyn Numbers MD   On: 07/19/2020 23:30   CT Cervical Spine Wo Contrast  Result Date: 07/19/2020 CLINICAL DATA:  Fall, head injury, cervical spine fracture EXAM: CT HEAD WITHOUT CONTRAST CT CERVICAL SPINE WITHOUT CONTRAST TECHNIQUE: Multidetector CT imaging of the head and cervical spine was performed following the standard protocol without intravenous contrast. Multiplanar CT image reconstructions of the cervical spine were also generated. COMPARISON:  04/05/2018 FINDINGS: CT HEAD FINDINGS Brain: Normal anatomic configuration. Parenchymal volume loss is commensurate with the patient's age. Mild periventricular white matter changes are present likely reflecting the sequela of small vessel ischemia. No abnormal intra or extra-axial mass lesion or fluid collection. No abnormal mass effect or midline shift. No evidence of acute intracranial hemorrhage or infarct. Ventricular size is normal. Cerebellum unremarkable. Vascular: There is stable asymmetric hyperdensity within the right MCA distally most in keeping with intracranial atherosclerotic disease. No new asymmetric hyperdensity within the intracranial vasculature. Skull: Intact Sinuses/Orbits: The paranasal sinuses are clear. Right extraocular intraorbital prosthetic again noted. The orbits are otherwise unremarkable. Other: Mastoid air cells and middle ear cavities are clear. CT CERVICAL SPINE FINDINGS Alignment: There is again noted a a a type 2 dens fracture with 2 mm distraction and 3 mm posterior displacement of the odontoid process in relation to the body of C2. There is interval progressive posttraumatic fusion of the anterior arch of C1 and the odontoid process. Posterior subluxation of the lateral masses of C1 in relation of the body of C2 is again identified and appears stable since prior examination. The occipitoatlantal articulations appear aligned with mild to moderate degenerative changes again noted. There is  accentuated lordosis at the a craniocervical junction secondary to odontoid fracture, unchanged from prior examination. There is superimposed 4 mm anterolisthesis of C3 upon C4 and C4 upon C5, slightly progressive since prior examination. Skull base and vertebrae: There is no acute fracture of the cervical spine. Vertebral body height has been preserved. Soft tissues and spinal canal: There is mild narrowing of the spinal canal at the a craniocervical junction secondary to mild retrolisthesis of the odontoid process and posterior subluxation of C1 in relation to C2. This appears stable since prior examination. There is moderate central canal stenosis at C3-4 secondary to anterolisthesis in combination with degenerative change. Milder canal stenosis is noted at C4-5 for similar reasons. No canal hematoma. The prevertebral soft tissues are not thickened. No paraspinal fluid collection is identified. Disc levels: There is intervertebral disc  space narrowing and endplate remodeling throughout the cervical spine, most severe at C4-5 and C5-6 in keeping with changes of moderate to severe degenerative disc disease. This is progressive since prior examination. Review of the axial images demonstrates multilevel uncovertebral and facet arthrosis resulting in moderate to severe bilateral neuroforaminal narrowing at C3-4, severe left neuroforaminal narrowing at C4-5, mild bilateral neuroforaminal narrowing at C5-6, a moderate right and mild left neuroforaminal narrowing at C6-7. Upper chest: Unremarkable Other: None IMPRESSION: No acute intracranial hemorrhage or infarct.  No calvarial fracture. Stable mild senescent change. Chronic type 2 odontoid fracture with displacement and posterior subluxation of C1 upon C2. Resultant mild central canal stenosis at the craniocervical junction. These findings appear stable since prior examination. Progressive degenerative change within the cervical spine with progressive anterolisthesis  of C3-4 and C4-5 with associated moderate central canal stenosis at these levels resultantly. Slight progression of multilevel moderate to severe neuroforaminal narrowing as outlined above. No acute fracture or traumatic listhesis of the cervical spine. Electronically Signed   By: Helyn Numbers MD   On: 07/19/2020 23:30     ____________________________________________   PROCEDURES  Procedure(s) performed (including Critical Care):  Procedures   ____________________________________________   INITIAL IMPRESSION / ASSESSMENT AND PLAN / ED COURSE  As part of my medical decision making, I reviewed the following data within the electronic MEDICAL RECORD NUMBER History obtained from family, Nursing notes reviewed and incorporated, Labs reviewed, EKG interpreted, Old chart reviewed, Radiograph reviewed and Notes from prior ED visits     87 year old female presenting status post unwitnessed fall.  Differential diagnosis includes but is not limited to mechanical fall, CVA, ICH ACS, infectious, metabolic etiologies, etc.  Laboratory and imaging studies unremarkable.  Will obtain UA to check for UTI.  Clinical Course as of 07/20/20 0524  Thu Jul 20, 2020  0243 Updated patient and niece on urine results.  Will start Keflex.  Patient takes losartan for blood pressure; will have facility recheck.  Strict return precautions given.  Patient and niece verbalized understanding agree with plan of care. [JS]    Clinical Course User Index [JS] Irean Hong, MD     ____________________________________________   FINAL CLINICAL IMPRESSION(S) / ED DIAGNOSES  Final diagnoses:  Fall, initial encounter  AKI (acute kidney injury) (HCC)  Urinary tract infection without hematuria, site unspecified     ED Discharge Orders          Ordered    cephALEXin (KEFLEX) 250 MG capsule  3 times daily        07/20/20 0247             Note:  This document was prepared using Dragon voice recognition  software and may include unintentional dictation errors.   Irean Hong, MD 07/20/20 [phone]

## 2020-07-20 NOTE — ED Notes (Signed)
Patient denies pain & n/v at this time.

## 2020-07-20 NOTE — Discharge Instructions (Addendum)
1.  Start antibiotic as prescribed (Keflex 250mg  3 times daily x7 days). 2.  Drink plenty of fluids daily. 3.  Recheck blood pressure in 1 day. 4.  Return to the ER for worsening symptoms, persistent vomiting, difficulty breathing or other concerns

## 2020-07-21 LAB — URINE CULTURE

## 2020-07-25 DIAGNOSIS — W19XXXA Unspecified fall, initial encounter: Secondary | ICD-10-CM | POA: Diagnosis not present

## 2020-07-25 DIAGNOSIS — N390 Urinary tract infection, site not specified: Secondary | ICD-10-CM | POA: Diagnosis not present

## 2020-07-25 DIAGNOSIS — E7849 Other hyperlipidemia: Secondary | ICD-10-CM | POA: Diagnosis not present

## 2020-07-25 DIAGNOSIS — R2681 Unsteadiness on feet: Secondary | ICD-10-CM | POA: Diagnosis not present

## 2020-08-04 DIAGNOSIS — Z79899 Other long term (current) drug therapy: Secondary | ICD-10-CM | POA: Diagnosis not present

## 2020-08-08 DIAGNOSIS — Z8673 Personal history of transient ischemic attack (TIA), and cerebral infarction without residual deficits: Secondary | ICD-10-CM | POA: Diagnosis not present

## 2020-08-08 DIAGNOSIS — D7589 Other specified diseases of blood and blood-forming organs: Secondary | ICD-10-CM | POA: Diagnosis not present

## 2020-08-08 DIAGNOSIS — F0150 Vascular dementia without behavioral disturbance: Secondary | ICD-10-CM | POA: Diagnosis not present

## 2020-08-08 DIAGNOSIS — R197 Diarrhea, unspecified: Secondary | ICD-10-CM | POA: Diagnosis not present

## 2020-09-04 ENCOUNTER — Emergency Department: Payer: Medicare Other

## 2020-09-04 ENCOUNTER — Other Ambulatory Visit: Payer: Self-pay

## 2020-09-04 ENCOUNTER — Observation Stay (HOSPITAL_BASED_OUTPATIENT_CLINIC_OR_DEPARTMENT_OTHER)
Admission: EM | Admit: 2020-09-04 | Discharge: 2020-09-04 | Disposition: A | Discharge status: Home / Self Care | Payer: Medicare Other | Source: Home / Self Care | Attending: Emergency Medicine | Admitting: Emergency Medicine

## 2020-09-04 ENCOUNTER — Encounter: Payer: Self-pay | Admitting: Emergency Medicine

## 2020-09-04 DIAGNOSIS — I10 Essential (primary) hypertension: Secondary | ICD-10-CM | POA: Diagnosis present

## 2020-09-04 DIAGNOSIS — R296 Repeated falls: Secondary | ICD-10-CM

## 2020-09-04 DIAGNOSIS — M545 Low back pain, unspecified: Secondary | ICD-10-CM

## 2020-09-04 DIAGNOSIS — Z7189 Other specified counseling: Secondary | ICD-10-CM | POA: Diagnosis not present

## 2020-09-04 DIAGNOSIS — M25512 Pain in left shoulder: Secondary | ICD-10-CM | POA: Insufficient documentation

## 2020-09-04 DIAGNOSIS — S12190A Other displaced fracture of second cervical vertebra, initial encounter for closed fracture: Secondary | ICD-10-CM | POA: Diagnosis not present

## 2020-09-04 DIAGNOSIS — S3993XA Unspecified injury of pelvis, initial encounter: Secondary | ICD-10-CM | POA: Diagnosis not present

## 2020-09-04 DIAGNOSIS — M25519 Pain in unspecified shoulder: Secondary | ICD-10-CM | POA: Diagnosis not present

## 2020-09-04 DIAGNOSIS — S22080S Wedge compression fracture of T11-T12 vertebra, sequela: Secondary | ICD-10-CM | POA: Diagnosis not present

## 2020-09-04 DIAGNOSIS — G8929 Other chronic pain: Secondary | ICD-10-CM | POA: Diagnosis not present

## 2020-09-04 DIAGNOSIS — M8448XD Pathological fracture, other site, subsequent encounter for fracture with routine healing: Secondary | ICD-10-CM | POA: Diagnosis not present

## 2020-09-04 DIAGNOSIS — R0902 Hypoxemia: Secondary | ICD-10-CM | POA: Diagnosis not present

## 2020-09-04 DIAGNOSIS — F0150 Vascular dementia without behavioral disturbance: Secondary | ICD-10-CM | POA: Diagnosis present

## 2020-09-04 DIAGNOSIS — Z20822 Contact with and (suspected) exposure to covid-19: Secondary | ICD-10-CM | POA: Insufficient documentation

## 2020-09-04 DIAGNOSIS — M47816 Spondylosis without myelopathy or radiculopathy, lumbar region: Secondary | ICD-10-CM | POA: Diagnosis not present

## 2020-09-04 DIAGNOSIS — Z8673 Personal history of transient ischemic attack (TIA), and cerebral infarction without residual deficits: Secondary | ICD-10-CM | POA: Insufficient documentation

## 2020-09-04 DIAGNOSIS — M4854XA Collapsed vertebra, not elsewhere classified, thoracic region, initial encounter for fracture: Secondary | ICD-10-CM | POA: Diagnosis not present

## 2020-09-04 DIAGNOSIS — M5459 Other low back pain: Secondary | ICD-10-CM | POA: Diagnosis not present

## 2020-09-04 DIAGNOSIS — M160 Bilateral primary osteoarthritis of hip: Secondary | ICD-10-CM | POA: Diagnosis not present

## 2020-09-04 DIAGNOSIS — M25551 Pain in right hip: Secondary | ICD-10-CM | POA: Diagnosis not present

## 2020-09-04 DIAGNOSIS — Y92129 Unspecified place in nursing home as the place of occurrence of the external cause: Secondary | ICD-10-CM | POA: Diagnosis not present

## 2020-09-04 DIAGNOSIS — M79602 Pain in left arm: Secondary | ICD-10-CM | POA: Diagnosis not present

## 2020-09-04 DIAGNOSIS — Z79899 Other long term (current) drug therapy: Secondary | ICD-10-CM | POA: Insufficient documentation

## 2020-09-04 DIAGNOSIS — Z825 Family history of asthma and other chronic lower respiratory diseases: Secondary | ICD-10-CM | POA: Diagnosis not present

## 2020-09-04 DIAGNOSIS — M47812 Spondylosis without myelopathy or radiculopathy, cervical region: Secondary | ICD-10-CM | POA: Diagnosis not present

## 2020-09-04 DIAGNOSIS — S13120A Subluxation of C1/C2 cervical vertebrae, initial encounter: Secondary | ICD-10-CM | POA: Diagnosis not present

## 2020-09-04 DIAGNOSIS — E785 Hyperlipidemia, unspecified: Secondary | ICD-10-CM | POA: Diagnosis not present

## 2020-09-04 DIAGNOSIS — M549 Dorsalgia, unspecified: Secondary | ICD-10-CM | POA: Diagnosis not present

## 2020-09-04 DIAGNOSIS — Z515 Encounter for palliative care: Secondary | ICD-10-CM | POA: Diagnosis not present

## 2020-09-04 DIAGNOSIS — S3210XA Unspecified fracture of sacrum, initial encounter for closed fracture: Secondary | ICD-10-CM | POA: Diagnosis not present

## 2020-09-04 DIAGNOSIS — Z66 Do not resuscitate: Secondary | ICD-10-CM | POA: Diagnosis not present

## 2020-09-04 DIAGNOSIS — M25552 Pain in left hip: Secondary | ICD-10-CM | POA: Diagnosis not present

## 2020-09-04 DIAGNOSIS — M419 Scoliosis, unspecified: Secondary | ICD-10-CM | POA: Diagnosis present

## 2020-09-04 DIAGNOSIS — G319 Degenerative disease of nervous system, unspecified: Secondary | ICD-10-CM | POA: Diagnosis not present

## 2020-09-04 DIAGNOSIS — M5136 Other intervertebral disc degeneration, lumbar region: Secondary | ICD-10-CM | POA: Diagnosis present

## 2020-09-04 DIAGNOSIS — M1612 Unilateral primary osteoarthritis, left hip: Secondary | ICD-10-CM | POA: Diagnosis not present

## 2020-09-04 DIAGNOSIS — M62838 Other muscle spasm: Secondary | ICD-10-CM | POA: Diagnosis not present

## 2020-09-04 DIAGNOSIS — Z043 Encounter for examination and observation following other accident: Secondary | ICD-10-CM | POA: Diagnosis not present

## 2020-09-04 DIAGNOSIS — F32A Depression, unspecified: Secondary | ICD-10-CM | POA: Diagnosis not present

## 2020-09-04 DIAGNOSIS — Z8249 Family history of ischemic heart disease and other diseases of the circulatory system: Secondary | ICD-10-CM | POA: Diagnosis not present

## 2020-09-04 DIAGNOSIS — R001 Bradycardia, unspecified: Secondary | ICD-10-CM | POA: Diagnosis not present

## 2020-09-04 DIAGNOSIS — S12111A Posterior displaced Type II dens fracture, initial encounter for closed fracture: Secondary | ICD-10-CM | POA: Diagnosis not present

## 2020-09-04 DIAGNOSIS — R627 Adult failure to thrive: Secondary | ICD-10-CM | POA: Diagnosis not present

## 2020-09-04 DIAGNOSIS — S22080A Wedge compression fracture of T11-T12 vertebra, initial encounter for closed fracture: Secondary | ICD-10-CM | POA: Diagnosis not present

## 2020-09-04 DIAGNOSIS — W19XXXA Unspecified fall, initial encounter: Secondary | ICD-10-CM | POA: Diagnosis not present

## 2020-09-04 DIAGNOSIS — Z681 Body mass index (BMI) 19 or less, adult: Secondary | ICD-10-CM | POA: Diagnosis not present

## 2020-09-04 DIAGNOSIS — E440 Moderate protein-calorie malnutrition: Secondary | ICD-10-CM | POA: Diagnosis not present

## 2020-09-04 DIAGNOSIS — R2681 Unsteadiness on feet: Secondary | ICD-10-CM | POA: Diagnosis not present

## 2020-09-04 DIAGNOSIS — M8468XA Pathological fracture in other disease, other site, initial encounter for fracture: Secondary | ICD-10-CM | POA: Diagnosis not present

## 2020-09-04 DIAGNOSIS — M51369 Other intervertebral disc degeneration, lumbar region without mention of lumbar back pain or lower extremity pain: Secondary | ICD-10-CM | POA: Diagnosis present

## 2020-09-04 LAB — CK: Total CK: 64 U/L (ref 38–234)

## 2020-09-04 LAB — URINALYSIS, COMPLETE (UACMP) WITH MICROSCOPIC
Bacteria, UA: NONE SEEN
Bilirubin Urine: NEGATIVE
Glucose, UA: NEGATIVE mg/dL
Hgb urine dipstick: NEGATIVE
Ketones, ur: 20 mg/dL — AB
Leukocytes,Ua: NEGATIVE
Nitrite: NEGATIVE
Protein, ur: 30 mg/dL — AB
Specific Gravity, Urine: 1.023 (ref 1.005–1.030)
pH: 5 (ref 5.0–8.0)

## 2020-09-04 LAB — CBC
HCT: 36.7 % (ref 36.0–46.0)
Hemoglobin: 12.6 g/dL (ref 12.0–15.0)
MCH: 34.1 pg — ABNORMAL HIGH (ref 26.0–34.0)
MCHC: 34.3 g/dL (ref 30.0–36.0)
MCV: 99.5 fL (ref 80.0–100.0)
Platelets: 160 10*3/uL (ref 150–400)
RBC: 3.69 MIL/uL — ABNORMAL LOW (ref 3.87–5.11)
RDW: 13.1 % (ref 11.5–15.5)
WBC: 7.4 10*3/uL (ref 4.0–10.5)
nRBC: 0 % (ref 0.0–0.2)

## 2020-09-04 LAB — BASIC METABOLIC PANEL
Anion gap: 9 (ref 5–15)
BUN: 24 mg/dL — ABNORMAL HIGH (ref 8–23)
CO2: 26 mmol/L (ref 22–32)
Calcium: 10.2 mg/dL (ref 8.9–10.3)
Chloride: 105 mmol/L (ref 98–111)
Creatinine, Ser: 0.82 mg/dL (ref 0.44–1.00)
GFR, Estimated: >60 mL/min (ref >60)
Glucose, Bld: 111 mg/dL — ABNORMAL HIGH (ref 70–99)
Potassium: 3.6 mmol/L (ref 3.5–5.1)
Sodium: 140 mmol/L (ref 135–145)

## 2020-09-04 LAB — RESP PANEL BY RT-PCR (FLU A&B, COVID) ARPGX2
Influenza A by PCR: NEGATIVE
Influenza B by PCR: NEGATIVE
SARS Coronavirus 2 by RT PCR: NEGATIVE

## 2020-09-04 MED ORDER — AMLODIPINE BESYLATE 5 MG PO TABS: 5.0000 mg | ORAL_TABLET | Freq: Every day | ORAL | Status: DC

## 2020-09-04 MED ORDER — SODIUM CHLORIDE 0.9% FLUSH
3.0000 mL | Freq: Two times a day (BID) | INTRAVENOUS | Status: DC
  Administered 2020-09-04: 3 mL via INTRAVENOUS

## 2020-09-04 MED ORDER — ONDANSETRON HCL 4 MG/2ML IJ SOLN: 4.0000 mg | Freq: Four times a day (QID) | INTRAMUSCULAR | Status: DC | PRN

## 2020-09-04 MED ORDER — LOSARTAN POTASSIUM 50 MG PO TABS: 50.0000 mg | ORAL_TABLET | Freq: Every day | ORAL | Status: DC

## 2020-09-04 MED ORDER — SODIUM CHLORIDE 0.9% FLUSH: 3.0000 mL | INTRAVENOUS | Status: DC | PRN

## 2020-09-04 MED ORDER — ENOXAPARIN SODIUM 40 MG/0.4ML IJ SOSY: 40.0000 mg | PREFILLED_SYRINGE | INTRAMUSCULAR | Status: DC

## 2020-09-04 MED ORDER — LIDOCAINE 5 % EX PTCH
1.0000 | MEDICATED_PATCH | CUTANEOUS | Status: DC
  Filled 2020-09-04: qty 1

## 2020-09-04 MED ORDER — FENTANYL CITRATE (PF) 100 MCG/2ML IJ SOLN
12.5000 ug | Freq: Once | INTRAMUSCULAR | Status: AC
  Administered 2020-09-04: 12.5 ug via INTRAVENOUS
  Filled 2020-09-04: qty 2

## 2020-09-04 MED ORDER — MULTIVITAMIN ADULT PO TABS: 1.0000 | ORAL_TABLET | Freq: Every day | ORAL | Status: DC

## 2020-09-04 MED ORDER — ATORVASTATIN CALCIUM 20 MG PO TABS: 40.0000 mg | ORAL_TABLET | Freq: Every day | ORAL | Status: DC

## 2020-09-04 MED ORDER — ADULT MULTIVITAMIN W/MINERALS CH: 1.0000 | ORAL_TABLET | Freq: Every day | ORAL | Status: DC

## 2020-09-04 MED ORDER — ACETAMINOPHEN 325 MG PO TABS
650.0000 mg | ORAL_TABLET | ORAL | Status: AC
  Administered 2020-09-04: 650 mg via ORAL
  Filled 2020-09-04: qty 2

## 2020-09-04 MED ORDER — SODIUM CHLORIDE 0.9 % IV SOLN: 250.0000 mL | INTRAVENOUS | Status: DC | PRN

## 2020-09-04 MED ORDER — ACETAMINOPHEN 325 MG PO TABS: 650.0000 mg | ORAL_TABLET | Freq: Four times a day (QID) | ORAL | Status: DC | PRN

## 2020-09-04 MED ORDER — SERTRALINE HCL 50 MG PO TABS: 100.0000 mg | ORAL_TABLET | Freq: Every day | ORAL | Status: DC

## 2020-09-04 MED ORDER — ONDANSETRON HCL 4 MG PO TABS: 4.0000 mg | ORAL_TABLET | Freq: Four times a day (QID) | ORAL | Status: DC | PRN

## 2020-09-04 MED ORDER — VITAMIN D 25 MCG (1000 UNIT) PO TABS: 2000.0000 [IU] | ORAL_TABLET | Freq: Every day | ORAL | Status: DC

## 2020-09-04 MED ORDER — FENTANYL CITRATE (PF) 100 MCG/2ML IJ SOLN
12.5000 ug | Freq: Once | INTRAMUSCULAR | Status: DC
  Filled 2020-09-04: qty 2

## 2020-09-04 NOTE — ED Notes (Signed)
Pt at CT

## 2020-09-04 NOTE — H&P (Signed)
History and Physical    [name] ZOX:[id number] DOB: Mar 22, 1930 DOA: 09/04/2020  PCP: Judy Pimple, MD   Patient coming from: [facility]  I have personally briefly reviewed patient's old medical records in [facility]  Chief Complaint: S/p Fall  Most of the history was obtained from the ER notes and patient's healthcare power of attorney Ms Sharma Covert over the phone.  HPI: [name] is a 87 y.o. female with medical history significant for chronic low back pain, hypertension, vascular dementia, history of CVA and frequent falls who was sent to the emergency room by EMS for evaluation of an unwitnessed fall. Patient was found on the floor and it is unclear how long she was down for.  She complained of left shoulder pain and was found in the fetal position.  Nursing home staff sent her to the emergency room for further evaluation. Patient had multiple imaging studies done in the ER which showed chronic fractures but nothing acute.  Attempts were made to ambulate the patient and she was unable to due to low back pain so the ER physician requested observation for pain control. I called and spoke to patient's healthcare power of attorney who states that she has a history of chronic low back pain and has had 3 falls in the last 6 weeks.  She states that patient is mostly bedbound and needs a lot of encouragement to get out of bed and ambulate. I am unable to do review of systems on this patient due to her underlying dementia. Labs show sodium 140, potassium 3.6, chloride 105, bicarb 26, glucose 111, BUN 24, creatinine 0.82, calcium 10.2, total CK 64, white count 7.4, hemoglobin 12.6, hematocrit 36.7, MCV 99.5, RDW 13.1, platelet count 160 CT scan of the head without contrast shows atrophic, chronic microvascular disease, no acute intracranial abnormality. 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 Displacement and posterior subluxation of the  odontoid process and C1 relative to C2. Appearance is stable since prior study. Advanced degenerative disc and facet disease. No acute bony abnormality. CT thoracic spine shows no acute abnormality. Remote T12 compression fracture. CT scan of the lumbar spine shows No acute abnormality. Remote T12 compression fracture. Severe convex right scoliosis. Hiatal hernia. Aortic Atherosclerosis . X-ray of the left humerus is negative for acute fracture. Twelve-lead EKG reviewed by me shows normal sinus rhythm with sinus arrhythmia.  LVH.  T wave abnormality in the anterior leads.   ED Course: Patient is an 87 year old Caucasian female who resides at a skilled nursing facility and who presents to the ER for evaluation of an unwitnessed fall.  She complained of left shoulder pain but x-rays negative for acute fracture. Attempts are made to ambulate patient in the ER but she complained of low back pain. She will be referred to observation status for further evaluation.    Review of Systems: As per HPI otherwise all other systems reviewed and negative.    Past Medical History:  Diagnosis Date   Chronic pain    DDD (degenerative disc disease)    Diverticulosis    Hyperlipidemia    Hypertension    Osteopenia    Stroke (HCC)    Syncope    Vascular dementia [facility])     Past Surgical History:  Procedure Laterality Date   ABDOMINAL HYSTERECTOMY     carotid doppler     normal   DOPPLER ECHOCARDIOGRAPHY     normal   EXPLORATORY LAPAROTOMY  2 times both neg    LAMINECTOMY  06-2003   l2-l4   SPINE SURGERY     l3-l4 microdiscectomy    TONSILLECTOMY       reports that she has never smoked. She has never used smokeless tobacco. She reports that she does not drink alcohol and does not use drugs.  Allergies  Allergen Reactions   Celecoxib Other (See Comments)    affects kidneys   Codeine Nausea Only and Other (See Comments)    dizziness   Lisinopril Cough   Etodolac Rash    Family  History  Problem Relation Age of Onset   Hypertension Father    COPD Father    Other Mother        Deceased, 73   Healthy Brother       Prior to Admission medications   Medication Sig Start Date End Date Taking? Authorizing Provider  acetaminophen (TYLENOL) 325 MG tablet Take 650 mg by mouth every 6 (six) hours as needed.   Yes [provider]  amLODipine (NORVASC) 5 MG tablet Take 5 mg by mouth daily.   Yes [provider]  atorvastatin (LIPITOR) 40 MG tablet Take 1 tablet (40 mg total) by mouth at bedtime. 02/25/17 09/04/20 Yes Tower, Audrie Gallus, MD  Cholecalciferol (VITAMIN D PO) Take 2,000 Units by mouth daily.   Yes [provider]  losartan (COZAAR) 100 MG tablet TAKE 1/2 TABLET (50 MG TOTAL) BY MOUTH DAILY. Patient taking differently: Take 100 mg by mouth daily. 02/25/17  Yes Tower, Audrie Gallus, MD  Multiple Vitamin (MULTIVITAMIN ADULT) TABS Take 1 tablet by mouth daily.   Yes [provider]  sertraline (ZOLOFT) 100 MG tablet Take 1 tablet (100 mg total) by mouth daily. 07/19/19  Yes Tower, Audrie Gallus, MD  cephALEXin (KEFLEX) 250 MG capsule Take 1 capsule (250 mg total) by mouth 3 (three) times daily. 07/20/20   Irean Hong, MD    Physical Exam: Vitals:   09/04/20 1100 09/04/20 1200 09/04/20 1300 09/04/20 1510  BP: (!) 154/92 (!) 156/98 (!) 150/88 130/80  Pulse: 89 72 70 66  Resp: 20 20 20 20   Temp:      TempSrc:      SpO2: 95% 97% 99% 97%  Weight:      Height:         Vitals:   09/04/20 1100 09/04/20 1200 09/04/20 1300 09/04/20 1510  BP: (!) 154/92 (!) 156/98 (!) 150/88 130/80  Pulse: 89 72 70 66  Resp: 20 20 20 20   Temp:      TempSrc:      SpO2: 95% 97% 99% 97%  Weight:      Height:          Constitutional: Alert and oriented x 1 .  Oriented only to person.  Not in any apparent distress HEENT:      Head: Normocephalic and atraumatic.         Eyes: PERLA, EOMI, Conjunctivae are normal. Sclera is non-icteric.       Mouth/Throat:  Mucous membranes are moist.       Neck: Supple with no signs of meningismus. Cardiovascular: Regular rate and rhythm. No murmurs, gallops, or rubs. 2+ symmetrical distal pulses are present . No JVD. No LE edema Respiratory: Respiratory effort normal .Lungs sounds clear bilaterally. No wheezes, crackles, or rhonchi.  Gastrointestinal: Soft, non tender, and non distended with positive bowel sounds.  Genitourinary: No CVA tenderness. Musculoskeletal: Nontender with normal range of motion in  all extremities. No cyanosis, or erythema of extremities. Neurologic: Unable to assess. Skin: Skin is warm, dry.  No rash or ulcers Psychiatric: Unable to assess   Labs on Admission: I have personally reviewed following labs and imaging studies  CBC: Recent Labs  Lab 09/04/20 0726  WBC 7.4  HGB 12.6  HCT 36.7  MCV 99.5  PLT 160   Basic Metabolic Panel: Recent Labs  Lab 09/04/20 0726  NA 140  K 3.6  CL 105  CO2 26  GLUCOSE 111*  BUN 24*  CREATININE 0.82  CALCIUM 10.2   GFR: Estimated Creatinine Clearance: 39.2 mL/min (by C-G formula based on SCr of 0.82 mg/dL). Liver Function Tests: No results for input(s): AST, ALT, ALKPHOS, BILITOT, PROT, ALBUMIN in the last 168 hours. No results for input(s): LIPASE, AMYLASE in the last 168 hours. No results for input(s): AMMONIA in the last 168 hours. Coagulation Profile: No results for input(s): INR, PROTIME in the last 168 hours. Cardiac Enzymes: Recent Labs  Lab 09/04/20 0726  CKTOTAL 64   BNP (last 3 results) No results for input(s): PROBNP in the last 8760 hours. HbA1C: No results for input(s): HGBA1C in the last 72 hours. CBG: No results for input(s): GLUCAP in the last 168 hours. Lipid Profile: No results for input(s): CHOL, HDL, LDLCALC, TRIG, CHOLHDL, LDLDIRECT in the last 72 hours. Thyroid Function Tests: No results for input(s): TSH, T4TOTAL, FREET4, T3FREE, THYROIDAB in the last 72 hours. Anemia Panel: No results for  input(s): VITAMINB12, FOLATE, FERRITIN, TIBC, IRON, RETICCTPCT in the last 72 hours. Urine analysis:    Component Value Date/Time   COLORURINE YELLOW (A) 09/04/2020 1135   APPEARANCEUR HAZY (A) 09/04/2020 1135   LABSPEC 1.023 09/04/2020 1135   PHURINE 5.0 09/04/2020 1135   GLUCOSEU NEGATIVE 09/04/2020 1135   HGBUR NEGATIVE 09/04/2020 1135   HGBUR trace-intact 02/08/2008 1152   BILIRUBINUR NEGATIVE 09/04/2020 1135   BILIRUBINUR Negative 09/29/2015 1157   KETONESUR 20 (A) 09/04/2020 1135   PROTEINUR 30 (A) 09/04/2020 1135   UROBILINOGEN 0.2 09/29/2015 1157   UROBILINOGEN 0.2 11/13/2014 2040   NITRITE NEGATIVE 09/04/2020 1135   LEUKOCYTESUR NEGATIVE 09/04/2020 1135    Radiological Exams on Admission: CT Head Wo Contrast  Result Date: 09/04/2020 CLINICAL DATA:  Unwitnessed fall EXAM: CT HEAD WITHOUT CONTRAST TECHNIQUE: Contiguous axial images were obtained from the base of the skull through the vertex without intravenous contrast. COMPARISON:  07/19/2020 FINDINGS: Brain: There is atrophy and chronic small vessel disease changes. No acute intracranial abnormality. Specifically, no hemorrhage, hydrocephalus, mass lesion, acute infarction, or significant intracranial injury. Vascular: No hyperdense vessel or unexpected calcification. Skull: No acute calvarial abnormality. Sinuses/Orbits: No acute findings Other: None IMPRESSION: Atrophy, chronic microvascular disease. No acute intracranial abnormality. Electronically Signed   By: Charlett NoseKevin  Dover M.D.   On: 09/04/2020 09:39   CT Cervical Spine Wo Contrast  Result Date: 09/04/2020 CLINICAL DATA:  Fall EXAM: CT CERVICAL SPINE WITHOUT CONTRAST TECHNIQUE: Multidetector CT imaging of the cervical spine was performed without intravenous contrast. Multiplanar CT image reconstructions were also generated. COMPARISON:  None. FINDINGS: Alignment: Continued 4 mm of anterolisthesis of C3 on C4 and C4 on C5 related to degenerative changes. 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 2 mm of distraction and 3 mm of posterior displacement of the odontoid process relative to the C2 vertebral body. Fusion of the anterior arch of C1 with the odontoid. No acute fracture. Soft tissues and spinal  canal: No prevertebral fluid or swelling. No visible canal hematoma. Disc levels: Diffuse degenerative disc disease. Advanced bilateral degenerative facet disease. 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 Displacement and posterior subluxation of the odontoid process and C1 relative to C2. Appearance is stable since prior study. Advanced degenerative disc and facet disease. No acute bony abnormality. Electronically Signed   By: Charlett Nose M.D.   On: 09/04/2020 09:45   CT Thoracic Spine Wo Contrast  Result Date: 09/04/2020 CLINICAL DATA:  Status post fall. Patient found down. Initial encounter. EXAM: CT THORACIC SPINE WITHOUT CONTRAST TECHNIQUE: Multidetector CT images of the thoracic were obtained using the standard protocol without intravenous contrast. COMPARISON:  Plain films lumbar spine 04/05/2018. FINDINGS: Alignment: No listhesis. There is some exaggeration of the normal thoracic kyphosis. Vertebrae: No acute fracture or focal pathologic process. Remote T12 compression fracture was present on the prior exam. Bones appear osteopenic. Paraspinal and other soft tissues: Aortic atherosclerosis. Cardiomegaly moderate hiatal hernia. There is some dependent atelectasis. Disc levels: There is mild bony retropulsion off the superior endplate of T12 causing mild spinal stenosis. Vacuum disc phenomenon is seen at T12-L1. Intervertebral disc spaces are otherwise unremarkable IMPRESSION: No acute abnormality. Remote T12 compression fracture. Electronically Signed   By: Drusilla Kanner M.D.   On: 09/04/2020 12:12   CT Lumbar Spine Wo Contrast  Result Date:  09/04/2020 CLINICAL DATA:  Status post fall. Patient found down. Initial encounter. EXAM: CT LUMBAR SPINE WITHOUT CONTRAST TECHNIQUE: Multidetector CT imaging of the lumbar spine was performed without intravenous contrast administration. Multiplanar CT image reconstructions were also generated. COMPARISON:  Plain films lumbar spine 04/05/2018. FINDINGS: Segmentation: Standard. Alignment: No listhesis. Severe convex right scoliosis with the apex at L3-4. Vertebrae: No acute fracture or focal pathologic process. Remote T12 compression fracture noted. Paraspinal and other soft tissues: Small calcifications in the kidneys may be vascular. No hydronephrosis. Aortic atherosclerosis. Disc levels: Loss of disc space height and vacuum disc phenomenon are seen at L1-2 and L2-3. Autologous fusion across the L3-4 and L4-5 disc interspaces is noted. IMPRESSION: No acute abnormality. Remote T12 compression fracture. Severe convex right scoliosis. Hiatal hernia. Aortic Atherosclerosis (ICD10-I70.0). Electronically Signed   By: Drusilla Kanner M.D.   On: 09/04/2020 12:17   DG Humerus Left  Result Date: 09/04/2020 CLINICAL DATA:  Left arm pain after fall today. EXAM: LEFT HUMERUS - 2+ VIEW COMPARISON:  None. FINDINGS: There is no evidence of fracture or other focal bone lesions. Soft tissues are unremarkable. IMPRESSION: Negative. Electronically Signed   By: Lupita Raider M.D.   On: 09/04/2020 12:51     Assessment/Plan Principal Problem:   Frequent falls Active Problems:   Essential hypertension   Scoliosis   Degenerative disc disease, lumbar   Vascular dementia (HCC)   Chronic pain      Frequent falls Patient is at high risk for falls and has had 3 falls in the last 6 weeks by her healthcare power of attorney. Imaging is negative for any acute fracture Will place patient on fall precautions Will request PT evaluation Patient will require close supervision at the skilled nursing facility upon  discharge.    Chronic low back pain Will place patient on Lidoderm patch Continue as needed Tylenol    Hypertension Continue amlodipine and losartan     Depression and vascular dementia Continue sertraline     DVT prophylaxis: Lovenox  Code Status: DO NOT RESUSCITATE Family Communication: Greater than  50% of time was spent discussing patient's condition and plan of care with her niece who is her healthcare power of attorney over the phone.  CODE STATUS was discussed and she is a DNR. Disposition Plan: Back to previous home environment Consults called: Physical therapy Status: Observation    Payson Crumby MD [facility]     09/04/2020, 3:59 PM

## 2020-09-04 NOTE — ED Notes (Signed)
Niece, POA, refuses to wait any further on the Hospitalist to sign the AMA form, States that she is leaving.  AMA paper is not signed. Patient left in stable condition via a wheelchair to the care of the POA neice

## 2020-09-04 NOTE — Discharge Summary (Addendum)
[facility] Physician Discharge Summary  [name] QMV:[id number] DOB: [birth year] DOA: 09/04/2020  AMA Discharge Summary   PCP: Judy Pimple, MD  Admit date: 09/04/2020 Discharge date: 09/04/2020   Brief/Interim Summary: Per admitting physician: "[name] is a 87 y.o. female with medical history significant for chronic low back pain, hypertension, vascular dementia, history of CVA and frequent falls who was sent to the emergency room by EMS for evaluation of an unwitnessed fall. Patient was found on the floor and it is unclear how long she was down for.  She complained of left shoulder pain and was found in the fetal position.  Nursing home staff sent her to the emergency room for further evaluation. Patient had multiple imaging studies done in the ER which showed chronic fractures but nothing acute.  Attempts were made to ambulate the patient and she was unable to due to low back pain so the ER physician requested observation for pain control". Patient decided to sign out AMA from the ED and niece came to pick her up. She left before she had a chance to be seen   Discharge Diagnoses:  Principal Problem:   Frequent falls Active Problems:   Essential hypertension   Scoliosis   Degenerative disc disease, lumbar   Vascular dementia (HCC)   Chronic pain      Allergies as of 09/04/2020       Reactions   Celecoxib Other (See Comments)   affects kidneys   Codeine Nausea Only, Other (See Comments)   dizziness   Lisinopril Cough   Etodolac Rash        Allergies  Allergen Reactions   Celecoxib Other (See Comments)    affects kidneys   Codeine Nausea Only and Other (See Comments)    dizziness   Lisinopril Cough   Etodolac Rash    Procedures/Studies: CT Head Wo Contrast  Result Date: 09/04/2020 CLINICAL DATA:  Unwitnessed fall EXAM: CT HEAD WITHOUT CONTRAST TECHNIQUE: Contiguous axial images were obtained from the base of the skull through the vertex without  intravenous contrast. COMPARISON:  07/19/2020 FINDINGS: Brain: There is atrophy and chronic small vessel disease changes. No acute intracranial abnormality. Specifically, no hemorrhage, hydrocephalus, mass lesion, acute infarction, or significant intracranial injury. Vascular: No hyperdense vessel or unexpected calcification. Skull: No acute calvarial abnormality. Sinuses/Orbits: No acute findings Other: None IMPRESSION: Atrophy, chronic microvascular disease. No acute intracranial abnormality. Electronically Signed   By: Charlett Nose M.D.   On: 09/04/2020 09:39   CT Cervical Spine Wo Contrast  Result Date: 09/04/2020 CLINICAL DATA:  Fall EXAM: CT CERVICAL SPINE WITHOUT CONTRAST TECHNIQUE: Multidetector CT imaging of the cervical spine was performed without intravenous contrast. Multiplanar CT image reconstructions were also generated. COMPARISON:  None. FINDINGS: Alignment: Continued 4 mm of anterolisthesis of C3 on C4 and C4 on C5 related to degenerative changes. Skull base and vertebrae: Again noted is the chronic type 2 odontoid fracture with nonunion. 2 mm of distraction and 3 mm of posterior displacement of the odontoid process relative to the C2 vertebral body. Fusion of the anterior arch of C1 with the odontoid. No acute fracture. Soft tissues and spinal canal: No prevertebral fluid or swelling. No visible canal hematoma. Disc levels: Diffuse degenerative disc disease. Advanced bilateral degenerative facet disease. 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 Displacement and posterior subluxation of the odontoid process and C1 relative to C2. Appearance is stable since prior study. Advanced degenerative disc and  facet disease. No acute bony abnormality. Electronically Signed   By: Charlett NoseKevin  Dover M.D.   On: 09/04/2020 09:45   CT Thoracic Spine Wo Contrast  Result Date: 09/04/2020 CLINICAL DATA:  Status post fall. Patient found down. Initial  encounter. EXAM: CT THORACIC SPINE WITHOUT CONTRAST TECHNIQUE: Multidetector CT images of the thoracic were obtained using the standard protocol without intravenous contrast. COMPARISON:  Plain films lumbar spine 04/05/2018. FINDINGS: Alignment: No listhesis. There is some exaggeration of the normal thoracic kyphosis. Vertebrae: No acute fracture or focal pathologic process. Remote T12 compression fracture was present on the prior exam. Bones appear osteopenic. Paraspinal and other soft tissues: Aortic atherosclerosis. Cardiomegaly moderate hiatal hernia. There is some dependent atelectasis. Disc levels: There is mild bony retropulsion off the superior endplate of T12 causing mild spinal stenosis. Vacuum disc phenomenon is seen at T12-L1. Intervertebral disc spaces are otherwise unremarkable IMPRESSION: No acute abnormality. Remote T12 compression fracture. Electronically Signed   By: Drusilla Kannerhomas  Dalessio M.D.   On: 09/04/2020 12:12   CT Lumbar Spine Wo Contrast  Result Date: 09/04/2020 CLINICAL DATA:  Status post fall. Patient found down. Initial encounter. EXAM: CT LUMBAR SPINE WITHOUT CONTRAST TECHNIQUE: Multidetector CT imaging of the lumbar spine was performed without intravenous contrast administration. Multiplanar CT image reconstructions were also generated. COMPARISON:  Plain films lumbar spine 04/05/2018. FINDINGS: Segmentation: Standard. Alignment: No listhesis. Severe convex right scoliosis with the apex at L3-4. Vertebrae: No acute fracture or focal pathologic process. Remote T12 compression fracture noted. Paraspinal and other soft tissues: Small calcifications in the kidneys may be vascular. No hydronephrosis. Aortic atherosclerosis. Disc levels: Loss of disc space height and vacuum disc phenomenon are seen at L1-2 and L2-3. Autologous fusion across the L3-4 and L4-5 disc interspaces is noted. IMPRESSION: No acute abnormality. Remote T12 compression fracture. Severe convex right scoliosis. Hiatal  hernia. Aortic Atherosclerosis (ICD10-I70.0). Electronically Signed   By: Drusilla Kannerhomas  Dalessio M.D.   On: 09/04/2020 12:17   DG Humerus Left  Result Date: 09/04/2020 CLINICAL DATA:  Left arm pain after fall today. EXAM: LEFT HUMERUS - 2+ VIEW COMPARISON:  None. FINDINGS: There is no evidence of fracture or other focal bone lesions. Soft tissues are unremarkable. IMPRESSION: Negative. Electronically Signed   By: Lupita RaiderJames  Green Jr M.D.   On: 09/04/2020 12:51   (Echo, Carotid, EGD, Colonoscopy, ERCP)    Subjective:   Discharge Exam: Vitals:   09/04/20 1300 09/04/20 1510  BP: (!) 150/88 130/80  Pulse: 70 66  Resp: 20 20  Temp:    SpO2: 99% 97%   Vitals:   09/04/20 1100 09/04/20 1200 09/04/20 1300 09/04/20 1510  BP: (!) 154/92 (!) 156/98 (!) 150/88 130/80  Pulse: 89 72 70 66  Resp: 20 20 20 20   Temp:      TempSrc:      SpO2: 95% 97% 99% 97%  Weight:      Height:         The results of significant diagnostics from this hospitalization (including imaging, microbiology, ancillary and laboratory) are listed below for reference.     Microbiology: Recent Results (from the past 240 hour(s))  Resp Panel by RT-PCR (Flu A&B, Covid) Nasopharyngeal Swab     Status: None   Collection Time: 09/04/20  3:03 PM   Specimen: Nasopharyngeal Swab; Nasopharyngeal(NP) swabs in vial transport medium  Result Value Ref Range Status   SARS Coronavirus 2 by RT PCR NEGATIVE NEGATIVE Final    Comment: (NOTE) SARS-CoV-2 target nucleic acids are NOT  DETECTED.  The SARS-CoV-2 RNA is generally detectable in upper respiratory specimens during the acute phase of infection. The lowest concentration of SARS-CoV-2 viral copies this assay can detect is 138 copies/mL. A negative result does not preclude SARS-Cov-2 infection and should not be used as the sole basis for treatment or other patient management decisions. A negative result may occur with  improper specimen collection/handling, submission of specimen  other than nasopharyngeal swab, presence of viral mutation(s) within the areas targeted by this assay, and inadequate number of viral copies(<138 copies/mL). A negative result must be combined with clinical observations, patient history, and epidemiological information. The expected result is Negative.  Fact Sheet for Patients:  BloggerCourse.com  Fact Sheet for Healthcare Providers:  SeriousBroker.it  This test is no t yet approved or cleared by the [place] FDA and  has been authorized for detection and/or diagnosis of SARS-CoV-2 by FDA under an Emergency Use Authorization (EUA). This EUA will remain  in effect (meaning this test can be used) for the duration of the COVID-19 declaration under Section 564(b)(1) of the Act, 21 U.S.C.section 360bbb-3(b)(1), unless the authorization is terminated  or revoked sooner.       Influenza A by PCR NEGATIVE NEGATIVE Final   Influenza B by PCR NEGATIVE NEGATIVE Final    Comment: (NOTE) The Xpert Xpress SARS-CoV-2/FLU/RSV plus assay is intended as an aid in the diagnosis of influenza from Nasopharyngeal swab specimens and should not be used as a sole basis for treatment. Nasal washings and aspirates are unacceptable for Xpert Xpress SARS-CoV-2/FLU/RSV testing.  Fact Sheet for Patients: BloggerCourse.com  Fact Sheet for Healthcare Providers: SeriousBroker.it  This test is not yet approved or cleared by the [place] FDA and has been authorized for detection and/or diagnosis of SARS-CoV-2 by FDA under an Emergency Use Authorization (EUA). This EUA will remain in effect (meaning this test can be used) for the duration of the COVID-19 declaration under Section 564(b)(1) of the Act, 21 U.S.C. section 360bbb-3(b)(1), unless the authorization is terminated or revoked.  Performed at [facility], [address]  [place], [place] [address]      Labs: BNP (last 3 results) No results for input(s): BNP in the last 8760 hours. Basic Metabolic Panel: Recent Labs  Lab 09/04/20 0726  NA 140  K 3.6  CL 105  CO2 26  GLUCOSE 111*  BUN 24*  CREATININE 0.82  CALCIUM 10.2   Liver Function Tests: No results for input(s): AST, ALT, ALKPHOS, BILITOT, PROT, ALBUMIN in the last 168 hours. No results for input(s): LIPASE, AMYLASE in the last 168 hours. No results for input(s): AMMONIA in the last 168 hours. CBC: Recent Labs  Lab 09/04/20 0726  WBC 7.4  HGB 12.6  HCT 36.7  MCV 99.5  PLT 160   Cardiac Enzymes: Recent Labs  Lab 09/04/20 0726  CKTOTAL 64   BNP: Invalid input(s): POCBNP CBG: No results for input(s): GLUCAP in the last 168 hours. D-Dimer No results for input(s): DDIMER in the last 72 hours. Hgb A1c No results for input(s): HGBA1C in the last 72 hours. Lipid Profile No results for input(s): CHOL, HDL, LDLCALC, TRIG, CHOLHDL, LDLDIRECT in the last 72 hours. Thyroid function studies No results for input(s): TSH, T4TOTAL, T3FREE, THYROIDAB in the last 72 hours.  Invalid input(s): FREET3 Anemia work up No results for input(s): VITAMINB12, FOLATE, FERRITIN, TIBC, IRON, RETICCTPCT in the last 72 hours. Urinalysis    Component Value Date/Time   COLORURINE YELLOW (A) 09/04/2020 1135  APPEARANCEUR HAZY (A) 09/04/2020 1135   LABSPEC 1.023 09/04/2020 1135   PHURINE 5.0 09/04/2020 1135   GLUCOSEU NEGATIVE 09/04/2020 1135   HGBUR NEGATIVE 09/04/2020 1135   HGBUR trace-intact 02/08/2008 1152   BILIRUBINUR NEGATIVE 09/04/2020 1135   BILIRUBINUR Negative 09/29/2015 1157   KETONESUR 20 (A) 09/04/2020 1135   PROTEINUR 30 (A) 09/04/2020 1135   UROBILINOGEN 0.2 09/29/2015 1157   UROBILINOGEN 0.2 11/13/2014 2040   NITRITE NEGATIVE 09/04/2020 1135   LEUKOCYTESUR NEGATIVE 09/04/2020 1135   Sepsis Labs Invalid input(s): PROCALCITONIN,  WBC,  LACTICIDVEN Microbiology Recent Results  (from the past 240 hour(s))  Resp Panel by RT-PCR (Flu A&B, Covid) Nasopharyngeal Swab     Status: None   Collection Time: 09/04/20  3:03 PM   Specimen: Nasopharyngeal Swab; Nasopharyngeal(NP) swabs in vial transport medium  Result Value Ref Range Status   SARS Coronavirus 2 by RT PCR NEGATIVE NEGATIVE Final    Comment: (NOTE) SARS-CoV-2 target nucleic acids are NOT DETECTED.  The SARS-CoV-2 RNA is generally detectable in upper respiratory specimens during the acute phase of infection. The lowest concentration of SARS-CoV-2 viral copies this assay can detect is 138 copies/mL. A negative result does not preclude SARS-Cov-2 infection and should not be used as the sole basis for treatment or other patient management decisions. A negative result may occur with  improper specimen collection/handling, submission of specimen other than nasopharyngeal swab, presence of viral mutation(s) within the areas targeted by this assay, and inadequate number of viral copies(<138 copies/mL). A negative result must be combined with clinical observations, patient history, and epidemiological information. The expected result is Negative.  Fact Sheet for Patients:  BloggerCourse.com  Fact Sheet for Healthcare Providers:  SeriousBroker.it  This test is no t yet approved or cleared by the [place] FDA and  has been authorized for detection and/or diagnosis of SARS-CoV-2 by FDA under an Emergency Use Authorization (EUA). This EUA will remain  in effect (meaning this test can be used) for the duration of the COVID-19 declaration under Section 564(b)(1) of the Act, 21 U.S.C.section 360bbb-3(b)(1), unless the authorization is terminated  or revoked sooner.       Influenza A by PCR NEGATIVE NEGATIVE Final   Influenza B by PCR NEGATIVE NEGATIVE Final    Comment: (NOTE) The Xpert Xpress SARS-CoV-2/FLU/RSV plus assay is intended as an aid in the  diagnosis of influenza from Nasopharyngeal swab specimens and should not be used as a sole basis for treatment. Nasal washings and aspirates are unacceptable for Xpert Xpress SARS-CoV-2/FLU/RSV testing.  Fact Sheet for Patients: BloggerCourse.com  Fact Sheet for Healthcare Providers: SeriousBroker.it  This test is not yet approved or cleared by the [place] FDA and has been authorized for detection and/or diagnosis of SARS-CoV-2 by FDA under an Emergency Use Authorization (EUA). This EUA will remain in effect (meaning this test can be used) for the duration of the COVID-19 declaration under Section 564(b)(1) of the Act, 21 U.S.C. section 360bbb-3(b)(1), unless the authorization is terminated or revoked.  Performed at [facility], [address], [place], [place] [address]      Time coordinating discharge: Over 30 minutes  SIGNED:   Andris Baumann, MD  [facility] 09/04/2020, 8:04 PM Pager

## 2020-09-04 NOTE — ED Provider Notes (Signed)
[facility] Emergency Department Provider Note   ____________________________________________   Event Date/Time   First MD Initiated Contact with Patient 09/04/20 1046     (approximate)  I have reviewed the triage vital signs and the nursing notes.   HISTORY  Chief Complaint Fall  EM caveat: Dementia  HPI [name] is a 87 y.o. female has a history of chronic back pain degenerative joint disease hypertension stroke vascular dementia  Unwitnessed fall.  Reported at her care facility.  Was found on the floor.  No obvious injury except there is concern that she was complaining of some pain around her left shoulder but also complaining of increased amount low back pain.  She has a history of back pain, but it seems to be worse after her fall.  Patient herself is alert able to tell me her name but disoriented to place.  She is able to tell me that her low back is hurting she reaches with her left arm and lays on her side pointing towards her low mid back as a source of pain.  She denies any other pain or injury.  She does not really know or recall falling.  She denies any neck pain she denies any upper back pain but starts to report mid back pain and pain all the way down to about the level of the sacrum.  Denies pain in the arms or legs, there was report of the left shoulder potentially causing some pain and discomfort but she does range this well.  Moves her lower extremities to command without difficulty or pain.  Past Medical History:  Diagnosis Date   Chronic pain    DDD (degenerative disc disease)    Diverticulosis    Hyperlipidemia    Hypertension    Osteopenia    Stroke [facility])    Syncope    Vascular dementia [facility])     Patient Active Problem List   Diagnosis Date Noted   Fall 09/04/2020   Frequent falls 09/04/2020   Chronic pain    Screening-pulmonary TB 05/20/2019   Cervical spine fracture (HCC) 12/18/2017   Weight loss 12/18/2017    Vascular dementia (HCC) 12/18/2017   Hearing loss 01/09/2016   Routine general medical examination at a health care facility 12/31/2015   Dysuria 09/29/2015   Anxiety and depression 09/21/2014   Pedal edema 06/22/2014   Encounter for Medicare annual wellness exam 03/18/2014   Retinal vein occlusion 03/18/2014   B12 deficiency 03/15/2014   Colon cancer screening 04/19/2013   At risk for falling 12/02/2011   Hyperparathyroidism (HCC) 11/20/2011   Back pain, thoracic 09/30/2011   Scoliosis 09/30/2011   Degenerative disc disease, lumbar 09/30/2011   Renal insufficiency 05/27/2011   Syncope 12/05/2010   Cerebral artery occlusion with cerebral infarction (HCC) 02/26/2008   Hyperlipidemia 05/12/2006   Essential hypertension 05/12/2006   DIVERTICULOSIS, COLON 05/12/2006   Osteopenia 05/12/2006    Past Surgical History:  Procedure Laterality Date   ABDOMINAL HYSTERECTOMY     carotid doppler     normal   DOPPLER ECHOCARDIOGRAPHY     normal   EXPLORATORY LAPAROTOMY     2 times both neg    LAMINECTOMY  06-2003   l2-l4   SPINE SURGERY     l3-l4 microdiscectomy    TONSILLECTOMY      Prior to Admission medications   Medication Sig Start Date End Date Taking? Authorizing Provider  acetaminophen (TYLENOL) 325 MG tablet Take 650 mg by mouth every  6 (six) hours as needed.   Yes [provider]  amLODipine (NORVASC) 5 MG tablet Take 5 mg by mouth daily.   Yes [provider]  atorvastatin (LIPITOR) 40 MG tablet Take 1 tablet (40 mg total) by mouth at bedtime. 02/25/17 09/04/20 Yes Tower, Audrie Gallus, MD  Cholecalciferol (VITAMIN D PO) Take 2,000 Units by mouth daily.   Yes [provider]  losartan (COZAAR) 100 MG tablet TAKE 1/2 TABLET (50 MG TOTAL) BY MOUTH DAILY. Patient taking differently: Take 100 mg by mouth daily. 02/25/17  Yes Tower, Audrie Gallus, MD  Multiple Vitamin (MULTIVITAMIN ADULT) TABS Take 1 tablet by mouth daily.   Yes [provider]  sertraline  (ZOLOFT) 100 MG tablet Take 1 tablet (100 mg total) by mouth daily. 07/19/19  Yes Tower, Audrie Gallus, MD  cephALEXin (KEFLEX) 250 MG capsule Take 1 capsule (250 mg total) by mouth 3 (three) times daily. 07/20/20   Irean Hong, MD    Allergies Celecoxib, Codeine, Lisinopril, and Etodolac  Family History  Problem Relation Age of Onset   Hypertension Father    COPD Father    Other Mother        Deceased, 51   Healthy Brother     Social History Social History   Tobacco Use   Smoking status: Never   Smokeless tobacco: Never  Vaping Use   Vaping Use: Never used  Substance Use Topics   Alcohol use: No    Alcohol/week: 0.0 standard drinks   Drug use: No    Review of Systems EM caveat, felt unreliable patient unable to recall some elements Eyes: No problems seeing ENT: No neck pain Cardiovascular: Denies chest pain. Respiratory: Denies shortness of breath. Gastrointestinal: No abdominal pain.   Genitourinary: Negative for dysuria. (Dried urine odor is mild however) Musculoskeletal: See HPI.  Denies any injury to the arms or legs Skin: Negative for rash. Neurological: Negative for headaches.    ____________________________________________   PHYSICAL EXAM:  VITAL SIGNS: ED Triage Vitals  Enc Vitals Group     BP 09/04/20 0726 (!) 188/105     Pulse Rate 09/04/20 0726 (!) 56     Resp 09/04/20 0726 18     Temp 09/04/20 0726 97.8 F (36.6 C)     Temp Source 09/04/20 0726 Oral     SpO2 09/04/20 0726 100 %     Weight 09/04/20 0722 119 lb 14.9 oz (54.4 kg)     Height 09/04/20 0722 5\' 3"  (1.6 m)     Head Circumference --      Peak Flow --      Pain Score --      Pain Loc --      Pain Edu? --      Excl. in GC? --     Constitutional: Alert and oriented. Well appearing and in no acute distress. Eyes: Conjunctivae are normal. Head: Atraumatic. Nose: No congestion/rhinnorhea. Mouth/Throat: Mucous membranes are moist. Neck: No stridor.  No cervical tenderness.  Ranges the  neck without obvious pain or discomfort. Cardiovascular: Normal rate, regular rhythm. Grossly normal heart sounds.  Good peripheral circulation. Respiratory: Normal respiratory effort.  No retractions. Lungs CTAB. Gastrointestinal: Soft and nontender. No distention. Musculoskeletal: No lower extremity tenderness nor edema.  She moves all of her extremities well.  She tends to keep the left shoulder reach behind her towards her lower back, I think this is secondary to trying to support her lower back.  She does not appear to  have any obvious bony tenderness bruising or deformity of any of her extremities including bilateral upper or lower.  She ranges the hips well without discomfort or pain.  Ranges upper extremities well without discomfort or pain.  Patient logrolled with assistance of nurse, reports moderate midline thoracic and low thoracic to high lumbar tenderness.  No obvious step-off though what appears to be some sort of a surgical scar or deformity is present midline Neurologic:  Normal speech and language. No gross focal neurologic deficits are appreciated.  Skin:  Skin is warm, dry and intact. No rash noted. Psychiatric: Mood and affect are normal. Speech and behavior are normal.  ____________________________________________   LABS (all labs ordered are listed, but only abnormal results are displayed)  Labs Reviewed  BASIC METABOLIC PANEL - Abnormal; Notable for the following components:      Result Value   Glucose, Bld 111 (*)    BUN 24 (*)    All other components within normal limits  CBC - Abnormal; Notable for the following components:   RBC 3.69 (*)    MCH 34.1 (*)    All other components within normal limits  URINALYSIS, COMPLETE (UACMP) WITH MICROSCOPIC - Abnormal; Notable for the following components:   Color, Urine YELLOW (*)    APPearance HAZY (*)    Ketones, ur 20 (*)    Protein, ur 30 (*)    All other components within normal limits  RESP PANEL BY RT-PCR (FLU A&B,  COVID) ARPGX2  CK   ____________________________________________  EKG  Reviewed inter by me at 739 heart rate 69 QRS 89 QTc 400 Normal sinus rhythm, minimal nonspecific T wave abnormality.  No evidence of acute ischemia  CT head reviewed negative for acute finding.  CT cervical spine demonstrates stable findings ____________________________________________  RADIOLOGY  CT Head Wo Contrast  Result Date: 09/04/2020 CLINICAL DATA:  Unwitnessed fall EXAM: CT HEAD WITHOUT CONTRAST TECHNIQUE: Contiguous axial images were obtained from the base of the skull through the vertex without intravenous contrast. COMPARISON:  07/19/2020 FINDINGS: Brain: There is atrophy and chronic small vessel disease changes. No acute intracranial abnormality. Specifically, no hemorrhage, hydrocephalus, mass lesion, acute infarction, or significant intracranial injury. Vascular: No hyperdense vessel or unexpected calcification. Skull: No acute calvarial abnormality. Sinuses/Orbits: No acute findings Other: None IMPRESSION: Atrophy, chronic microvascular disease. No acute intracranial abnormality. Electronically Signed   By: Charlett Nose M.D.   On: 09/04/2020 09:39   CT Cervical Spine Wo Contrast  Result Date: 09/04/2020 CLINICAL DATA:  Fall EXAM: CT CERVICAL SPINE WITHOUT CONTRAST TECHNIQUE: Multidetector CT imaging of the cervical spine was performed without intravenous contrast. Multiplanar CT image reconstructions were also generated. COMPARISON:  None. FINDINGS: Alignment: Continued 4 mm of anterolisthesis of C3 on C4 and C4 on C5 related to degenerative changes. Skull base and vertebrae: Again noted is the chronic type 2 odontoid fracture with nonunion. 2 mm of distraction and 3 mm of posterior displacement of the odontoid process relative to the C2 vertebral body. Fusion of the anterior arch of C1 with the odontoid. No acute fracture. Soft tissues and spinal canal: No prevertebral fluid or swelling. No visible canal  hematoma. Disc levels: Diffuse degenerative disc disease. Advanced bilateral degenerative facet disease. 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 Displacement and posterior subluxation of the odontoid process and C1 relative to C2. Appearance is stable since prior study. Advanced degenerative disc and facet disease. No acute bony abnormality. Electronically Signed  By: Charlett NoseKevin  Dover M.D.   On: 09/04/2020 09:45   CT Thoracic Spine Wo Contrast  Result Date: 09/04/2020 CLINICAL DATA:  Status post fall. Patient found down. Initial encounter. EXAM: CT THORACIC SPINE WITHOUT CONTRAST TECHNIQUE: Multidetector CT images of the thoracic were obtained using the standard protocol without intravenous contrast. COMPARISON:  Plain films lumbar spine 04/05/2018. FINDINGS: Alignment: No listhesis. There is some exaggeration of the normal thoracic kyphosis. Vertebrae: No acute fracture or focal pathologic process. Remote T12 compression fracture was present on the prior exam. Bones appear osteopenic. Paraspinal and other soft tissues: Aortic atherosclerosis. Cardiomegaly moderate hiatal hernia. There is some dependent atelectasis. Disc levels: There is mild bony retropulsion off the superior endplate of T12 causing mild spinal stenosis. Vacuum disc phenomenon is seen at T12-L1. Intervertebral disc spaces are otherwise unremarkable IMPRESSION: No acute abnormality. Remote T12 compression fracture. Electronically Signed   By: Drusilla Kannerhomas  Dalessio M.D.   On: 09/04/2020 12:12   CT Lumbar Spine Wo Contrast  Result Date: 09/04/2020 CLINICAL DATA:  Status post fall. Patient found down. Initial encounter. EXAM: CT LUMBAR SPINE WITHOUT CONTRAST TECHNIQUE: Multidetector CT imaging of the lumbar spine was performed without intravenous contrast administration. Multiplanar CT image reconstructions were also generated. COMPARISON:  Plain films lumbar spine 04/05/2018. FINDINGS:  Segmentation: Standard. Alignment: No listhesis. Severe convex right scoliosis with the apex at L3-4. Vertebrae: No acute fracture or focal pathologic process. Remote T12 compression fracture noted. Paraspinal and other soft tissues: Small calcifications in the kidneys may be vascular. No hydronephrosis. Aortic atherosclerosis. Disc levels: Loss of disc space height and vacuum disc phenomenon are seen at L1-2 and L2-3. Autologous fusion across the L3-4 and L4-5 disc interspaces is noted. IMPRESSION: No acute abnormality. Remote T12 compression fracture. Severe convex right scoliosis. Hiatal hernia. Aortic Atherosclerosis (ICD10-I70.0). Electronically Signed   By: Drusilla Kannerhomas  Dalessio M.D.   On: 09/04/2020 12:17   DG Humerus Left  Result Date: 09/04/2020 CLINICAL DATA:  Left arm pain after fall today. EXAM: LEFT HUMERUS - 2+ VIEW COMPARISON:  None. FINDINGS: There is no evidence of fracture or other focal bone lesions. Soft tissues are unremarkable. IMPRESSION: Negative. Electronically Signed   By: Lupita RaiderJames  Green Jr M.D.   On: 09/04/2020 12:51     Imaging reviewed including imaging of the left humerus negative for fracture.  CT thoracic and lumbar spine note remote compression fracture with notable right-sided scoliosis.  No acute finding is noted to suggest acute spinal fracture ____________________________________________   PROCEDURES  Procedure(s) performed: None  Procedures  Critical Care performed: No  ____________________________________________   INITIAL IMPRESSION / ASSESSMENT AND PLAN / ED COURSE  Pertinent labs & imaging results that were available during my care of the patient were reviewed by me and considered in my medical decision making (see chart for details).   Patient presents with an unwitnessed fall from her care facility.  She seems to have notable pain that is located focally along her mid to lower back.  Imaging studies including CT however do not denote acute finding though  notable old degenerative finding and old cervical injury are noted.  No associated neck pain.  She is alert but disoriented.  Pain control is challenging as Tylenol administered and fentanyl very small dose given the patient resting becomes somnolent thereafter, but when awoken she continues to do note pain at the same time wishes to be up utilizing her walker but is clearly not able to do so secondary to discomfort.  No evidence of  acute cardiac or pulmonary etiology.  Normal vital signs nontoxic in appearance.  Clinical Course as of 09/04/20 1626  Mon Sep 04, 2020  1256 Patient's work-up reviewed to this point very reassuring.  No evidence of acute medical etiology.  No evidence of acute bony or major traumatic injury.  CT imaging negative for acute findings, chronic cervical fracture without change.  Additionally imaging of the lumbar and thoracic do not demonstrate acute fracture.  Urinalysis clean without evidence of acute infection.  CBC reassuring as well as BMP. [MQ]  1257 Patient is currently resting, she is requesting to move about actually wants to try to sit in a wheelchair, and reports her pain is improved or at least under control.  Plan to discharge back to her care facility but wish to get her up to wheelchair.  No evidence and no complaint of pain in her extremities hips or pelvis. [MQ]  1418 Called to listed emergency contact patient family (T Normal) - voicemail, no answer. HIPAA compliant message left to return call.  [MQ]    Clinical Course User Index [MQ] Sharyn Creamer, MD   Nurse was able to make contact with patient's family who reports that the patient evidently had not been doing well and was laying primarily on her side in bed the last couple of days as well.  The patient is unable to ambulate to her baseline pain control an issue in the etiology of her low back pain still remains somewhat unclear, have discussed the case with the hospitalist.  Hospitalist recommend MRI lumbar  spine which I have ordered, and patient will be admitted for further care and management.  Attempted to reach the patient's listed contact I was unable to do so and have not heard back on voicemail as at this time for 20 p.m.  ____________________________________________   FINAL CLINICAL IMPRESSION(S) / ED DIAGNOSES  Final diagnoses:  Fall, initial encounter  Acute midline low back pain, unspecified whether sciatica present        Note:  This document was prepared using Dragon voice recognition software and may include unintentional dictation errors       Sharyn Creamer, MD 09/04/20 1626

## 2020-09-04 NOTE — ED Triage Notes (Signed)
Pt comes into the ED via [facility] from [facility] where she had an unwitnessed fall.  Unknown downtime prior to being found.  Pt has dementia at baseline.  Pt c/o left shoulder pain and was found in the left fetal position.  Pt does have chronic back pain as well.  Per the facility, the patient is acting like she is in more pain than normal.

## 2020-09-04 NOTE — ED Notes (Signed)
This RN went to place lidocaine patch and pt refused patch and kept stating "I don't want to be here, I want to go home and I don't know what that is and I don't want it'. Pt kept swatting this RN's hand away when this RN tried to place lidocaine patch and to obtain a set of VSS.   Pt educated that Tammy was coming to pick pt up. Pt still tearful and disoriented to where she is and why she is here.   Pt also stating she is not agreeable to take any medications at this time and wont until she goes home.

## 2020-09-04 NOTE — ED Notes (Signed)
IVP fentanyl not given to pt currently resting comfortably, MD aware and medication wasted per protocol.  

## 2020-09-04 NOTE — ED Notes (Signed)
Waiting for Dr Para March to come down to sign the AMA paper. Dr Para March stated that she'll be down to sign the AMA form as soon as possible.  Stated that she was already informed by the attending

## 2020-09-04 NOTE — ED Notes (Signed)
Per MD he requests this RN to ambulate pt, pt appears to still be in pain and safety issues to walk without being able to provide this RN information on DME use and ambulation status.   This RN contacted POA, Tammy, who states pt does ambulate but with a  walker but reports the past few days to a week pt has not been getting out of bed and walking less and less which POA states could be the issue with ongoing increasing back pain.

## 2020-09-04 NOTE — ED Notes (Signed)
Pt given dinner tray and assisted with salad and cutting up chicken.

## 2020-09-04 NOTE — ED Notes (Signed)
See triage note. Pt is very HOH and with a baseline of dementia, pt is not able to prvide useful information at this time other than she has c/o of L lateral side pain. VSS.  Pt smells of strong urine at this time but is dry with no depends on.

## 2020-09-04 NOTE — ED Notes (Signed)
This RN received a phone call from pt's POA, Tammy who stated she wanted to come pick the pt up due to "inpatient stay will not benefit pt". Admitting MD aware via secure chat.

## 2020-09-05 ENCOUNTER — Emergency Department: Payer: Medicare Other

## 2020-09-05 ENCOUNTER — Inpatient Hospital Stay
Admission: EM | Admit: 2020-09-05 | Discharge: 2020-09-13 | DRG: 543 | Disposition: A | Discharge status: Hospice | Payer: Medicare Other | Source: Skilled Nursing Facility | Attending: Hospitalist | Admitting: Hospitalist

## 2020-09-05 ENCOUNTER — Encounter: Payer: Self-pay | Admitting: *Deleted

## 2020-09-05 ENCOUNTER — Other Ambulatory Visit: Payer: Self-pay

## 2020-09-05 DIAGNOSIS — M5136 Other intervertebral disc degeneration, lumbar region: Secondary | ICD-10-CM | POA: Diagnosis present

## 2020-09-05 DIAGNOSIS — W19XXXA Unspecified fall, initial encounter: Secondary | ICD-10-CM | POA: Diagnosis present

## 2020-09-05 DIAGNOSIS — Z66 Do not resuscitate: Secondary | ICD-10-CM | POA: Diagnosis present

## 2020-09-05 DIAGNOSIS — F32A Depression, unspecified: Secondary | ICD-10-CM | POA: Diagnosis present

## 2020-09-05 DIAGNOSIS — Z681 Body mass index (BMI) 19 or less, adult: Secondary | ICD-10-CM

## 2020-09-05 DIAGNOSIS — I10 Essential (primary) hypertension: Secondary | ICD-10-CM | POA: Diagnosis present

## 2020-09-05 DIAGNOSIS — M8468XA Pathological fracture in other disease, other site, initial encounter for fracture: Secondary | ICD-10-CM | POA: Diagnosis present

## 2020-09-05 DIAGNOSIS — E785 Hyperlipidemia, unspecified: Secondary | ICD-10-CM | POA: Diagnosis present

## 2020-09-05 DIAGNOSIS — Z79899 Other long term (current) drug therapy: Secondary | ICD-10-CM

## 2020-09-05 DIAGNOSIS — M25512 Pain in left shoulder: Secondary | ICD-10-CM | POA: Diagnosis present

## 2020-09-05 DIAGNOSIS — M545 Low back pain, unspecified: Secondary | ICD-10-CM

## 2020-09-05 DIAGNOSIS — R296 Repeated falls: Secondary | ICD-10-CM | POA: Diagnosis not present

## 2020-09-05 DIAGNOSIS — G8929 Other chronic pain: Secondary | ICD-10-CM | POA: Diagnosis present

## 2020-09-05 DIAGNOSIS — M1612 Unilateral primary osteoarthritis, left hip: Secondary | ICD-10-CM | POA: Diagnosis present

## 2020-09-05 DIAGNOSIS — Y92129 Unspecified place in nursing home as the place of occurrence of the external cause: Secondary | ICD-10-CM

## 2020-09-05 DIAGNOSIS — R2689 Other abnormalities of gait and mobility: Secondary | ICD-10-CM

## 2020-09-05 DIAGNOSIS — R2681 Unsteadiness on feet: Secondary | ICD-10-CM | POA: Diagnosis not present

## 2020-09-05 DIAGNOSIS — M8448XD Pathological fracture, other site, subsequent encounter for fracture with routine healing: Secondary | ICD-10-CM | POA: Diagnosis not present

## 2020-09-05 DIAGNOSIS — F0150 Vascular dementia without behavioral disturbance: Secondary | ICD-10-CM | POA: Diagnosis present

## 2020-09-05 DIAGNOSIS — M8448XA Pathological fracture, other site, initial encounter for fracture: Secondary | ICD-10-CM

## 2020-09-05 DIAGNOSIS — M62838 Other muscle spasm: Secondary | ICD-10-CM | POA: Diagnosis not present

## 2020-09-05 DIAGNOSIS — Z8673 Personal history of transient ischemic attack (TIA), and cerebral infarction without residual deficits: Secondary | ICD-10-CM

## 2020-09-05 DIAGNOSIS — Z7189 Other specified counseling: Secondary | ICD-10-CM | POA: Diagnosis not present

## 2020-09-05 DIAGNOSIS — Z20822 Contact with and (suspected) exposure to covid-19: Secondary | ICD-10-CM | POA: Diagnosis present

## 2020-09-05 DIAGNOSIS — R627 Adult failure to thrive: Secondary | ICD-10-CM | POA: Diagnosis present

## 2020-09-05 DIAGNOSIS — Z8249 Family history of ischemic heart disease and other diseases of the circulatory system: Secondary | ICD-10-CM | POA: Diagnosis not present

## 2020-09-05 DIAGNOSIS — R001 Bradycardia, unspecified: Secondary | ICD-10-CM | POA: Diagnosis present

## 2020-09-05 DIAGNOSIS — R262 Difficulty in walking, not elsewhere classified: Secondary | ICD-10-CM

## 2020-09-05 DIAGNOSIS — Z825 Family history of asthma and other chronic lower respiratory diseases: Secondary | ICD-10-CM

## 2020-09-05 DIAGNOSIS — M25552 Pain in left hip: Secondary | ICD-10-CM | POA: Diagnosis not present

## 2020-09-05 DIAGNOSIS — E440 Moderate protein-calorie malnutrition: Secondary | ICD-10-CM | POA: Diagnosis present

## 2020-09-05 DIAGNOSIS — S22080S Wedge compression fracture of T11-T12 vertebra, sequela: Secondary | ICD-10-CM | POA: Diagnosis not present

## 2020-09-05 DIAGNOSIS — Z515 Encounter for palliative care: Secondary | ICD-10-CM | POA: Diagnosis not present

## 2020-09-05 DIAGNOSIS — M8580 Other specified disorders of bone density and structure, unspecified site: Secondary | ICD-10-CM | POA: Diagnosis present

## 2020-09-05 DIAGNOSIS — M419 Scoliosis, unspecified: Secondary | ICD-10-CM | POA: Diagnosis present

## 2020-09-05 MED ORDER — ONDANSETRON HCL 4 MG PO TABS: 4.0000 mg | ORAL_TABLET | Freq: Four times a day (QID) | ORAL | Status: DC | PRN

## 2020-09-05 MED ORDER — ACETAMINOPHEN 650 MG RE SUPP: 650.0000 mg | Freq: Four times a day (QID) | RECTAL | Status: DC | PRN

## 2020-09-05 MED ORDER — LOSARTAN POTASSIUM 50 MG PO TABS
100.0000 mg | ORAL_TABLET | Freq: Every day | ORAL | Status: DC
  Administered 2020-09-06 – 2020-09-08 (×3): 100 mg via ORAL
  Filled 2020-09-05 (×3): qty 2

## 2020-09-05 MED ORDER — ONDANSETRON HCL 4 MG/2ML IJ SOLN: 4.0000 mg | Freq: Four times a day (QID) | INTRAMUSCULAR | Status: DC | PRN

## 2020-09-05 MED ORDER — MORPHINE SULFATE (PF) 2 MG/ML IV SOLN
0.5000 mg | INTRAVENOUS | Status: DC | PRN
  Administered 2020-09-05 – 2020-09-07 (×2): 0.5 mg via INTRAVENOUS
  Filled 2020-09-05 (×2): qty 1

## 2020-09-05 MED ORDER — ENOXAPARIN SODIUM 40 MG/0.4ML IJ SOSY
40.0000 mg | PREFILLED_SYRINGE | INTRAMUSCULAR | Status: DC
  Administered 2020-09-05 – 2020-09-06 (×2): 40 mg via SUBCUTANEOUS
  Filled 2020-09-05 (×2): qty 0.4

## 2020-09-05 MED ORDER — SENNOSIDES-DOCUSATE SODIUM 8.6-50 MG PO TABS
1.0000 | ORAL_TABLET | Freq: Every evening | ORAL | Status: DC | PRN
  Administered 2020-09-11: 1 via ORAL
  Filled 2020-09-05: qty 1

## 2020-09-05 MED ORDER — HYDROCODONE-ACETAMINOPHEN 5-325 MG PO TABS
1.0000 | ORAL_TABLET | Freq: Four times a day (QID) | ORAL | Status: DC | PRN
  Administered 2020-09-06: 2 via ORAL
  Administered 2020-09-07: 1 via ORAL
  Administered 2020-09-07 (×2): 2 via ORAL
  Filled 2020-09-05 (×2): qty 2
  Filled 2020-09-05: qty 1
  Filled 2020-09-05: qty 2

## 2020-09-05 MED ORDER — ACETAMINOPHEN 325 MG PO TABS
650.0000 mg | ORAL_TABLET | Freq: Four times a day (QID) | ORAL | Status: DC | PRN
  Administered 2020-09-06: 650 mg via ORAL
  Filled 2020-09-05: qty 2

## 2020-09-05 MED ORDER — ATORVASTATIN CALCIUM 20 MG PO TABS
40.0000 mg | ORAL_TABLET | Freq: Every day | ORAL | Status: DC
  Administered 2020-09-06: 40 mg via ORAL
  Filled 2020-09-05 (×2): qty 2

## 2020-09-05 NOTE — H&P (Signed)
History and Physical    [name] WUJ:[id number] DOB: May 03, 1930 DOA: 09/05/2020  PCP: Judy Pimpleower, Marne A, MD   [facility] [place] [facility] [facility]  I have personally briefly reviewed patient's old medical records in [facility]  Chief Complaint: unable to weight bear following fall  HPI: [name] is a 87 y.o. female with medical history significant for Dementia, HTN, osteopenia and frequent falls who presents to the ED for the second time in 24 hours with a complaint of inability to ambulate/bear weight on her left extremity following a fall at her skilled nursing facility on 7/24.  Patient was initially evaluated in the emergency room on 7/25 and had extensive imaging with CT thoracolumbar spine after she complained of low back pain following a fall.  She was admitted to observation but was signed out AGAINST MEDICAL ADVICE by her niece later on that evening as patient had voiced no additional complaint.  [facility] was [facility] however, it was noted that patient was still having difficulty weightbearing and there was still suspicion for acute injury from her recent fall so she was sent back to the ED.  Patient continued to have intermittent complaints of low back pain.  There were no reported complaints of difficulty with bladder or bowel control and she was able to move her legs independently once lying down but just unable to weight-bear.  ED course: On arrival, vitals within normal limits, urinalysis unremarkable.  Blood work which was unremarkable the day prior was not repeated.  COVID and flu negative  EKG, personally viewed and interpreted sinus bradycardia at 58 with nonspecific ST-T wave changes  Imaging: 7/25 CT thoracolumbar/cervical and CT head with no acute findings 7/26 left humerus left and right hip and pelvis with no acute findings  Hospitalist consulted for admission.  CT pelvis requested which shows 7/26 CT pelvis requested shows acute/subacute  right sacral insufficiency fracture and possible left sacral insufficiency fracture  Patient will be admitted for pain control and PT with Ortho evaluation.  Review of Systems: Unable to obtain due to dementia  Past Medical History:  Diagnosis Date   Chronic pain    DDD (degenerative disc disease)    Diverticulosis    Hyperlipidemia    Hypertension    Osteopenia    Stroke (HCC)    Syncope    Vascular dementia (HCC)     Past Surgical History:  Procedure Laterality Date   ABDOMINAL HYSTERECTOMY     carotid doppler     normal   DOPPLER ECHOCARDIOGRAPHY     normal   EXPLORATORY LAPAROTOMY     2 times both neg    LAMINECTOMY  06-2003   l2-l4   SPINE SURGERY     l3-l4 microdiscectomy    TONSILLECTOMY       reports that she has never smoked. She has never used smokeless tobacco. She reports that she does not drink alcohol and does not use drugs.  Allergies  Allergen Reactions   Celecoxib Other (See Comments)    affects kidneys   Codeine Nausea Only and Other (See Comments)    dizziness   Lisinopril Cough   Etodolac Rash    Family History  Problem Relation Age of Onset   Hypertension Father    COPD Father    Other Mother        Deceased, [id number]   Healthy Brother       Prior to Admission medications   Medication Sig Start  Date End Date Taking? Authorizing Provider  acetaminophen (TYLENOL) 325 MG tablet Take 650 mg by mouth every 6 (six) hours as needed.    [provider]  amLODipine (NORVASC) 5 MG tablet Take 5 mg by mouth daily.    [provider]  atorvastatin (LIPITOR) 40 MG tablet Take 1 tablet (40 mg total) by mouth at bedtime. 02/25/17 09/04/20  Tower, Audrie Gallus, MD  cephALEXin (KEFLEX) 250 MG capsule Take 1 capsule (250 mg total) by mouth 3 (three) times daily. 07/20/20   Irean Hong, MD  Cholecalciferol (VITAMIN D PO) Take 2,000 Units by mouth daily.    [provider]  losartan (COZAAR) 100 MG tablet TAKE 1/2 TABLET (50 MG TOTAL) BY  MOUTH DAILY. Patient taking differently: Take 100 mg by mouth daily. 02/25/17   Tower, Audrie Gallus, MD  Multiple Vitamin (MULTIVITAMIN ADULT) TABS Take 1 tablet by mouth daily.    [provider]  sertraline (ZOLOFT) 100 MG tablet Take 1 tablet (100 mg total) by mouth daily. 07/19/19   Judy Pimple, MD    Physical Exam: Vitals:   09/05/20 1516 09/05/20 1518 09/05/20 1523 09/05/20 1825  BP: (!) 131/91   (!) 148/95  Pulse: 85   71  Resp: 18   16  Temp: 98.6 F (37 C)     TempSrc: Oral     SpO2: 97%   98%  Weight:  54 kg 54 kg   Height:  5\' 3"  (1.6 m) 5\' 3"  (1.6 m)      Vitals:   09/05/20 1516 09/05/20 1518 09/05/20 1523 09/05/20 1825  BP: (!) 131/91   (!) 148/95  Pulse: 85   71  Resp: 18   16  Temp: 98.6 F (37 C)     TempSrc: Oral     SpO2: 97%   98%  Weight:  54 kg 54 kg   Height:  5\' 3"  (1.6 m) 5\' 3"  (1.6 m)       Constitutional: Alert, pleasantly confused and oriented to person only . Not in any apparent distress HEENT:      Head: Normocephalic and atraumatic.         Eyes: PERLA, EOMI, Conjunctivae are normal. Sclera is non-icteric.       Mouth/Throat: Mucous membranes are moist.       Neck: Supple with no signs of meningismus. Cardiovascular: Regular rate and rhythm. No murmurs, gallops, or rubs. 2+ symmetrical distal pulses are present . No JVD. No LE edema Respiratory: Respiratory effort normal .Lungs sounds clear bilaterally. No wheezes, crackles, or rhonchi.  Gastrointestinal: Soft, non tender, and non distended with positive bowel sounds.  Genitourinary: No CVA tenderness. Musculoskeletal: Nontender with normal range of motion in all extremities. No cyanosis, or erythema of extremities. Neurologic:  Face is symmetric. Moving all extremities. No gross focal neurologic deficits . Skin: Skin is warm, dry.  No rash or ulcers Psychiatric: Mood and affect are normal    Labs on Admission: I have personally reviewed following labs and imaging  studies  CBC: Recent Labs  Lab 09/04/20 0726  WBC 7.4  HGB 12.6  HCT 36.7  MCV 99.5  PLT 160   Basic Metabolic Panel: Recent Labs  Lab 09/04/20 0726  NA 140  K 3.6  CL 105  CO2 26  GLUCOSE 111*  BUN 24*  CREATININE 0.82  CALCIUM 10.2   GFR: Estimated Creatinine Clearance: 39.2 mL/min (by C-G formula based on SCr of 0.82 mg/dL). Liver Function Tests:  No results for input(s): AST, ALT, ALKPHOS, BILITOT, PROT, ALBUMIN in the last 168 hours. No results for input(s): LIPASE, AMYLASE in the last 168 hours. No results for input(s): AMMONIA in the last 168 hours. Coagulation Profile: No results for input(s): INR, PROTIME in the last 168 hours. Cardiac Enzymes: Recent Labs  Lab 09/04/20 0726  CKTOTAL 64   BNP (last 3 results) No results for input(s): PROBNP in the last 8760 hours. HbA1C: No results for input(s): HGBA1C in the last 72 hours. CBG: No results for input(s): GLUCAP in the last 168 hours. Lipid Profile: No results for input(s): CHOL, HDL, LDLCALC, TRIG, CHOLHDL, LDLDIRECT in the last 72 hours. Thyroid Function Tests: No results for input(s): TSH, T4TOTAL, FREET4, T3FREE, THYROIDAB in the last 72 hours. Anemia Panel: No results for input(s): VITAMINB12, FOLATE, FERRITIN, TIBC, IRON, RETICCTPCT in the last 72 hours. Urine analysis:    Component Value Date/Time   COLORURINE YELLOW (A) 09/04/2020 1135   APPEARANCEUR HAZY (A) 09/04/2020 1135   LABSPEC 1.023 09/04/2020 1135   PHURINE 5.0 09/04/2020 1135   GLUCOSEU NEGATIVE 09/04/2020 1135   HGBUR NEGATIVE 09/04/2020 1135   HGBUR trace-intact 02/08/2008 1152   BILIRUBINUR NEGATIVE 09/04/2020 1135   BILIRUBINUR Negative 09/29/2015 1157   KETONESUR 20 (A) 09/04/2020 1135   PROTEINUR 30 (A) 09/04/2020 1135   UROBILINOGEN 0.2 09/29/2015 1157   UROBILINOGEN 0.2 11/13/2014 2040   NITRITE NEGATIVE 09/04/2020 1135   LEUKOCYTESUR NEGATIVE 09/04/2020 1135    Radiological Exams on Admission: CT Head Wo  Contrast  Result Date: 09/04/2020 CLINICAL DATA:  Unwitnessed fall EXAM: CT HEAD WITHOUT CONTRAST TECHNIQUE: Contiguous axial images were obtained from the base of the skull through the vertex without intravenous contrast. COMPARISON:  07/19/2020 FINDINGS: Brain: There is atrophy and chronic small vessel disease changes. No acute intracranial abnormality. Specifically, no hemorrhage, hydrocephalus, mass lesion, acute infarction, or significant intracranial injury. Vascular: No hyperdense vessel or unexpected calcification. Skull: No acute calvarial abnormality. Sinuses/Orbits: No acute findings Other: None IMPRESSION: Atrophy, chronic microvascular disease. No acute intracranial abnormality. Electronically Signed   By: Charlett Nose M.D.   On: 09/04/2020 09:39   CT Cervical Spine Wo Contrast  Result Date: 09/04/2020 CLINICAL DATA:  Fall EXAM: CT CERVICAL SPINE WITHOUT CONTRAST TECHNIQUE: Multidetector CT imaging of the cervical spine was performed without intravenous contrast. Multiplanar CT image reconstructions were also generated. COMPARISON:  None. FINDINGS: Alignment: Continued 4 mm of anterolisthesis of C3 on C4 and C4 on C5 related to degenerative changes. Skull base and vertebrae: Again noted is the chronic type 2 odontoid fracture with nonunion. 2 mm of distraction and 3 mm of posterior displacement of the odontoid process relative to the C2 vertebral body. Fusion of the anterior arch of C1 with the odontoid. No acute fracture. Soft tissues and spinal canal: No prevertebral fluid or swelling. No visible canal hematoma. Disc levels: Diffuse degenerative disc disease. Advanced bilateral degenerative facet disease. 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 Displacement and posterior subluxation of the odontoid process and C1 relative to C2. Appearance is stable since prior study. Advanced degenerative disc and facet disease. No acute bony  abnormality. Electronically Signed   By: Charlett Nose M.D.   On: 09/04/2020 09:45   CT Thoracic Spine Wo Contrast  Result Date: 09/04/2020 CLINICAL DATA:  Status post fall. Patient found down. Initial encounter. EXAM: CT THORACIC SPINE WITHOUT CONTRAST TECHNIQUE: Multidetector CT images of the thoracic were obtained using the  standard protocol without intravenous contrast. COMPARISON:  Plain films lumbar spine 04/05/2018. FINDINGS: Alignment: No listhesis. There is some exaggeration of the normal thoracic kyphosis. Vertebrae: No acute fracture or focal pathologic process. Remote T12 compression fracture was present on the prior exam. Bones appear osteopenic. Paraspinal and other soft tissues: Aortic atherosclerosis. Cardiomegaly moderate hiatal hernia. There is some dependent atelectasis. Disc levels: There is mild bony retropulsion off the superior endplate of T12 causing mild spinal stenosis. Vacuum disc phenomenon is seen at T12-L1. Intervertebral disc spaces are otherwise unremarkable IMPRESSION: No acute abnormality. Remote T12 compression fracture. Electronically Signed   By: Drusilla Kanner M.D.   On: 09/04/2020 12:12   CT Lumbar Spine Wo Contrast  Result Date: 09/04/2020 CLINICAL DATA:  Status post fall. Patient found down. Initial encounter. EXAM: CT LUMBAR SPINE WITHOUT CONTRAST TECHNIQUE: Multidetector CT imaging of the lumbar spine was performed without intravenous contrast administration. Multiplanar CT image reconstructions were also generated. COMPARISON:  Plain films lumbar spine 04/05/2018. FINDINGS: Segmentation: Standard. Alignment: No listhesis. Severe convex right scoliosis with the apex at L3-4. Vertebrae: No acute fracture or focal pathologic process. Remote T12 compression fracture noted. Paraspinal and other soft tissues: Small calcifications in the kidneys may be vascular. No hydronephrosis. Aortic atherosclerosis. Disc levels: Loss of disc space height and vacuum disc phenomenon  are seen at L1-2 and L2-3. Autologous fusion across the L3-4 and L4-5 disc interspaces is noted. IMPRESSION: No acute abnormality. Remote T12 compression fracture. Severe convex right scoliosis. Hiatal hernia. Aortic Atherosclerosis (ICD10-I70.0). Electronically Signed   By: Drusilla Kanner M.D.   On: 09/04/2020 12:17   CT PELVIS WO CONTRAST  Result Date: 09/05/2020 CLINICAL DATA:  Fall, back pain left hip pain, nonweightbearing EXAM: CT PELVIS WITHOUT CONTRAST TECHNIQUE: Multidetector CT imaging of the pelvis was performed following the standard protocol without intravenous contrast. COMPARISON:  Oblique radiograph 08/19/2019, CT L-spine 09/04/2020 FINDINGS: Urinary Tract: Imaged portion of the lower pole left kidney and visible segments of the ureters are grossly unremarkable. Urinary bladder is free of acute abnormality accounting for degree of distension. Catch catheter noted medially. Bowel: No conspicuous large or small bowel thickening or dilatation. Normal air-filled appendix in the right lower quadrant. No evidence of bowel obstruction. Vascular/Lymphatic: Atherosclerotic calcifications within the distal abdominal aorta and branch vessels. No aneurysm or ectasia. No enlarged pelvic nodes lymph nodes. Reproductive: Prior hysterectomy. 1.4 cm cystic focus along the right pelvic sidewall, likely adnexal cyst. Other: Small amount of skin thickening and subcutaneous stranding superficial to the sacrum towards the superior gluteal cleft. Small amount of fluid within the umbilicus. No body wall hematoma. Musculoskeletal: Diffuse bony demineralization may limit detection of subtle or nondisplaced injuries. There is an acute to subacute right sacral insufficiency fracture with cortical step-off of the sacral ala towards the right SI joint (3/54) additional questionable band of sclerosis and lucency through the left sacral ala towards the SI joint could reflect a left insufficiency fracture as well. No other  acute traumatic osseous injury of the bony pelvis or proximal femora. Internal rotation of the left hip is noted. Background of discogenic and facet degenerative changes in the imaged lumbar spine. Pseudoarticulation between the L5 transverse processes and adjacent sacral ala, can be a pain generator if there are concordant clinical features Bertolotti syndrome. Levocurvature of the lumbar levels. Remote appearing lateral compression deformity of L4 with fusion of the L3-4 vertebral bodies on the left. Additional underlying SI joint and symphysis pubis arthrosis. Moderate bilateral hip osteoarthrosis as well.  Muscle bulk is slightly diminished but without gross atrophy or other acute or worrisome muscular abnormality. IMPRESSION: 1. Acute to subacute right sacral insufficiency fracture. Questionable left sacral insufficiency fracture as well. 2. No other acute traumatic osseous injury of the bony pelvis. 3. Remote appearing deformity of the L4 vertebral level with bony fusion of the L3-4 left vertebral body. 4. Degenerative changes of the spine, hips and pelvis as above. 5. Minimal skin thickening superficial to the right sacrum towards the superior margin of the gluteal cleft. Correlate with visual inspection. 6. Prior hysterectomy. 1.4 cm cystic structure in the left adnexa. Given the size of this focus, no follow-up imaging is recommended. Note: This recommendation does not to patients with increased risk (genetic, family history, elevated tumor markers or other high-risk factors) of ovarian cancer. Reference: JACR 2020 Feb; 17(2):248-254 7.  Aortic Atherosclerosis (ICD10-I70.0). Electronically Signed   By: Kreg Shropshire M.D.   On: 09/05/2020 20:37   DG Humerus Left  Result Date: 09/04/2020 CLINICAL DATA:  Left arm pain after fall today. EXAM: LEFT HUMERUS - 2+ VIEW COMPARISON:  None. FINDINGS: There is no evidence of fracture or other focal bone lesions. Soft tissues are unremarkable. IMPRESSION: Negative.  Electronically Signed   By: Lupita Raider M.D.   On: 09/04/2020 12:51   DG Hip Unilat W or Wo Pelvis 2-3 Views Left  Result Date: 09/05/2020 CLINICAL DATA:  Bilateral hip pain after multiple falls. EXAM: DG HIP (WITH OR WITHOUT PELVIS) 2-3V LEFT COMPARISON:  April 05, 2018. FINDINGS: There is no evidence of hip fracture or dislocation. Mild osteophyte formation is noted. IMPRESSION: Mild degenerative joint disease of the left hip. No acute abnormality seen. Electronically Signed   By: Lupita Raider M.D.   On: 09/05/2020 18:42   DG Hip Unilat W or Wo Pelvis 2-3 Views Right  Result Date: 09/05/2020 CLINICAL DATA:  Bilateral hip pain after recent falls. EXAM: DG HIP (WITH OR WITHOUT PELVIS) 2-3V RIGHT COMPARISON:  None. FINDINGS: There is no evidence of hip fracture or dislocation. There is no evidence of arthropathy or other focal bone abnormality. IMPRESSION: Negative. Electronically Signed   By: Lupita Raider M.D.   On: 09/05/2020 18:43     Assessment/Plan 87 year old female with history of dementia, HTN, osteopenia and frequent falls who presents to the ED for the second time in 24 hours with a complaint of inability to ambulate/bear weight on her left extremity following a fall at her skilled nursing facility on 7/24.    Bilateral sacral insufficiency fracture with difficulty weightbearing s/p fall on 7/24 Osteopenia/frequent falls -Patient admitted for pain control  -CT pelvis requested shows acute/subacute right sacral insufficiency fracture and possible left sacral insufficiency fracture - Consider Ortho consult in the a.m. for any additional recommendations - PT and TOC consults    Essential hypertension - Continue home meds    Vascular dementia (HCC) - Delirium precautions    DVT prophylaxis: Lovenox  Code Status: DNR  Family Communication:  none  Disposition Plan: Back to previous home environment Consults called: none  Status: Inpatient    Andris Baumann  MD [facility]     09/05/2020, 9:09 PM

## 2020-09-05 NOTE — ED Notes (Signed)
To imagining via stretcher

## 2020-09-05 NOTE — ED Provider Notes (Signed)
[facility] Emergency Department Provider Note  ____________________________________________   Event Date/Time   First MD Initiated Contact with Patient 09/05/20 1636     (approximate)  I have reviewed the triage vital signs and the nursing notes.   HISTORY  Chief Complaint Back Pain  Level 5 Caveat: Hx chronic dementia  HPI [name] is a 87 y.o. female who presents to the ER for evaluation of low back pain after a fall yesterday at her nursing facility.  This fall was unwitnessed in nature, was transferred here for work-up.  At that time, CTs were negative, however she continued to complain of significant low back pain.  She was admitted to the hospitalist who recommended MRI of the lumbar spine for further evaluation, however the patient's niece signed her out AMA, stating to me today that she thought they were only giving her physical therapy.  She took her back to her living facility without any paperwork, and the facility noted today that she is unable to bear weight on the left extremity, however she was previously able to do so prior to the fall yesterday.  They requested that she be reevaluated.  The patient has dementia at baseline, her niece reports that she is roughly at her baseline. The patient reports to me continued low back pain but denies pain elsewhere.        Past Medical History:  Diagnosis Date   Chronic pain    DDD (degenerative disc disease)    Diverticulosis    Hyperlipidemia    Hypertension    Osteopenia    Stroke [facility])    Syncope    Vascular dementia [facility])     Patient Active Problem List   Diagnosis Date Noted   Fall 09/04/2020   Frequent falls 09/04/2020   Chronic pain    Screening-pulmonary TB 05/20/2019   Cervical spine fracture (HCC) 12/18/2017   Weight loss 12/18/2017   Vascular dementia (HCC) 12/18/2017   Hearing loss 01/09/2016   Routine general medical examination at a health care facility 12/31/2015    Dysuria 09/29/2015   Anxiety and depression 09/21/2014   Pedal edema 06/22/2014   Encounter for Medicare annual wellness exam 03/18/2014   Retinal vein occlusion 03/18/2014   B12 deficiency 03/15/2014   Colon cancer screening 04/19/2013   At risk for falling 12/02/2011   Hyperparathyroidism (HCC) 11/20/2011   Back pain, thoracic 09/30/2011   Scoliosis 09/30/2011   Degenerative disc disease, lumbar 09/30/2011   Renal insufficiency 05/27/2011   Syncope 12/05/2010   Cerebral artery occlusion with cerebral infarction (HCC) 02/26/2008   Hyperlipidemia 05/12/2006   Essential hypertension 05/12/2006   DIVERTICULOSIS, COLON 05/12/2006   Osteopenia 05/12/2006    Past Surgical History:  Procedure Laterality Date   ABDOMINAL HYSTERECTOMY     carotid doppler     normal   DOPPLER ECHOCARDIOGRAPHY     normal   EXPLORATORY LAPAROTOMY     2 times both neg    LAMINECTOMY  06-2003   l2-l4   SPINE SURGERY     l3-l4 microdiscectomy    TONSILLECTOMY      Prior to Admission medications   Medication Sig Start Date End Date Taking? Authorizing Provider  acetaminophen (TYLENOL) 325 MG tablet Take 650 mg by mouth every 6 (six) hours as needed.    [provider]  amLODipine (NORVASC) 5 MG tablet Take 5 mg by mouth daily.    [provider]  atorvastatin (LIPITOR) 40 MG tablet Take 1  tablet (40 mg total) by mouth at bedtime. 02/25/17 09/04/20  Tower, Audrie Gallus, MD  cephALEXin (KEFLEX) 250 MG capsule Take 1 capsule (250 mg total) by mouth 3 (three) times daily. 07/20/20   Irean Hong, MD  Cholecalciferol (VITAMIN D PO) Take 2,000 Units by mouth daily.    [provider]  losartan (COZAAR) 100 MG tablet TAKE 1/2 TABLET (50 MG TOTAL) BY MOUTH DAILY. Patient taking differently: Take 100 mg by mouth daily. 02/25/17   Tower, Audrie Gallus, MD  Multiple Vitamin (MULTIVITAMIN ADULT) TABS Take 1 tablet by mouth daily.    [provider]  sertraline (ZOLOFT) 100 MG tablet Take 1  tablet (100 mg total) by mouth daily. 07/19/19   Tower, Audrie Gallus, MD    Allergies Celecoxib, Codeine, Lisinopril, and Etodolac  Family History  Problem Relation Age of Onset   Hypertension Father    COPD Father    Other Mother        Deceased, 27   Healthy Brother     Social History Social History   Tobacco Use   Smoking status: Never   Smokeless tobacco: Never  Vaping Use   Vaping Use: Never used  Substance Use Topics   Alcohol use: No    Alcohol/week: 0.0 standard drinks   Drug use: No    Review of Systems Constitutional: No fever/chills Eyes: No visual changes. ENT: No sore throat. Cardiovascular: Denies chest pain. Respiratory: Denies shortness of breath. Gastrointestinal: No abdominal pain.  No nausea, no vomiting.  No diarrhea.  No constipation. Genitourinary: Negative for dysuria. Musculoskeletal: + back pain, +difficulty weight bearing Skin: Negative for rash. Neurological: Negative for headaches, focal weakness or numbness.  ____________________________________________   PHYSICAL EXAM:  VITAL SIGNS: ED Triage Vitals  Enc Vitals Group     BP 09/05/20 1516 (!) 131/91     Pulse Rate 09/05/20 1516 85     Resp 09/05/20 1516 18     Temp 09/05/20 1516 98.6 F (37 C)     Temp Source 09/05/20 1516 Oral     SpO2 09/05/20 1516 97 %     Weight 09/05/20 1518 119 lb (54 kg)     Height 09/05/20 1518 5\' 3"  (1.6 m)     Head Circumference --      Peak Flow --      Pain Score 09/05/20 1522 9     Pain Loc --      Pain Edu? --      Excl. in GC? --    Constitutional: Alert, oriented to self only. Chronically ill appearing, in no acute distress. Eyes: Conjunctivae are normal. PERRL. EOMI. Head: Atraumatic. Nose: No congestion/rhinnorhea. Mouth/Throat: Mucous membranes are moist.  Oropharynx non-erythematous. Neck: No stridor. No tenderness to palpation of the midline or paraspinals of the cervical spine. Cardiovascular: Normal rate, regular rhythm. Grossly normal  heart sounds.  Good peripheral circulation. Respiratory: Normal respiratory effort.  No retractions. Lungs CTAB. Gastrointestinal: Soft and nontender. No distention. No abdominal bruits. No CVA tenderness. Musculoskeletal: No tenderness to palpation of the bilateral lower extremities. No deformity noted. Negative log roll of the bilateral legs.  There is diffuse tenderness of the lumbar spine and paraspinal region. Neurologic:  Normal speech and language. No gross focal neurologic deficits are appreciated.  Skin:  Skin is warm, dry and intact. No rash noted. Psychiatric: Mood and affect are normal. Speech and behavior are normal.   ____________________________________________  RADIOLOGY I, 09/07/20, personally viewed and evaluated these  images (plain radiographs) as part of my medical decision making, as well as reviewing the written report by the radiologist.  ED provider interpretation: No acute fracture identified of the bilateral hips on x-ray.  CT pending  Official radiology report(s): DG Hip Unilat W or Wo Pelvis 2-3 Views Left  Result Date: 09/05/2020 CLINICAL DATA:  Bilateral hip pain after multiple falls. EXAM: DG HIP (WITH OR WITHOUT PELVIS) 2-3V LEFT COMPARISON:  April 05, 2018. FINDINGS: There is no evidence of hip fracture or dislocation. Mild osteophyte formation is noted. IMPRESSION: Mild degenerative joint disease of the left hip. No acute abnormality seen. Electronically Signed   By: Lupita Raider M.D.   On: 09/05/2020 18:42   DG Hip Unilat W or Wo Pelvis 2-3 Views Right  Result Date: 09/05/2020 CLINICAL DATA:  Bilateral hip pain after recent falls. EXAM: DG HIP (WITH OR WITHOUT PELVIS) 2-3V RIGHT COMPARISON:  None. FINDINGS: There is no evidence of hip fracture or dislocation. There is no evidence of arthropathy or other focal bone abnormality. IMPRESSION: Negative. Electronically Signed   By: Lupita Raider M.D.   On: 09/05/2020 18:43      ____________________________________________   INITIAL IMPRESSION / ASSESSMENT AND PLAN / ED COURSE  As part of my medical decision making, I reviewed the following data within the electronic MEDICAL RECORD NUMBER Nursing notes reviewed and incorporated, Labs reviewed, Radiograph reviewed, Discussed with admitting physician Dr. Para March, and Notes from prior ED visits        Patient is an 87 year old female who reports to the emergency department with niece who is POA for evaluation after fall yesterday.  The facility was concerned that she was unable to weight-bear on the left lower extremity today, which is new to her.  They were concerned that the hips were not evaluated.  She denies any new falls or complaints otherwise.  See HPI for further details.  In triage, patient has mild hypertension but otherwise normal vital signs.  Physical exam as above.  Notably, the patient does not have any pain with palpation of the pelvis, hips bilaterally or pain with logroll.  She is able to independently lift the legs without difficulty.  She does have diffuse tenderness of the lumbar spine.  X-rays of the bilateral hips were obtained and are negative for noted acute fracture.  Case was discussed with Dr. Para March for consideration of admission for further evaluation, pain control and PT consultation.  A CT of the pelvis was obtained at the request of Dr. Para March to ensure no occult fracture of the pelvis or hips.  At this time, patient is being signed out to attending provider Dr. Derrill Kay, who will monitor these results and plan is to continue admission at that time.  Patient is stable at this time.      ____________________________________________   FINAL CLINICAL IMPRESSION(S) / ED DIAGNOSES  Final diagnoses:  None     ED Discharge Orders     None        Note:  This document was prepared using Dragon voice recognition software and may include unintentional dictation errors.    Lucy Chris, PA 09/05/20 1933    Phineas Semen, MD 09/05/20 [phone]

## 2020-09-05 NOTE — ED Triage Notes (Signed)
Pt brought in by niece(poa) from [place].  Pt was here yesterday for a fall, back pain and  left hip pain.  Today, pt will not bear weight on left hip.  Pt alert and drinking a milkshake in triage.

## 2020-09-06 ENCOUNTER — Encounter: Payer: Self-pay | Admitting: Internal Medicine

## 2020-09-06 DIAGNOSIS — M8448XD Pathological fracture, other site, subsequent encounter for fracture with routine healing: Secondary | ICD-10-CM | POA: Diagnosis not present

## 2020-09-06 DIAGNOSIS — E440 Moderate protein-calorie malnutrition: Secondary | ICD-10-CM | POA: Insufficient documentation

## 2020-09-06 LAB — COMPREHENSIVE METABOLIC PANEL
ALT: 24 U/L (ref 0–44)
AST: 23 U/L (ref 15–41)
Albumin: 3.2 g/dL — ABNORMAL LOW (ref 3.5–5.0)
Alkaline Phosphatase: 114 U/L (ref 38–126)
Anion gap: 8 (ref 5–15)
BUN: 25 mg/dL — ABNORMAL HIGH (ref 8–23)
CO2: 27 mmol/L (ref 22–32)
Calcium: 9.9 mg/dL (ref 8.9–10.3)
Chloride: 103 mmol/L (ref 98–111)
Creatinine, Ser: 0.7 mg/dL (ref 0.44–1.00)
GFR, Estimated: >60 mL/min (ref >60)
Glucose, Bld: 118 mg/dL — ABNORMAL HIGH (ref 70–99)
Potassium: 3.6 mmol/L (ref 3.5–5.1)
Sodium: 138 mmol/L (ref 135–145)
Total Bilirubin: 0.7 mg/dL (ref 0.3–1.2)
Total Protein: 6.2 g/dL — ABNORMAL LOW (ref 6.5–8.1)

## 2020-09-06 LAB — CBC WITH DIFFERENTIAL/PLATELET
Abs Immature Granulocytes: 0.03 10*3/uL (ref 0.00–0.07)
Basophils Absolute: 0 10*3/uL (ref 0.0–0.1)
Basophils Relative: 0 %
Eosinophils Absolute: 0.1 10*3/uL (ref 0.0–0.5)
Eosinophils Relative: 2 %
HCT: 33.5 % — ABNORMAL LOW (ref 36.0–46.0)
Hemoglobin: 11.7 g/dL — ABNORMAL LOW (ref 12.0–15.0)
Immature Granulocytes: 0 %
Lymphocytes Relative: 21 %
Lymphs Abs: 1.5 10*3/uL (ref 0.7–4.0)
MCH: 34 pg (ref 26.0–34.0)
MCHC: 34.9 g/dL (ref 30.0–36.0)
MCV: 97.4 fL (ref 80.0–100.0)
Monocytes Absolute: 1.1 10*3/uL — ABNORMAL HIGH (ref 0.1–1.0)
Monocytes Relative: 15 %
Neutro Abs: 4.5 10*3/uL (ref 1.7–7.7)
Neutrophils Relative %: 62 %
Platelets: 154 10*3/uL (ref 150–400)
RBC: 3.44 MIL/uL — ABNORMAL LOW (ref 3.87–5.11)
RDW: 12.8 % (ref 11.5–15.5)
WBC: 7.3 10*3/uL (ref 4.0–10.5)
nRBC: 0 % (ref 0.0–0.2)

## 2020-09-06 MED ORDER — VITAMIN D 25 MCG (1000 UNIT) PO TABS
2000.0000 [IU] | ORAL_TABLET | Freq: Every day | ORAL | Status: DC
  Administered 2020-09-06 – 2020-09-07 (×2): 2000 [IU] via ORAL
  Filled 2020-09-06 (×2): qty 2

## 2020-09-06 MED ORDER — AMLODIPINE BESYLATE 5 MG PO TABS
5.0000 mg | ORAL_TABLET | Freq: Every day | ORAL | Status: DC
  Administered 2020-09-06: 5 mg via ORAL
  Filled 2020-09-06: qty 1

## 2020-09-06 MED ORDER — HYDRALAZINE HCL 20 MG/ML IJ SOLN: 10.0000 mg | INTRAMUSCULAR | Status: DC | PRN

## 2020-09-06 MED ORDER — ADULT MULTIVITAMIN W/MINERALS CH
1.0000 | ORAL_TABLET | Freq: Every day | ORAL | Status: DC
  Administered 2020-09-06 – 2020-09-07 (×2): 1 via ORAL
  Filled 2020-09-06 (×2): qty 1

## 2020-09-06 MED ORDER — ENSURE ENLIVE PO LIQD
237.0000 mL | Freq: Two times a day (BID) | ORAL | Status: DC
  Administered 2020-09-06 – 2020-09-13 (×13): 237 mL via ORAL

## 2020-09-06 MED ORDER — SERTRALINE HCL 50 MG PO TABS
100.0000 mg | ORAL_TABLET | Freq: Every day | ORAL | Status: DC
  Administered 2020-09-06 – 2020-09-07 (×2): 100 mg via ORAL
  Filled 2020-09-06 (×2): qty 2

## 2020-09-06 MED ORDER — AMLODIPINE BESYLATE 10 MG PO TABS
10.0000 mg | ORAL_TABLET | Freq: Every day | ORAL | Status: DC
  Administered 2020-09-07 – 2020-09-13 (×7): 10 mg via ORAL
  Filled 2020-09-06 (×7): qty 1

## 2020-09-06 NOTE — TOC Initial Note (Signed)
Transition of Care [facility]) - Initial/Assessment Note    Patient Details  Name: [name] MRN: [id number] Date of Birth: 1930/05/21  Transition of Care [facility]) CM/SW Contact:    Su Hilt, RN Phone Number: 09/06/2020, 11:23 AM  Clinical Narrative:     Met with the patient in the room at the bedside, she is alert to self, she thought she was at [facility] when asked where she was, she stated  she lives alone, will not be having surgery, anticipate the need for STR SNF, she is agreeable to a bed search, attempted to reach oput to the niece Tammy, unable to reach, left a VM for a call back, Bedsearch sent                    Patient Goals and CMS Choice        Expected Discharge Plan and Services                                                Prior Living Arrangements/Services                       Activities of Daily Living Home Assistive Devices/Equipment: Eyeglasses ADL Screening (condition at time of admission) Is the patient deaf or have difficulty hearing?: Yes Does the patient have difficulty seeing, even when wearing glasses/contacts?: Yes Does the patient have difficulty concentrating, remembering, or making decisions?: Yes Patient able to express need for assistance with ADLs?: No Does the patient have difficulty dressing or bathing?: Yes Independently performs ADLs?: No Communication: Independent, Needs assistance Dressing (OT): Needs assistance Grooming: Needs assistance Bathing: Needs assistance Toileting: Needs assistance In/Out Bed: Dependent Walks in Home: Needs assistance Does the patient have difficulty walking or climbing stairs?: Yes Weakness of Legs: Both Weakness of Arms/Hands: Both  Permission Sought/Granted                  Emotional Assessment              Admission diagnosis:  Inability to bear weight [R26.89] Bilateral sacral insufficiency fracture [X32.44WN] Ulcer of left foot, unspecified ulcer  stage ([place]) [L97.529] Acute low back pain, unspecified back pain laterality, unspecified whether sciatica present [M54.50] Patient Active Problem List   Diagnosis Date Noted   Bilateral sacral insufficiency fracture 09/05/2020   Ambulatory dysfunction 09/05/2020   Fall 09/04/2020   Frequent falls 09/04/2020   Chronic pain    Screening-pulmonary TB 05/20/2019   Cervical spine fracture ([place]) 12/18/2017   Weight loss 12/18/2017   Vascular dementia ([place]) 12/18/2017   Hearing loss 01/09/2016   Routine general medical examination at a health care facility 12/31/2015   Dysuria 09/29/2015   Anxiety and depression 09/21/2014   Pedal edema 06/22/2014   Encounter for Medicare annual wellness exam 03/18/2014   Retinal vein occlusion 03/18/2014   B12 deficiency 03/15/2014   Colon cancer screening 04/19/2013   At risk for falling 12/02/2011   Hyperparathyroidism ([place]) 11/20/2011   Back pain, thoracic 09/30/2011   Scoliosis 09/30/2011   Degenerative disc disease, lumbar 09/30/2011   Renal insufficiency 05/27/2011   Syncope 12/05/2010   Cerebral artery occlusion with cerebral infarction ([place]) 02/26/2008   Hyperlipidemia 05/12/2006   Essential hypertension 05/12/2006   DIVERTICULOSIS, COLON 05/12/2006   Osteopenia 05/12/2006   PCP:  Abner Greenspan, MD  Pharmacy:   [facility], [address] [place] [place] [address] Phone: [phone] Fax: [phone]     Social Determinants of Health (SDOH) Interventions    Readmission Risk Interventions No flowsheet data found.

## 2020-09-06 NOTE — [place] FL2 (Signed)
MEDICAID FL2 LEVEL OF CARE SCREENING TOOL     IDENTIFICATION  Patient Name: [name] Birthdate: 1930-06-19 Sex: female Admission Date (Current Location): 09/05/2020  [facility] and [place] Number:  Chiropodist and Address:  [facility], [address] [place], [place] [address]      Provider Number: [id number]  Attending Physician Name and Address:  Tresa Moore, MD  Relative Name and Phone Number:  Tammy Niece, [phone]    Current Level of Care: Hospital Recommended Level of Care: Skilled Nursing Facility Prior Approval Number:    Date Approved/Denied:   PASRR Number: [id number]  Discharge Plan: SNF    Current Diagnoses: Patient Active Problem List   Diagnosis Date Noted   Bilateral sacral insufficiency fracture 09/05/2020   Ambulatory dysfunction 09/05/2020   Fall 09/04/2020   Frequent falls 09/04/2020   Chronic pain    Screening-pulmonary TB 05/20/2019   Cervical spine fracture (HCC) 12/18/2017   Weight loss 12/18/2017   Vascular dementia (HCC) 12/18/2017   Hearing loss 01/09/2016   Routine general medical examination at a health care facility 12/31/2015   Dysuria 09/29/2015   Anxiety and depression 09/21/2014   Pedal edema 06/22/2014   Encounter for Medicare annual wellness exam 03/18/2014   Retinal vein occlusion 03/18/2014   B12 deficiency 03/15/2014   Colon cancer screening 04/19/2013   At risk for falling 12/02/2011   Hyperparathyroidism (HCC) 11/20/2011   Back pain, thoracic 09/30/2011   Scoliosis 09/30/2011   Degenerative disc disease, lumbar 09/30/2011   Renal insufficiency 05/27/2011   Syncope 12/05/2010   Cerebral artery occlusion with cerebral infarction (HCC) 02/26/2008   Hyperlipidemia 05/12/2006   Essential hypertension 05/12/2006   DIVERTICULOSIS, COLON 05/12/2006   Osteopenia 05/12/2006    Orientation RESPIRATION BLADDER Height & Weight     Self  Normal External catheter  Weight: 47.7 kg (bed weight) Height:  5\' 3"  (160 cm)  BEHAVIORAL SYMPTOMS/MOOD NEUROLOGICAL BOWEL NUTRITION STATUS      Continent Diet (regular)  AMBULATORY STATUS COMMUNICATION OF NEEDS Skin   Extensive Assist Verbally Normal                       Personal Care Assistance Level of Assistance  Bathing, Feeding, Dressing Bathing Assistance: Limited assistance Feeding assistance: Limited assistance Dressing Assistance: Limited assistance     Functional Limitations Info  Hearing, Sight Sight Info: Impaired Hearing Info: Impaired      SPECIAL CARE FACTORS FREQUENCY  OT (By licensed OT), PT (By licensed PT)     PT Frequency: 5 times per week OT Frequency: 5 times per week            Contractures Contractures Info: Not present    Additional Factors Info  Code Status, Allergies Code Status Info: DNR Allergies Info: Celecoxib, Codeine, Lisinopril, Etodolac           Current Medications (09/06/2020):  This is the current hospital active medication list Current Facility-Administered Medications  Medication Dose Route Frequency Provider Last Rate Last Admin   acetaminophen (TYLENOL) tablet 650 mg  650 mg Oral Q6H PRN 09/08/2020, MD       Or   acetaminophen (TYLENOL) suppository 650 mg  650 mg Rectal Q6H PRN Andris Baumann, MD       amLODipine (NORVASC) tablet 5 mg  5 mg Oral Daily Andris Baumann, Sudheer B, MD   5 mg at 09/06/20 1014   atorvastatin (LIPITOR) tablet 40 mg  40 mg Oral QHS Andris Baumann, MD       cholecalciferol (VITAMIN D3) tablet 2,000 Units  2,000 Units Oral Daily Lolita Patella B, MD   2,000 Units at 09/06/20 1014   enoxaparin (LOVENOX) injection 40 mg  40 mg Subcutaneous Q24H Lindajo Royal V, MD   40 mg at 09/05/20 2311   feeding supplement (ENSURE ENLIVE / ENSURE PLUS) liquid 237 mL  237 mL Oral BID BM Sreenath, Sudheer B, MD       HYDROcodone-acetaminophen (NORCO/VICODIN) 5-325 MG per tablet 1-2 tablet  1-2 tablet Oral Q6H PRN Andris Baumann,  MD   2 tablet at 09/06/20 0504   losartan (COZAAR) tablet 100 mg  100 mg Oral Daily Lindajo Royal V, MD   100 mg at 09/06/20 1014   morphine 2 MG/ML injection 0.5 mg  0.5 mg Intravenous Q2H PRN Andris Baumann, MD   0.5 mg at 09/05/20 2243   multivitamin with minerals tablet 1 tablet  1 tablet Oral Daily Lolita Patella B, MD   1 tablet at 09/06/20 1014   ondansetron (ZOFRAN) tablet 4 mg  4 mg Oral Q6H PRN Andris Baumann, MD       Or   ondansetron [facility]) injection 4 mg  4 mg Intravenous Q6H PRN Andris Baumann, MD       senna-docusate (Senokot-S) tablet 1 tablet  1 tablet Oral QHS PRN Andris Baumann, MD       sertraline (ZOLOFT) tablet 100 mg  100 mg Oral Daily Tresa Moore, MD         Discharge Medications: Please see discharge summary for a list of discharge medications.  Relevant Imaging Results:  Relevant Lab Results:   Additional Information SS# [id number]  Barrie Dunker, RN

## 2020-09-06 NOTE — Plan of Care (Signed)

## 2020-09-06 NOTE — Progress Notes (Signed)
Initial Nutrition Assessment  DOCUMENTATION CODES:  Non-severe (moderate) malnutrition in context of social or environmental circumstances  INTERVENTION:  Liberalize diet to regular, encourage PO intake Ensure Enlive po BID, each supplement provides 350 kcal and 20 grams of protein MVI with minerals daily.  2000 IU of vitamin d daily per home medication list  NUTRITION DIAGNOSIS:  Moderate Malnutrition (in the context of social/environmental circumstances) related to  (inadequate oral intake) as evidenced by moderate fat depletion, mild fat depletion, moderate muscle depletion, severe muscle depletion.  GOAL:  Patient will meet greater than or equal to 90% of their needs  MONITOR:  PO intake, Supplement acceptance, Weight trends  REASON FOR ASSESSMENT:  Consult Hip fracture protocol  ASSESSMENT:  87 year old female with history of dementia, HTN, HLD, diverticulosis, Hx stroke, osteopenia and frequent falls presented to the ED for the second time in 24 hours with a complaint of inability to ambulate/bear weight on her left extremity following a fall at her skilled nursing facility on 7/24.   Pt was signed out AMA from ED the night prior to admission after initial imaging did not show acute fractures. Pt was sent back to ED by staff as pt was unable to bear weight, which is not her baseline. Further imaging upon return to the ED showed acute/subacute right sacral insufficiency fracture and possible left sacral insufficiency fracture.  Pt eating breakfast at the time of assessment, 100% of meal consumed along with her milk and juice. Pt very hard of hearing, communication is difficult and pt's hx of dementia inhibits a reliable nutrition hx.   Pt very frail and thin appearing, bed weight reads 105.1 lb, which appears accurate based on visual assessment and stable from weight ~12 months. Previous weight in chart (119 lb) was stated. Pt would benefit from a diet liberalization and nutrition  supplementation to encourage adequate intake and promote weight gain.   Nutritionally Relevant Medications: Scheduled Meds:  atorvastatin  40 mg Oral QHS   PRN Meds: ondansetron, senna-docusate  Labs Reviewed: BUN 25  NUTRITION - FOCUSED PHYSICAL EXAM: Flowsheet Row Most Recent Value  Orbital Region Moderate depletion  Upper Arm Region Mild depletion  Thoracic and Lumbar Region Moderate depletion  Buccal Region Moderate depletion  Temple Region Mild depletion  Clavicle Bone Region Severe depletion  Clavicle and Acromion Bone Region Severe depletion  Scapular Bone Region Moderate depletion  Dorsal Hand Severe depletion  Patellar Region Moderate depletion  Anterior Thigh Region Moderate depletion  Posterior Calf Region Moderate depletion  Edema (RD Assessment) None  Hair Reviewed  Eyes Reviewed  Mouth Reviewed  Skin Reviewed  Nails Reviewed   Diet Order:   Diet Order             Diet regular Room service appropriate? Yes with Assist; Fluid consistency: Thin  Diet effective now                   EDUCATION NEEDS:  No education needs have been identified at this time  Skin:  Skin Assessment: Reviewed RN Assessment  Last BM:  7/27 - type 4  Height:  Ht Readings from Last 1 Encounters:  09/05/20 5\' 3"  (1.6 m)    Weight:  Wt Readings from Last 1 Encounters:  09/06/20 47.7 kg    Ideal Body Weight:  52.3 kg  BMI:  Body mass index is 18.62 kg/m.  Estimated Nutritional Needs:  Kcal:  1600-1800 kcal/d Protein:  80-90 g/d Fluid:  >1.8L/d   09/08/20, RD,  LDN Clinical Dietitian Pager on [place]

## 2020-09-06 NOTE — Evaluation (Signed)
Physical Therapy Evaluation Patient Details Name: [name] MRN: [id number] DOB: 07-06-1930 Today's Date: 09/06/2020   History of Present Illness  87 y.o. female with medical history significant for Dementia, HTN, osteopenia and frequent falls.  She came in 2 days in a row with seperate falls.  Now c/o unable to ambulate/bear weight on her left extremity following a fall at her skilled nursing facilit.  Patient was initially evaluated in the emergency room on 7/25 and had extensive imaging with CT thoracolumar.  She was admitted to observation but was signed out AGAINST MEDICAL ADVICE by her niece, now back with another fall.  Clinical Impression  Pt pleasantly confused and very hard of hearing, but ultimately she was able to follow simple instructions and do some basic mobility and ~10 ft of ambulation in the room.  She c/o low back/pelvic pain that appears to be mostly on the L.  Pt with poor/scoliotic posture and was highly reliant on the walker but did not have any overt LOBs or safety issues apart from quickly fatiguing and c/o increased pain with increased activity.  Pt with multiple recently falls, would benefit from PT at SNF to work back to a more appropriate and safe functional status.      Follow Up Recommendations SNF;Supervision/Assistance - 24 hour    Equipment Recommendations  None recommended by PT    Recommendations for Other Services       Precautions / Restrictions Precautions Precautions: Fall Restrictions Weight Bearing Restrictions: No (WBAT)      Mobility  Bed Mobility Overal bed mobility: Needs Assistance Bed Mobility: Supine to Sit     Supine to sit: Min assist     General bed mobility comments: Pt able to do most of the transition supine to sit, but did need light assist to fully get around to sitting due to pain    Transfers Overall transfer level: Needs assistance Equipment used: Rolling walker (2 wheeled) Transfers: Sit to/from Stand Sit to  Stand: Min assist;From elevated surface         General transfer comment: Pt unable to rise w/o assist, did not need excessive assist but raised bed and helped to maintain weight shift forward to standing.  Needed hands on assist to actually grasp RW handles appropraitely  Ambulation/Gait Ambulation/Gait assistance: Min assist Gait Distance (Feet): 10 Feet Assistive device: Rolling walker (2 wheeled)       General Gait Details: hunched, hesitant, slow and labored ambulation.  She did show increased hesitancy with L WBing but did not have any buckling, etc.  Heavy reliance on the walker/UEs but no LOBs.  Quick to fatigue and needed to sit due to this and increasing pain with increasing time standing.  Stairs            Wheelchair Mobility    Modified Rankin (Stroke Patients Only)       Balance Overall balance assessment: Needs assistance Sitting-balance support: Bilateral upper extremity supported Sitting balance-Leahy Scale: Fair     Standing balance support: Bilateral upper extremity supported Standing balance-Leahy Scale: Fair Standing balance comment: poor tolerance, but able to maintain standing w/o direct assist statically once actually using walker appropriately                             Pertinent Vitals/Pain Pain Assessment: Faces Faces Pain Scale: Hurts even more Pain Location: reaching for L low back with most movement    Home Living Family/patient  expects to be discharged to:: Skilled nursing facility                      Prior Function           Comments: pt unable to answer PLOF questions, appears she has had regular falls     Hand Dominance        Extremity/Trunk Assessment   Upper Extremity Assessment Upper Extremity Assessment: Generalized weakness (very limited shoulder elevation b/l)    Lower Extremity Assessment Lower Extremity Assessment: Generalized weakness       Communication   Communication: HOH   Cognition Arousal/Alertness: Awake/alert Behavior During Therapy: Restless Overall Cognitive Status: History of cognitive impairments - at baseline                                        General Comments      Exercises     Assessment/Plan    PT Assessment Patient needs continued PT services  PT Problem List Decreased strength;Decreased range of motion;Decreased activity tolerance;Decreased balance;Decreased mobility;Decreased cognition;Decreased coordination;Decreased knowledge of use of DME;Decreased safety awareness;Pain       PT Treatment Interventions DME instruction;Gait training;Functional mobility training;Therapeutic activities;Therapeutic exercise;Balance training;Neuromuscular re-education;Cognitive remediation;Patient/family education    PT Goals (Current goals can be found in the Care Plan section)  Acute Rehab PT Goals Patient Stated Goal: none stated PT Goal Formulation: With patient Time For Goal Achievement: 09/20/20 Potential to Achieve Goals: Fair    Frequency Min 2X/week   Barriers to discharge        Co-evaluation               AM-PAC PT "6 Clicks" Mobility  Outcome Measure Help needed turning from your back to your side while in a flat bed without using bedrails?: A Little Help needed moving from lying on your back to sitting on the side of a flat bed without using bedrails?: A Little Help needed moving to and from a bed to a chair (including a wheelchair)?: A Lot Help needed standing up from a chair using your arms (e.g., wheelchair or bedside chair)?: A Lot Help needed to walk in hospital room?: A Lot Help needed climbing 3-5 steps with a railing? : Total 6 Click Score: 13    End of Session Equipment Utilized During Treatment: Gait belt Activity Tolerance: Patient limited by pain Patient left: with chair alarm set;with call bell/phone within reach Nurse Communication: Mobility status PT Visit Diagnosis: Muscle weakness  (generalized) (M62.81);Repeated falls (R29.6);Difficulty in walking, not elsewhere classified (R26.2);Pain Pain - Right/Left: Left Pain - part of body: Hip    Time: 3382-5053 PT Time Calculation (min) (ACUTE ONLY): 27 min   Charges:   PT Evaluation $PT Eval Low Complexity: 1 Low PT Treatments $Gait Training: 8-22 mins        Malachi Pro, DPT 09/06/2020, 2:15 PM

## 2020-09-06 NOTE — Progress Notes (Signed)
PT Cancellation Note  Patient Details Name: [name] MRN: [id number] DOB: May 21, 1930   Cancelled Treatment:    Reason Eval/Treat Not Completed: Patient's level of consciousness Spoke with nurse who reports pt was up most of the night and is sleeping soundly right now.  States she is not appropriate for PT at this time but may be later today.  Will maintain on caseload and check back in as appropriate.  Malachi Pro, DPT 09/06/2020, 9:25 AM

## 2020-09-06 NOTE — Progress Notes (Addendum)
PROGRESS NOTE    [name]  VVO:[id number] DOB: 1930/09/14 DOA: 09/05/2020 PCP: Judy Pimple, MD   Brief Narrative:  87 y.o. female with medical history significant for Dementia, HTN, osteopenia and frequent falls who presents to the ED for the second time in 24 hours with a complaint of inability to ambulate/bear weight on her left extremity following a fall at her skilled nursing facility on 7/24.  Patient was initially evaluated in the emergency room on 7/25 and had extensive imaging with CT thoracolumbar spine after she complained of low back pain following a fall.  She was admitted to observation but was signed out AGAINST MEDICAL ADVICE by her niece later on that evening as patient had voiced no additional complaint.  She was taken back to the [place] facility however, it was noted that patient was still having difficulty weightbearing and there was still suspicion for acute injury from her recent fall so she was sent back to the ED.  Patient continued to have intermittent complaints of low back pain.  There were no reported complaints of difficulty with bladder or bowel control and she was able to move her legs independently once lying down but just unable to weight-bear.  I discussed the case with orthopedic surgery.  No surgical intervention warranted.  Weightbearing as tolerated especially during transfers.  Ensure pain control.  Patient will need placement.  Unable to reach the patient's niece Tammy.  Bed search initiated.   Assessment & Plan:   Principal Problem:   Bilateral sacral insufficiency fracture Active Problems:   Essential hypertension   Osteopenia   Vascular dementia (HCC)   Frequent falls   Ambulatory dysfunction   Malnutrition of moderate degree  Bilateral sacral insufficiency fracture with difficulty weightbearing s/p fall on 7/24 Osteopenia/frequent falls Patient appears comfortable on my examination.  CT abdomen pelvis shows acute/subacute right sacral  insufficiency fracture and possible left sacral insufficiency fracture.  Orthopedics has evaluated the films.  Recommendations as below. Plan: Weightbearing as tolerated, especially during transfers Ensure pain control Therapy evaluations TOC consult for placement     Essential hypertension Suboptimal control Plan: Continue home amlodipine, dose increased to 10 mg daily Continue home Cozaar As needed IV hydralazine     Vascular dementia (HCC) Patient is awake, alert to person only Baseline mentation unclear Delirium precautions   DVT prophylaxis: SQ Lovenox Code Status: DNR Family Communication: None today.  Attempted to reach niece Tammy Disposition Plan: Status is: Inpatient  Remains inpatient appropriate because:Inpatient level of care appropriate due to severity of illness  Dispo: The patient is from: ALF              Anticipated d/c is to: SNF              Patient currently is not medically stable to d/c.   Difficult to place patient No   Bilateral sacral insufficiency fractures.  No surgical intervention warranted.  Therapy evaluations, pain control, placement    Level of care: Med-Surg  Consultants:  Orthopedics  Procedures: (Don't include imaging studies which can be auto populated. Include things that cannot be auto populated i.e. Echo, Carotid and venous dopplers, Foley, Bipap, HD, tubes/drains, wound vac, central lines etc) None  Antimicrobials: (specify start and planned stop date. Auto populated tables are space occupying and do not give end dates) None   Subjective: Seen and examined.  Sitting up in bed.  No visible distress.  History limited by dementia and hearing loss  Objective: Vitals:  09/06/20 0028 09/06/20 0458 09/06/20 0916 09/06/20 1101  BP: (!) 159/85 (!) 179/98 (!) 149/71   Pulse: 72 72 68   Resp: 19 18 16    Temp: (!) 97.5 F (36.4 C) 97.6 F (36.4 C) 98.6 F (37 C)   TempSrc:  Oral    SpO2: 96% 97% 98%   Weight:    47.7 kg   Height:       No intake or output data in the 24 hours ending 09/06/20 1513 Filed Weights   09/05/20 1518 09/05/20 1523 09/06/20 1101  Weight: 54 kg 54 kg 47.7 kg    Examination:  General exam: No acute distress.  Frail Respiratory system: Bibasilar fine crackles.  Normal work of breathing.  Room air Cardiovascular system: S1-S2, regular rate and rhythm, no murmurs, no pedal edema  gastrointestinal system: Soft, nontender, nondistended, normal bowel sounds Central nervous system: Alert, oriented x1, no focal deficits Extremities: Decreased power bilateral lower extremities, gait not assessed Skin: Thin and pale, various excoriations Psychiatry: Judgement and insight appear impaired. Mood & affect flattened.     Data Reviewed: I have personally reviewed following labs and imaging studies  CBC: Recent Labs  Lab 09/04/20 0726 09/06/20 0609  WBC 7.4 7.3  NEUTROABS  --  4.5  HGB 12.6 11.7*  HCT 36.7 33.5*  MCV 99.5 97.4  PLT 160 154   Basic Metabolic Panel: Recent Labs  Lab 09/04/20 0726 09/06/20 0609  NA 140 138  K 3.6 3.6  CL 105 103  CO2 26 27  GLUCOSE 111* 118*  BUN 24* 25*  CREATININE 0.82 0.70  CALCIUM 10.2 9.9   GFR: Estimated Creatinine Clearance: 36.6 mL/min (by C-G formula based on SCr of 0.7 mg/dL). Liver Function Tests: Recent Labs  Lab 09/06/20 0609  AST 23  ALT 24  ALKPHOS 114  BILITOT 0.7  PROT 6.2*  ALBUMIN 3.2*   No results for input(s): LIPASE, AMYLASE in the last 168 hours. No results for input(s): AMMONIA in the last 168 hours. Coagulation Profile: No results for input(s): INR, PROTIME in the last 168 hours. Cardiac Enzymes: Recent Labs  Lab 09/04/20 0726  CKTOTAL 64   BNP (last 3 results) No results for input(s): PROBNP in the last 8760 hours. HbA1C: No results for input(s): HGBA1C in the last 72 hours. CBG: No results for input(s): GLUCAP in the last 168 hours. Lipid Profile: No results for input(s): CHOL, HDL, LDLCALC,  TRIG, CHOLHDL, LDLDIRECT in the last 72 hours. Thyroid Function Tests: No results for input(s): TSH, T4TOTAL, FREET4, T3FREE, THYROIDAB in the last 72 hours. Anemia Panel: No results for input(s): VITAMINB12, FOLATE, FERRITIN, TIBC, IRON, RETICCTPCT in the last 72 hours. Sepsis Labs: No results for input(s): PROCALCITON, LATICACIDVEN in the last 168 hours.  Recent Results (from the past 240 hour(s))  Resp Panel by RT-PCR (Flu A&B, Covid) Nasopharyngeal Swab     Status: None   Collection Time: 09/04/20  3:03 PM   Specimen: Nasopharyngeal Swab; Nasopharyngeal(NP) swabs in vial transport medium  Result Value Ref Range Status   SARS Coronavirus 2 by RT PCR NEGATIVE NEGATIVE Final    Comment: (NOTE) SARS-CoV-2 target nucleic acids are NOT DETECTED.  The SARS-CoV-2 RNA is generally detectable in upper respiratory specimens during the acute phase of infection. The lowest concentration of SARS-CoV-2 viral copies this assay can detect is 138 copies/mL. A negative result does not preclude SARS-Cov-2 infection and should not be used as the sole basis for treatment or other patient management decisions.  A negative result may occur with  improper specimen collection/handling, submission of specimen other than nasopharyngeal swab, presence of viral mutation(s) within the areas targeted by this assay, and inadequate number of viral copies(<138 copies/mL). A negative result must be combined with clinical observations, patient history, and epidemiological information. The expected result is Negative.  Fact Sheet for Patients:  BloggerCourse.comhttps://www.fda.gov/media/152166/download  Fact Sheet for Healthcare Providers:  SeriousBroker.ithttps://www.fda.gov/media/152162/download  This test is no t yet approved or cleared by the [place] FDA and  has been authorized for detection and/or diagnosis of SARS-CoV-2 by FDA under an Emergency Use Authorization (EUA). This EUA will remain  in effect (meaning this test can be  used) for the duration of the COVID-19 declaration under Section 564(b)(1) of the Act, 21 U.S.C.section 360bbb-3(b)(1), unless the authorization is terminated  or revoked sooner.       Influenza A by PCR NEGATIVE NEGATIVE Final   Influenza B by PCR NEGATIVE NEGATIVE Final    Comment: (NOTE) The Xpert Xpress SARS-CoV-2/FLU/RSV plus assay is intended as an aid in the diagnosis of influenza from Nasopharyngeal swab specimens and should not be used as a sole basis for treatment. Nasal washings and aspirates are unacceptable for Xpert Xpress SARS-CoV-2/FLU/RSV testing.  Fact Sheet for Patients: BloggerCourse.comhttps://www.fda.gov/media/152166/download  Fact Sheet for Healthcare Providers: SeriousBroker.ithttps://www.fda.gov/media/152162/download  This test is not yet approved or cleared by the [place] FDA and has been authorized for detection and/or diagnosis of SARS-CoV-2 by FDA under an Emergency Use Authorization (EUA). This EUA will remain in effect (meaning this test can be used) for the duration of the COVID-19 declaration under Section 564(b)(1) of the Act, 21 U.S.C. section 360bbb-3(b)(1), unless the authorization is terminated or revoked.  Performed at [facility], [address] [place], [place] [address]          Radiology Studies: CT PELVIS WO CONTRAST  Result Date: 09/05/2020 CLINICAL DATA:  Fall, back pain left hip pain, nonweightbearing EXAM: CT PELVIS WITHOUT CONTRAST TECHNIQUE: Multidetector CT imaging of the pelvis was performed following the standard protocol without intravenous contrast. COMPARISON:  Oblique radiograph 08/19/2019, CT L-spine 09/04/2020 FINDINGS: Urinary Tract: Imaged portion of the lower pole left kidney and visible segments of the ureters are grossly unremarkable. Urinary bladder is free of acute abnormality accounting for degree of distension. Catch catheter noted medially. Bowel: No conspicuous large or small bowel thickening or dilatation. Normal  air-filled appendix in the right lower quadrant. No evidence of bowel obstruction. Vascular/Lymphatic: Atherosclerotic calcifications within the distal abdominal aorta and branch vessels. No aneurysm or ectasia. No enlarged pelvic nodes lymph nodes. Reproductive: Prior hysterectomy. 1.4 cm cystic focus along the right pelvic sidewall, likely adnexal cyst. Other: Small amount of skin thickening and subcutaneous stranding superficial to the sacrum towards the superior gluteal cleft. Small amount of fluid within the umbilicus. No body wall hematoma. Musculoskeletal: Diffuse bony demineralization may limit detection of subtle or nondisplaced injuries. There is an acute to subacute right sacral insufficiency fracture with cortical step-off of the sacral ala towards the right SI joint (3/54) additional questionable band of sclerosis and lucency through the left sacral ala towards the SI joint could reflect a left insufficiency fracture as well. No other acute traumatic osseous injury of the bony pelvis or proximal femora. Internal rotation of the left hip is noted. Background of discogenic and facet degenerative changes in the imaged lumbar spine. Pseudoarticulation between the L5 transverse processes and adjacent sacral ala, can be a pain generator if there are concordant clinical  features Bertolotti syndrome. Levocurvature of the lumbar levels. Remote appearing lateral compression deformity of L4 with fusion of the L3-4 vertebral bodies on the left. Additional underlying SI joint and symphysis pubis arthrosis. Moderate bilateral hip osteoarthrosis as well. Muscle bulk is slightly diminished but without gross atrophy or other acute or worrisome muscular abnormality. IMPRESSION: 1. Acute to subacute right sacral insufficiency fracture. Questionable left sacral insufficiency fracture as well. 2. No other acute traumatic osseous injury of the bony pelvis. 3. Remote appearing deformity of the L4 vertebral level with bony  fusion of the L3-4 left vertebral body. 4. Degenerative changes of the spine, hips and pelvis as above. 5. Minimal skin thickening superficial to the right sacrum towards the superior margin of the gluteal cleft. Correlate with visual inspection. 6. Prior hysterectomy. 1.4 cm cystic structure in the left adnexa. Given the size of this focus, no follow-up imaging is recommended. Note: This recommendation does not to patients with increased risk (genetic, family history, elevated tumor markers or other high-risk factors) of ovarian cancer. Reference: JACR 2020 Feb; 17(2):248-254 7.  Aortic Atherosclerosis (ICD10-I70.0). Electronically Signed   By: Kreg Shropshire M.D.   On: 09/05/2020 20:37   DG Hip Unilat W or Wo Pelvis 2-3 Views Left  Result Date: 09/05/2020 CLINICAL DATA:  Bilateral hip pain after multiple falls. EXAM: DG HIP (WITH OR WITHOUT PELVIS) 2-3V LEFT COMPARISON:  April 05, 2018. FINDINGS: There is no evidence of hip fracture or dislocation. Mild osteophyte formation is noted. IMPRESSION: Mild degenerative joint disease of the left hip. No acute abnormality seen. Electronically Signed   By: Lupita Raider M.D.   On: 09/05/2020 18:42   DG Hip Unilat W or Wo Pelvis 2-3 Views Right  Result Date: 09/05/2020 CLINICAL DATA:  Bilateral hip pain after recent falls. EXAM: DG HIP (WITH OR WITHOUT PELVIS) 2-3V RIGHT COMPARISON:  None. FINDINGS: There is no evidence of hip fracture or dislocation. There is no evidence of arthropathy or other focal bone abnormality. IMPRESSION: Negative. Electronically Signed   By: Lupita Raider M.D.   On: 09/05/2020 18:43        Scheduled Meds:  amLODipine  5 mg Oral Daily   atorvastatin  40 mg Oral QHS   cholecalciferol  2,000 Units Oral Daily   enoxaparin (LOVENOX) injection  40 mg Subcutaneous Q24H   feeding supplement  237 mL Oral BID BM   losartan  100 mg Oral Daily   multivitamin with minerals  1 tablet Oral Daily   sertraline  100 mg Oral Daily    Continuous Infusions:   LOS: 1 day    Time spent: 25 minutes    Tresa Moore, MD [facility] Pager [phone]  If 7PM-7AM, please contact night-coverage 09/06/2020, 3:13 PM

## 2020-09-07 DIAGNOSIS — Z7189 Other specified counseling: Secondary | ICD-10-CM | POA: Diagnosis not present

## 2020-09-07 DIAGNOSIS — M8448XD Pathological fracture, other site, subsequent encounter for fracture with routine healing: Secondary | ICD-10-CM | POA: Diagnosis not present

## 2020-09-07 MED ORDER — OXYCODONE HCL 5 MG PO TABS
5.0000 mg | ORAL_TABLET | ORAL | Status: DC | PRN
  Filled 2020-09-07: qty 1

## 2020-09-07 MED ORDER — GABAPENTIN 100 MG PO CAPS
100.0000 mg | ORAL_CAPSULE | Freq: Two times a day (BID) | ORAL | Status: DC
  Administered 2020-09-07 – 2020-09-13 (×12): 100 mg via ORAL
  Filled 2020-09-07 (×12): qty 1

## 2020-09-07 MED ORDER — OXYCODONE HCL 5 MG PO TABS: 5.0000 mg | ORAL_TABLET | ORAL | Status: DC | PRN

## 2020-09-07 MED ORDER — OXYCODONE HCL 5 MG PO TABS
5.0000 mg | ORAL_TABLET | Freq: Four times a day (QID) | ORAL | Status: DC
  Administered 2020-09-07 – 2020-09-08 (×4): 5 mg via ORAL
  Filled 2020-09-07 (×4): qty 1

## 2020-09-07 MED ORDER — CYCLOBENZAPRINE HCL 10 MG PO TABS: 5.0000 mg | ORAL_TABLET | Freq: Three times a day (TID) | ORAL | Status: DC | PRN

## 2020-09-07 MED ORDER — MORPHINE SULFATE (PF) 2 MG/ML IV SOLN: 1.0000 mg | INTRAVENOUS | Status: DC | PRN

## 2020-09-07 MED ORDER — ACETAMINOPHEN 500 MG PO TABS
500.0000 mg | ORAL_TABLET | Freq: Four times a day (QID) | ORAL | Status: DC
  Administered 2020-09-07 – 2020-09-13 (×23): 500 mg via ORAL
  Filled 2020-09-07 (×24): qty 1

## 2020-09-07 NOTE — TOC Progression Note (Addendum)
Transition of Care [facility]) - Progression Note    Patient Details  Name: [name] MRN: [id number] Date of Birth: 1930/05/11  [facility] CM/SW Contact  Barrie Dunker, RN Phone Number: 09/07/2020, 10:48 AM  Clinical Narrative:     Sent the Hospice referral to [facility], the patient's niece Synetta Fail will be the contact, Faxed information to [phone]      Expected Discharge Plan and Services                                                 Social Determinants of Health (SDOH) Interventions    Readmission Risk Interventions No flowsheet data found.

## 2020-09-07 NOTE — Consult Note (Signed)
ORTHOPAEDIC CONSULTATION  REQUESTING PHYSICIAN: Tresa Moore, MD    HPI: [name] is a 87 y.o. female 87 y.o. female with medical history significant for Dementia, HTN, osteopenia and frequent falls who presents to the ED for the second time in 24 hours with a complaint of inability to ambulate/bear weight on her left extremity following a fall at her skilled nursing facility on 7/24.  Patient was initially evaluated in the emergency room on 7/25 and had extensive imaging with CT thoracolumbar spine after she complained of low back pain following a fall.  She was admitted to observation but was signed out AGAINST MEDICAL ADVICE by her niece later on that evening as patient had voiced no additional complaint.  She was taken back to the [facility] facility however, it was noted that patient was still having difficulty weightbearing and there was still suspicion for acute injury from her recent fall so she was sent back to the ED.  Patient continued to have intermittent complaints of low back pain.  There were no reported complaints of difficulty with bladder or bowel control and she was able to move her legs independently once lying down but just unable to weight-bear.    Past Medical History:  Diagnosis Date   Chronic pain    DDD (degenerative disc disease)    Diverticulosis    Hyperlipidemia    Hypertension    Osteopenia    Stroke (HCC)    Syncope    Vascular dementia (HCC)    Past Surgical History:  Procedure Laterality Date   ABDOMINAL HYSTERECTOMY     carotid doppler     normal   DOPPLER ECHOCARDIOGRAPHY     normal   EXPLORATORY LAPAROTOMY     2 times both neg    LAMINECTOMY  06-2003   l2-l4   SPINE SURGERY     l3-l4 microdiscectomy    TONSILLECTOMY     Social History   Socioeconomic History   Marital status: Single    Spouse name: Not on file   Number of children: Not on file   Years of education: 12   Highest education level: Not on file  Occupational  History   Occupation: retired  Tobacco Use   Smoking status: Never   Smokeless tobacco: Never  Vaping Use   Vaping Use: Never used  Substance and Sexual Activity   Alcohol use: No    Alcohol/week: 0.0 standard drinks   Drug use: No   Sexual activity: Not Currently  Other Topics Concern   Not on file  Social History Narrative   She lives alone in a [place] mobile home.   She worked as a Teacher, early years/pre at [employer] level of education:  12th grade   Social Determinants of Corporate investment banker Strain: Not on [employer] Insecurity: Not on file  Transportation Needs: Not on file  Physical Activity: Not on file  Stress: Not on file  Social Connections: Not on file   Family History  Problem Relation Age of Onset   Hypertension Father    COPD Father    Other Mother        Deceased, 9   Healthy Brother    Allergies  Allergen Reactions   Celecoxib Other (See Comments)    affects kidneys   Codeine Nausea Only and Other (See Comments)    dizziness   Lisinopril Cough   Etodolac Rash   Prior to Admission medications  Medication Sig Start Date End Date Taking? Authorizing Provider  amLODipine (NORVASC) 5 MG tablet Take 5 mg by mouth daily.   Yes [provider]  Cholecalciferol (VITAMIN D PO) Take 2,000 Units by mouth daily.   Yes [provider]  losartan (COZAAR) 100 MG tablet TAKE 1/2 TABLET (50 MG TOTAL) BY MOUTH DAILY. Patient taking differently: Take 100 mg by mouth daily. 02/25/17  Yes Tower, Audrie Gallus, MD  Multiple Vitamin (MULTIVITAMIN ADULT) TABS Take 1 tablet by mouth daily.   Yes [provider]  sertraline (ZOLOFT) 100 MG tablet Take 1 tablet (100 mg total) by mouth daily. 07/19/19  Yes Tower, Audrie Gallus, MD  acetaminophen (TYLENOL) 325 MG tablet Take 650 mg by mouth every 6 (six) hours as needed.    [provider]  atorvastatin (LIPITOR) 40 MG tablet Take 1 tablet (40 mg total) by mouth at  bedtime. 02/25/17 09/04/20  Tower, Audrie Gallus, MD  loperamide (IMODIUM) 2 MG capsule Take 2 mg by mouth 2 (two) times daily. 08/08/20   [provider]   CT PELVIS WO CONTRAST  Result Date: 09/05/2020 CLINICAL DATA:  Fall, back pain left hip pain, nonweightbearing EXAM: CT PELVIS WITHOUT CONTRAST TECHNIQUE: Multidetector CT imaging of the pelvis was performed following the standard protocol without intravenous contrast. COMPARISON:  Oblique radiograph 08/19/2019, CT L-spine 09/04/2020 FINDINGS: Urinary Tract: Imaged portion of the lower pole left kidney and visible segments of the ureters are grossly unremarkable. Urinary bladder is free of acute abnormality accounting for degree of distension. Catch catheter noted medially. Bowel: No conspicuous large or small bowel thickening or dilatation. Normal air-filled appendix in the right lower quadrant. No evidence of bowel obstruction. Vascular/Lymphatic: Atherosclerotic calcifications within the distal abdominal aorta and branch vessels. No aneurysm or ectasia. No enlarged pelvic nodes lymph nodes. Reproductive: Prior hysterectomy. 1.4 cm cystic focus along the right pelvic sidewall, likely adnexal cyst. Other: Small amount of skin thickening and subcutaneous stranding superficial to the sacrum towards the superior gluteal cleft. Small amount of fluid within the umbilicus. No body wall hematoma. Musculoskeletal: Diffuse bony demineralization may limit detection of subtle or nondisplaced injuries. There is an acute to subacute right sacral insufficiency fracture with cortical step-off of the sacral ala towards the right SI joint (3/54) additional questionable band of sclerosis and lucency through the left sacral ala towards the SI joint could reflect a left insufficiency fracture as well. No other acute traumatic osseous injury of the bony pelvis or proximal femora. Internal rotation of the left hip is noted. Background of discogenic and facet degenerative  changes in the imaged lumbar spine. Pseudoarticulation between the L5 transverse processes and adjacent sacral ala, can be a pain generator if there are concordant clinical features Bertolotti syndrome. Levocurvature of the lumbar levels. Remote appearing lateral compression deformity of L4 with fusion of the L3-4 vertebral bodies on the left. Additional underlying SI joint and symphysis pubis arthrosis. Moderate bilateral hip osteoarthrosis as well. Muscle bulk is slightly diminished but without gross atrophy or other acute or worrisome muscular abnormality. IMPRESSION: 1. Acute to subacute right sacral insufficiency fracture. Questionable left sacral insufficiency fracture as well. 2. No other acute traumatic osseous injury of the bony pelvis. 3. Remote appearing deformity of the L4 vertebral level with bony fusion of the L3-4 left vertebral body. 4. Degenerative changes of the spine, hips and pelvis as above. 5. Minimal skin thickening superficial to the right sacrum towards the superior margin of the gluteal cleft. Correlate with visual  inspection. 6. Prior hysterectomy. 1.4 cm cystic structure in the left adnexa. Given the size of this focus, no follow-up imaging is recommended. Note: This recommendation does not to patients with increased risk (genetic, family history, elevated tumor markers or other high-risk factors) of ovarian cancer. Reference: JACR 2020 Feb; 17(2):248-254 7.  Aortic Atherosclerosis (ICD10-I70.0). Electronically Signed   By: Kreg Shropshire M.D.   On: 09/05/2020 20:37   DG Hip Unilat W or Wo Pelvis 2-3 Views Left  Result Date: 09/05/2020 CLINICAL DATA:  Bilateral hip pain after multiple falls. EXAM: DG HIP (WITH OR WITHOUT PELVIS) 2-3V LEFT COMPARISON:  April 05, 2018. FINDINGS: There is no evidence of hip fracture or dislocation. Mild osteophyte formation is noted. IMPRESSION: Mild degenerative joint disease of the left hip. No acute abnormality seen. Electronically Signed   By: Lupita Raider M.D.   On: 09/05/2020 18:42   DG Hip Unilat W or Wo Pelvis 2-3 Views Right  Result Date: 09/05/2020 CLINICAL DATA:  Bilateral hip pain after recent falls. EXAM: DG HIP (WITH OR WITHOUT PELVIS) 2-3V RIGHT COMPARISON:  None. FINDINGS: There is no evidence of hip fracture or dislocation. There is no evidence of arthropathy or other focal bone abnormality. IMPRESSION: Negative. Electronically Signed   By: Lupita Raider M.D.   On: 09/05/2020 18:43    Positive ROS: All other systems have been reviewed and were otherwise negative with the exception of those mentioned in the HPI and as above.  Physical Exam: General: Alert, no acute distress Cardiovascular: No pedal edema Respiratory: No cyanosis, no use of accessory musculature GI: No organomegaly, abdomen is soft and non-tender Skin: No lesions in the area of chief complaint Neurologic: Sensation intact distally Psychiatric: Patient is competent for consent with normal mood and affect Lymphatic: No axillary or cervical lymphadenopathy  MUSCULOSKELETAL: Tenderness at pelvic area. Compartments soft. Good cap refill. Motor and sensory intact distally.  Assessment:  Acute/subacute right sacral insufficiency fracture and possible left sacral insufficiency fracture.  Plan: No surgery needed Weightbearing as tolerated, especially during transfers Continue pain control with hydrocodone Therapy evaluations Discharge to SNF once medically stable Follow up in office in 2 weeks with Dr. Noralyn Pick Call office for appointment [phone]   Altamese Cabal, PA-C    09/07/2020 6:42 AM

## 2020-09-07 NOTE — Plan of Care (Signed)

## 2020-09-07 NOTE — Progress Notes (Signed)
PROGRESS NOTE    [name]  EUM:[id number] DOB: 1930-11-23 DOA: 09/05/2020 PCP: Judy Pimple, MD   Brief Narrative:  87 y.o. female with medical history significant for Dementia, HTN, osteopenia and frequent falls who presents to the ED for the second time in 24 hours with a complaint of inability to ambulate/bear weight on her left extremity following a fall at her skilled nursing facility on 7/24.  Patient was initially evaluated in the emergency room on 7/25 and had extensive imaging with CT thoracolumbar spine after she complained of low back pain following a fall.  She was admitted to observation but was signed out AGAINST MEDICAL ADVICE by her niece later on that evening as patient had voiced no additional complaint.  She was taken back to the [facility] facility however, it was noted that patient was still having difficulty weightbearing and there was still suspicion for acute injury from her recent fall so she was sent back to the ED.  Patient continued to have intermittent complaints of low back pain.  There were no reported complaints of difficulty with bladder or bowel control and she was able to move her legs independently once lying down but just unable to weight-bear.  I discussed the case with orthopedic surgery.  No surgical intervention warranted.  Weightbearing as tolerated especially during transfers.  Ensure pain control.  Patient will need placement.    7/28: Had an extensive and productive conversation with the patient's niece and MPOA Synetta Fail.  Synetta Fail relayed that the patient was independent up until 2 years ago when she had a devastating fall and resultant cervical spine fracture.  She was hospitalized at [facility] and because of her advanced age and extreme surgical risk no surgical invention was pursued and the patient ended up discharging to skilled nursing facility for therapy.  Since that time she has had multiple falls and overall decreased level of functioning both physical  and mental.  The patient's niece states recently she has noted the patient to lay in bed all day, be very apathetic, refused to eat.  I did relay that her brain imaging is significant for age-related volume loss and her changes in behavior was likely a manifestation of advanced dementia.  In my opinion patient is appropriate for palliative care and hospice services.  I have offered this to the patient's niece who is in agreement but needs to speak to her sister and the other MPOA.  In the meantime will consult palliative care and will have TOC reach out to hospice liaison.   Assessment & Plan:   Principal Problem:   Bilateral sacral insufficiency fracture Active Problems:   Essential hypertension   Osteopenia   Vascular dementia (HCC)   Frequent falls   Ambulatory dysfunction   Malnutrition of moderate degree  Bilateral sacral insufficiency fracture with difficulty weightbearing s/p fall on 7/24 Osteopenia/frequent falls Patient appears comfortable on my examination.  CT abdomen pelvis shows acute/subacute right sacral insufficiency fracture and possible left sacral insufficiency fracture.  Orthopedics has evaluated the films.  Recommendations as below. Plan: Weightbearing as tolerated, especially during transfers Ensure pain control Therapy evaluations TOC following for placement.  Most likely the patient will return to [place] with hospice services.  In the meantime we will ensure pain control.     Essential hypertension Improved control over interval Plan: Continue amlodipine 10 mg daily Continue home Cozaar As needed IV hydralazine     Vascular dementia [facility]) Patient is awake, alert to person only Per the  patient's niece baseline mentation is poor and has been deteriorating.  Brain imaging with significant volume loss   DVT prophylaxis: SQ Lovenox Code Status: DNR Family Communication: Rich Reining [phone] on 7/28  Disposition Plan: Status is:  Inpatient  Remains inpatient appropriate because:Inpatient level of care appropriate due to severity of illness  Dispo: The patient is from: ALF              Anticipated d/c is to: SNF              Patient currently is not medically stable to d/c.   Difficult to place patient No   Bilateral sacral insufficiency fractures.  No surgical intervention warranted.  Plan of care consulted.  Hospice liaison contacted.  Anticipate disposition back to [place] with hospice services.   Level of care: Med-Surg  Consultants:  Orthopedics Palliative care  Procedures: None  Antimicrobials:  None   Subjective: Seen and examined.  Slumped over in bed.  This after multiple repositioning attempts with nursing.  Difficult understand.  Does endorse low back pain.  Improved after administration of oxycodone and morphine.  Objective: Vitals:   09/07/20 0517 09/07/20 0742 09/07/20 0940 09/07/20 1155  BP: (!) 138/92 (!) 184/86 129/86 123/70  Pulse: (!) 55 (!) 51 74 74  Resp: 18 15  15   Temp: (!) 97.5 F (36.4 C) 98.1 F (36.7 C)  98.1 F (36.7 C)  TempSrc:      SpO2: 98% 98%  (!) 84%  Weight:      Height:       No intake or output data in the 24 hours ending 09/07/20 1330 Filed Weights   09/05/20 1518 09/05/20 1523 09/06/20 1101  Weight: 54 kg 54 kg 47.7 kg    Examination:  General exam: Mild distress.  Frail.  Chronically ill Respiratory system: Bibasilar fine crackles.  Normal work of breathing.  Room air Cardiovascular system: S1-S2, regular rate and rhythm, no murmurs, no pedal edema  gastrointestinal system: Scaphoid, nontender, nondistended, normal bowel sounds Central nervous system: Awake, oriented x1, no focal deficits Extremities: Decreased power bilateral lower extremities, gait not assessed Skin: Thin and pale, various excoriations Psychiatry: Judgement and insight appear impaired. Mood & affect flattened.     Data Reviewed: I have personally reviewed following labs  and imaging studies  CBC: Recent Labs  Lab 09/04/20 0726 09/06/20 0609  WBC 7.4 7.3  NEUTROABS  --  4.5  HGB 12.6 11.7*  HCT 36.7 33.5*  MCV 99.5 97.4  PLT 160 154   Basic Metabolic Panel: Recent Labs  Lab 09/04/20 0726 09/06/20 0609  NA 140 138  K 3.6 3.6  CL 105 103  CO2 26 27  GLUCOSE 111* 118*  BUN 24* 25*  CREATININE 0.82 0.70  CALCIUM 10.2 9.9   GFR: Estimated Creatinine Clearance: 36.6 mL/min (by C-G formula based on SCr of 0.7 mg/dL). Liver Function Tests: Recent Labs  Lab 09/06/20 0609  AST 23  ALT 24  ALKPHOS 114  BILITOT 0.7  PROT 6.2*  ALBUMIN 3.2*   No results for input(s): LIPASE, AMYLASE in the last 168 hours. No results for input(s): AMMONIA in the last 168 hours. Coagulation Profile: No results for input(s): INR, PROTIME in the last 168 hours. Cardiac Enzymes: Recent Labs  Lab 09/04/20 0726  CKTOTAL 64   BNP (last 3 results) No results for input(s): PROBNP in the last 8760 hours. HbA1C: No results for input(s): HGBA1C in the last 72 hours. CBG: No results for  input(s): GLUCAP in the last 168 hours. Lipid Profile: No results for input(s): CHOL, HDL, LDLCALC, TRIG, CHOLHDL, LDLDIRECT in the last 72 hours. Thyroid Function Tests: No results for input(s): TSH, T4TOTAL, FREET4, T3FREE, THYROIDAB in the last 72 hours. Anemia Panel: No results for input(s): VITAMINB12, FOLATE, FERRITIN, TIBC, IRON, RETICCTPCT in the last 72 hours. Sepsis Labs: No results for input(s): PROCALCITON, LATICACIDVEN in the last 168 hours.  Recent Results (from the past 240 hour(s))  Resp Panel by RT-PCR (Flu A&B, Covid) Nasopharyngeal Swab     Status: None   Collection Time: 09/04/20  3:03 PM   Specimen: Nasopharyngeal Swab; Nasopharyngeal(NP) swabs in vial transport medium  Result Value Ref Range Status   SARS Coronavirus 2 by RT PCR NEGATIVE NEGATIVE Final    Comment: (NOTE) SARS-CoV-2 target nucleic acids are NOT DETECTED.  The SARS-CoV-2 RNA is  generally detectable in upper respiratory specimens during the acute phase of infection. The lowest concentration of SARS-CoV-2 viral copies this assay can detect is 138 copies/mL. A negative result does not preclude SARS-Cov-2 infection and should not be used as the sole basis for treatment or other patient management decisions. A negative result may occur with  improper specimen collection/handling, submission of specimen other than nasopharyngeal swab, presence of viral mutation(s) within the areas targeted by this assay, and inadequate number of viral copies(<138 copies/mL). A negative result must be combined with clinical observations, patient history, and epidemiological information. The expected result is Negative.  Fact Sheet for Patients:  BloggerCourse.com  Fact Sheet for Healthcare Providers:  SeriousBroker.it  This test is no t yet approved or cleared by the [place] FDA and  has been authorized for detection and/or diagnosis of SARS-CoV-2 by FDA under an Emergency Use Authorization (EUA). This EUA will remain  in effect (meaning this test can be used) for the duration of the COVID-19 declaration under Section 564(b)(1) of the Act, 21 U.S.C.section 360bbb-3(b)(1), unless the authorization is terminated  or revoked sooner.       Influenza A by PCR NEGATIVE NEGATIVE Final   Influenza B by PCR NEGATIVE NEGATIVE Final    Comment: (NOTE) The Xpert Xpress SARS-CoV-2/FLU/RSV plus assay is intended as an aid in the diagnosis of influenza from Nasopharyngeal swab specimens and should not be used as a sole basis for treatment. Nasal washings and aspirates are unacceptable for Xpert Xpress SARS-CoV-2/FLU/RSV testing.  Fact Sheet for Patients: BloggerCourse.com  Fact Sheet for Healthcare Providers: SeriousBroker.it  This test is not yet approved or cleared by the [place] FDA and has been authorized for detection and/or diagnosis of SARS-CoV-2 by FDA under an Emergency Use Authorization (EUA). This EUA will remain in effect (meaning this test can be used) for the duration of the COVID-19 declaration under Section 564(b)(1) of the Act, 21 U.S.C. section 360bbb-3(b)(1), unless the authorization is terminated or revoked.  Performed at [facility], [address] [place], [place] [address]          Radiology Studies: CT PELVIS WO CONTRAST  Result Date: 09/05/2020 CLINICAL DATA:  Fall, back pain left hip pain, nonweightbearing EXAM: CT PELVIS WITHOUT CONTRAST TECHNIQUE: Multidetector CT imaging of the pelvis was performed following the standard protocol without intravenous contrast. COMPARISON:  Oblique radiograph 08/19/2019, CT L-spine 09/04/2020 FINDINGS: Urinary Tract: Imaged portion of the lower pole left kidney and visible segments of the ureters are grossly unremarkable. Urinary bladder is free of acute abnormality accounting for degree of distension. Catch catheter noted medially. Bowel: No  conspicuous large or small bowel thickening or dilatation. Normal air-filled appendix in the right lower quadrant. No evidence of bowel obstruction. Vascular/Lymphatic: Atherosclerotic calcifications within the distal abdominal aorta and branch vessels. No aneurysm or ectasia. No enlarged pelvic nodes lymph nodes. Reproductive: Prior hysterectomy. 1.4 cm cystic focus along the right pelvic sidewall, likely adnexal cyst. Other: Small amount of skin thickening and subcutaneous stranding superficial to the sacrum towards the superior gluteal cleft. Small amount of fluid within the umbilicus. No body wall hematoma. Musculoskeletal: Diffuse bony demineralization may limit detection of subtle or nondisplaced injuries. There is an acute to subacute right sacral insufficiency fracture with cortical step-off of the sacral ala towards the right SI joint (3/54)  additional questionable band of sclerosis and lucency through the left sacral ala towards the SI joint could reflect a left insufficiency fracture as well. No other acute traumatic osseous injury of the bony pelvis or proximal femora. Internal rotation of the left hip is noted. Background of discogenic and facet degenerative changes in the imaged lumbar spine. Pseudoarticulation between the L5 transverse processes and adjacent sacral ala, can be a pain generator if there are concordant clinical features Bertolotti syndrome. Levocurvature of the lumbar levels. Remote appearing lateral compression deformity of L4 with fusion of the L3-4 vertebral bodies on the left. Additional underlying SI joint and symphysis pubis arthrosis. Moderate bilateral hip osteoarthrosis as well. Muscle bulk is slightly diminished but without gross atrophy or other acute or worrisome muscular abnormality. IMPRESSION: 1. Acute to subacute right sacral insufficiency fracture. Questionable left sacral insufficiency fracture as well. 2. No other acute traumatic osseous injury of the bony pelvis. 3. Remote appearing deformity of the L4 vertebral level with bony fusion of the L3-4 left vertebral body. 4. Degenerative changes of the spine, hips and pelvis as above. 5. Minimal skin thickening superficial to the right sacrum towards the superior margin of the gluteal cleft. Correlate with visual inspection. 6. Prior hysterectomy. 1.4 cm cystic structure in the left adnexa. Given the size of this focus, no follow-up imaging is recommended. Note: This recommendation does not to patients with increased risk (genetic, family history, elevated tumor markers or other high-risk factors) of ovarian cancer. Reference: JACR 2020 Feb; 17(2):248-254 7.  Aortic Atherosclerosis (ICD10-I70.0). Electronically Signed   By: Kreg ShropshirePrice  DeHay M.D.   On: 09/05/2020 20:37   DG Hip Unilat W or Wo Pelvis 2-3 Views Left  Result Date: 09/05/2020 CLINICAL DATA:  Bilateral hip  pain after multiple falls. EXAM: DG HIP (WITH OR WITHOUT PELVIS) 2-3V LEFT COMPARISON:  April 05, 2018. FINDINGS: There is no evidence of hip fracture or dislocation. Mild osteophyte formation is noted. IMPRESSION: Mild degenerative joint disease of the left hip. No acute abnormality seen. Electronically Signed   By: Lupita RaiderJames  Green Jr M.D.   On: 09/05/2020 18:42   DG Hip Unilat W or Wo Pelvis 2-3 Views Right  Result Date: 09/05/2020 CLINICAL DATA:  Bilateral hip pain after recent falls. EXAM: DG HIP (WITH OR WITHOUT PELVIS) 2-3V RIGHT COMPARISON:  None. FINDINGS: There is no evidence of hip fracture or dislocation. There is no evidence of arthropathy or other focal bone abnormality. IMPRESSION: Negative. Electronically Signed   By: Lupita RaiderJames  Green Jr M.D.   On: 09/05/2020 18:43        Scheduled Meds:  amLODipine  10 mg Oral Daily   atorvastatin  40 mg Oral QHS   cholecalciferol  2,000 Units Oral Daily   enoxaparin (LOVENOX) injection  40 mg Subcutaneous Q24H  feeding supplement  237 mL Oral BID BM   losartan  100 mg Oral Daily   multivitamin with minerals  1 tablet Oral Daily   sertraline  100 mg Oral Daily   Continuous Infusions:   LOS: 2 days    Time spent: 25 minutes    Tresa Moore, MD [facility] Pager [phone]  If 7PM-7AM, please contact night-coverage 09/07/2020, 1:30 PM

## 2020-09-07 NOTE — Consult Note (Addendum)
Consultation Note Date: 09/07/2020   Patient Name: [name]  DOB: 02/26/1930  MRN: [id number]  Age / Sex: 87 y.o., female  PCP: Tower, Wynelle Fanny, MD Referring Physician: Sidney Ace, MD  Reason for Consultation: Establishing goals of care  HPI/Patient Profile: [name] is a 87 y.o. female with medical history significant for chronic low back pain, hypertension, vascular dementia, history of CVA and frequent falls who was sent to the emergency room by EMS for evaluation of an unwitnessed fall.  Clinical Assessment and Goals of Care: Patient is resting in bed on her right side. Nursing is at bedside and states this is her favorite position due to pain. She has had a very poor appetite. Per nursing, she has been working on pain management today and so patient is no longer writhing in bed. Patient looks uncomfortable at this time but does not speak. She and TOC advise patient has HPOAs who are equal in decision maker. They are her nieces. Called to speak to Tammy.   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 She states patient has never been married or had children. She states patient has always wanted to do things her way, and they allow this at the facility to keep from upsetting her. She states she and her sister do not want their aunt to suffer.   She states they have spoken with the attending team with plans for D/C back to her facility with hospice to follow.   We discussed her diagnosis, poor prognosis, GOC, EOL wishes disposition and options.  Created space and opportunity for patient  to explore thoughts and feelings regarding current medical information.   A detailed discussion was had today regarding advanced directives.  Concepts specific to code status, artifical feeding and hydration, IV antibiotics and rehospitalization were discussed.  The difference between an aggressive  medical intervention path and a comfort care path was discussed.  Values and goals of care important to patient and family were attempted to be elicited.  Discussed limitations of medical interventions to prolong quality of life in some situations and discussed the concept of human mortality.  Discussed side effects of pain medications. Discussed her poor appetite. Discussed QOL. Nieces would like for her to return to [facility] if they are able to provide the symptom management she needs. If not, they do not want he to return there. Given patient's status, she is appropriate for hospice facility if family chooses.       I completed a MOST form today through [facility] with Tammy and the signed original was placed in the chart. A photocopy was placed in the chart to be scanned into EMR. The patient outlined their wishes for the following treatment decisions:  Cardiopulmonary Resuscitation: Do Not Attempt Resuscitation (DNR/No CPR)  Medical Interventions: Comfort Measures: Keep clean, warm, and dry. Use medication by any route, positioning, wound care, and other measures to relieve pain and suffering. Use oxygen, suction and manual treatment of airway obstruction as needed for comfort. Do  not transfer to the hospital unless comfort needs cannot be met in current location.  Antibiotics: No antibiotics (use other measures to relieve symptoms)  IV Fluids: No IV fluids (provide other measures to ensure comfort)  Feeding Tube: No feeding tube       SUMMARY OF RECOMMENDATIONS   Transitioning to hospice care with prognosis of 2 weeks or less.   Prognosis:  < 2 weeks Sacral fracture. Poor PO intake. Will need aggressive symptom management given her pain.  Appropriate for hospice facility if family chooses.         Primary Diagnoses: Present on Admission:  Essential hypertension  Vascular dementia ([place])  Osteopenia   I have reviewed the medical record, interviewed the patient and family, and  examined the patient. The following aspects are pertinent.  Past Medical History:  Diagnosis Date   Chronic pain    DDD (degenerative disc disease)    Diverticulosis    Hyperlipidemia    Hypertension    Osteopenia    Stroke ([place])    Syncope    Vascular dementia ([place])    Social History   Socioeconomic History   Marital status: Single    Spouse name: Not on file   Number of children: Not on file   Years of education: 12   Highest education level: Not on file  Occupational History   Occupation: retired  Tobacco Use   Smoking status: Never   Smokeless tobacco: Never  Vaping Use   Vaping Use: Never used  Substance and Sexual Activity   Alcohol use: No    Alcohol/week: 0.0 standard drinks   Drug use: No   Sexual activity: Not Currently  Other Topics Concern   Not on file  Social History Narrative   She lives alone in a [place] mobile home.   She worked as a Associate Professor at [employer] level of education:  12th grade   Social Determinants of Radio broadcast assistant Strain: Not on [employer] Insecurity: Not on file  Transportation Needs: Not on file  Physical Activity: Not on file  Stress: Not on file  Social Connections: Not on file   Family History  Problem Relation Age of Onset   Hypertension Father    COPD Father    Other Mother        Deceased, 57   Healthy Brother    Scheduled Meds:  amLODipine  10 mg Oral Daily   feeding supplement  237 mL Oral BID BM   losartan  100 mg Oral Daily   Continuous Infusions: PRN Meds:.acetaminophen **OR** acetaminophen, HYDROcodone-acetaminophen, morphine injection, ondansetron **OR** ondansetron (ZOFRAN) IV, senna-docusate Medications Prior to Admission:  Prior to Admission medications   Medication Sig Start Date End Date Taking? Authorizing Provider  amLODipine (NORVASC) 5 MG tablet Take 5 mg by mouth daily.   Yes [provider]  Cholecalciferol (VITAMIN D PO) Take  2,000 Units by mouth daily.   Yes [provider]  losartan (COZAAR) 100 MG tablet TAKE 1/2 TABLET (50 MG TOTAL) BY MOUTH DAILY. Patient taking differently: Take 100 mg by mouth daily. 02/25/17  Yes Tower, Wynelle Fanny, MD  Multiple Vitamin (MULTIVITAMIN ADULT) TABS Take 1 tablet by mouth daily.   Yes [provider]  sertraline (ZOLOFT) 100 MG tablet Take 1 tablet (100 mg total) by mouth daily. 07/19/19  Yes Tower, Wynelle Fanny, MD  acetaminophen (TYLENOL) 325 MG tablet Take 650 mg by mouth every 6 (six)  hours as needed.    [provider]  atorvastatin (LIPITOR) 40 MG tablet Take 1 tablet (40 mg total) by mouth at bedtime. 02/25/17 09/04/20  Tower, Wynelle Fanny, MD  loperamide (IMODIUM) 2 MG capsule Take 2 mg by mouth 2 (two) times daily. 08/08/20   [provider]   Allergies  Allergen Reactions   Celecoxib Other (See Comments)    affects kidneys   Codeine Nausea Only and Other (See Comments)    dizziness   Lisinopril Cough   Etodolac Rash   Review of Systems  Unable to perform ROS  Physical Exam Pulmonary:     Effort: Pulmonary effort is normal.  Neurological:     Mental Status: She is alert.    Vital Signs: BP 123/70 (BP Location: Left Arm)   Pulse 74   Temp 98.1 F (36.7 C)   Resp 15   Ht $R'5\' 3"'MX$  (1.6 m)   Wt 47.7 kg Comment: bed weight  SpO2 (!) 84%   BMI 18.62 kg/m  Pain Scale: PAINAD POSS *See Group Information*: 1-Acceptable,Awake and alert Pain Score: 10-Worst pain ever   SpO2: SpO2: (!) 84 % O2 Device:SpO2: (!) 84 % O2 Flow Rate: .   IO: Intake/output summary: No intake or output data in the 24 hours ending 09/07/20 1520  LBM: Last BM Date: 09/06/20 Baseline Weight: Weight: 54 kg Most recent weight: Weight: 47.7 kg (bed weight)        Time In: 2:30 Time Out: 3:20 Time Total: 50 min Greater than 50%  of this time was spent counseling and coordinating care related to the above assessment and plan.  Signed by: Asencion Gowda, NP    Please contact Palliative Medicine Team phone at [phone] for questions and concerns.  For individual provider: See Shea Evans

## 2020-09-08 DIAGNOSIS — Z515 Encounter for palliative care: Secondary | ICD-10-CM

## 2020-09-08 DIAGNOSIS — M8448XD Pathological fracture, other site, subsequent encounter for fracture with routine healing: Secondary | ICD-10-CM | POA: Diagnosis not present

## 2020-09-08 MED ORDER — OXYCODONE HCL 5 MG PO TABS: 5.0000 mg | ORAL_TABLET | ORAL | Status: DC

## 2020-09-08 MED ORDER — OXYCODONE HCL 5 MG PO TABS
7.5000 mg | ORAL_TABLET | Freq: Four times a day (QID) | ORAL | Status: DC
  Administered 2020-09-08 – 2020-09-13 (×19): 7.5 mg via ORAL
  Filled 2020-09-08 (×20): qty 2

## 2020-09-08 MED ORDER — CYCLOBENZAPRINE HCL 10 MG PO TABS: 5.0000 mg | ORAL_TABLET | Freq: Three times a day (TID) | ORAL | Status: DC | PRN

## 2020-09-08 NOTE — TOC Progression Note (Addendum)
Transition of Care [facility]) - Progression Note    Patient Details  Name: [name] MRN: [id number] Date of Birth: 11-29-1930  Transition of Care [facility]) CM/SW Contact  Barrie Dunker, RN Phone Number: 09/08/2020, 10:48 AM  Clinical Narrative:     Waynetta Sandy with [facility] called and stated due to the needs of the patient they will not be able to accept the patient back to the Memory care. Spoke with Tammy the niece and POA, she would like to find a hospice facility and would like to stay close but is agreeable to [place] of the [place], I called [facility] and let them know of the plan to go to Hospice facility, I reached out to [facility] and  left a VM requesting Drenda Freeze to call back         Expected Discharge Plan and Services                                                 Social Determinants of Health (SDOH) Interventions    Readmission Risk Interventions No flowsheet data found.

## 2020-09-08 NOTE — Progress Notes (Signed)
PROGRESS NOTE    [name]  JQB:[id number] DOB: October 20, 1930 DOA: 09/05/2020 PCP: Judy Pimple, MD   Brief Narrative:  87 y.o. female with medical history significant for Dementia, HTN, osteopenia and frequent falls who presents to the ED for the second time in 24 hours with a complaint of inability to ambulate/bear weight on her left extremity following a fall at her skilled nursing facility on 7/24.  Patient was initially evaluated in the emergency room on 7/25 and had extensive imaging with CT thoracolumbar spine after she complained of low back pain following a fall.  She was admitted to observation but was signed out AGAINST MEDICAL ADVICE by her niece later on that evening as patient had voiced no additional complaint.  She was taken back to the [facility] facility however, it was noted that patient was still having difficulty weightbearing and there was still suspicion for acute injury from her recent fall so she was sent back to the ED.  Patient continued to have intermittent complaints of low back pain.  There were no reported complaints of difficulty with bladder or bowel control and she was able to move her legs independently once lying down but just unable to weight-bear.  I discussed the case with orthopedic surgery.  No surgical intervention warranted.  Weightbearing as tolerated especially during transfers.  Ensure pain control.  Patient will need placement.    7/28: Had an extensive and productive conversation with the patient's niece and MPOA Synetta Fail.  Synetta Fail relayed that the patient was independent up until 2 years ago when she had a devastating fall and resultant cervical spine fracture.  She was hospitalized at [facility] and because of her advanced age and extreme surgical risk no surgical invention was pursued and the patient ended up discharging to skilled nursing facility for therapy.  Since that time she has had multiple falls and overall decreased level of functioning both physical  and mental.  The patient's niece states recently she has noted the patient to lay in bed all day, be very apathetic, refused to eat.  I did relay that her brain imaging is significant for age-related volume loss and her changes in behavior was likely a manifestation of advanced dementia.  In my opinion patient is appropriate for palliative care and hospice services.  I have offered this to the patient's niece who is in agreement but needs to speak to her sister and the other MPOA.  In the meantime will consult palliative care and will have TOC reach out to hospice liaison.  TOC and forms of [facility] memory care unit will not be able to accept back the patient due to her needs.  TOC looking for inpatient hospice facility.   Assessment & Plan:   Principal Problem:   Bilateral sacral insufficiency fracture Active Problems:   Essential hypertension   Osteopenia   Vascular dementia ([facility])   Frequent falls   Ambulatory dysfunction   Malnutrition of moderate degree  Bilateral sacral insufficiency fracture with difficulty weightbearing s/p fall on 7/24 Osteopenia/frequent falls Patient appears comfortable on my examination.  CT abdomen pelvis shows acute/subacute right sacral insufficiency fracture and possible left sacral insufficiency fracture.  Orthopedics has evaluated the films.  Recommendations as below. Plan: Pain control.  Patient now with plans to discharge to inpatient hospice facility once bed is found     Essential hypertension Improved control over interval Plan: Is current plans are to proceed with hospice referral we will discontinue home Cozaar.  Continue amlodipine for  just for blood pressure control     Vascular dementia [facility]) Patient is awake, alert to person only Per the patient's niece baseline mentation is poor and has been deteriorating.  Brain imaging with significant volume loss.  Plan to discharge to inpatient hospice facility   DVT prophylaxis: SQ Lovenox Code  Status: DNR Family Communication: Rich Reining [phone] on 7/28  Disposition Plan: Status is: Inpatient  Remains inpatient appropriate because:Inpatient level of care appropriate due to severity of illness  Dispo: The patient is from: ALF              Anticipated d/c is to: Inpatient hospice facility              Patient currently is not medically stable to d/c.   Difficult to place patient No   Bilateral sacral insufficiency fractures.  Recent functional decline, adult failure to thrive.  Appropriate for inpatient hospice facility.  TOC aware.  Searching for inpatient hospice bed.   Level of care: Med-Surg  Consultants:  Orthopedics Palliative care  Procedures: None  Antimicrobials:  None   Subjective: Seen and examined.  History limited by underlying dementia and hearing loss.  More comfortable this morning after pain has been addressed.  Objective: Vitals:   09/08/20 0054 09/08/20 0633 09/08/20 0814 09/08/20 1157  BP: 125/80 (!) 149/75 (!) 161/95 (!) 106/54  Pulse: 68 (!) 58 75 74  Resp: 16 14 15 15   Temp: 97.8 F (36.6 C) 97.6 F (36.4 C) (!) 96.1 F (35.6 C) 98.3 F (36.8 C)  TempSrc:   Axillary   SpO2: 95% 95% 96% 100%  Weight:      Height:        Intake/Output Summary (Last 24 hours) at 09/08/2020 1343 Last data filed at 09/07/2020 1903 Gross per 24 hour  Intake 0 ml  Output --  Net 0 ml   Filed Weights   09/05/20 1518 09/05/20 1523 09/06/20 1101  Weight: 54 kg 54 kg 47.7 kg    Examination:  General exam: No acute distress.  Frail and chronically ill Respiratory system: Bibasilar fine crackles.  Normal work of breathing.  Room air Cardiovascular system: S1-S2, regular rate and rhythm, no murmurs, no pedal edema  gastrointestinal system: Scaphoid, nontender, nondistended, normal bowel sounds Central nervous system: Awake, oriented x1, no focal deficits Extremities: Decreased power bilateral lower extremities, gait not assessed Skin:  Thin and pale, various excoriations Psychiatry: Judgement and insight appear impaired. Mood & affect flattened.     Data Reviewed: I have personally reviewed following labs and imaging studies  CBC: Recent Labs  Lab 09/04/20 0726 09/06/20 0609  WBC 7.4 7.3  NEUTROABS  --  4.5  HGB 12.6 11.7*  HCT 36.7 33.5*  MCV 99.5 97.4  PLT 160 154   Basic Metabolic Panel: Recent Labs  Lab 09/04/20 0726 09/06/20 0609  NA 140 138  K 3.6 3.6  CL 105 103  CO2 26 27  GLUCOSE 111* 118*  BUN 24* 25*  CREATININE 0.82 0.70  CALCIUM 10.2 9.9   GFR: Estimated Creatinine Clearance: 36.6 mL/min (by C-G formula based on SCr of 0.7 mg/dL). Liver Function Tests: Recent Labs  Lab 09/06/20 0609  AST 23  ALT 24  ALKPHOS 114  BILITOT 0.7  PROT 6.2*  ALBUMIN 3.2*   No results for input(s): LIPASE, AMYLASE in the last 168 hours. No results for input(s): AMMONIA in the last 168 hours. Coagulation Profile: No results for input(s): INR, PROTIME in the last 168  hours. Cardiac Enzymes: Recent Labs  Lab 09/04/20 0726  CKTOTAL 64   BNP (last 3 results) No results for input(s): PROBNP in the last 8760 hours. HbA1C: No results for input(s): HGBA1C in the last 72 hours. CBG: No results for input(s): GLUCAP in the last 168 hours. Lipid Profile: No results for input(s): CHOL, HDL, LDLCALC, TRIG, CHOLHDL, LDLDIRECT in the last 72 hours. Thyroid Function Tests: No results for input(s): TSH, T4TOTAL, FREET4, T3FREE, THYROIDAB in the last 72 hours. Anemia Panel: No results for input(s): VITAMINB12, FOLATE, FERRITIN, TIBC, IRON, RETICCTPCT in the last 72 hours. Sepsis Labs: No results for input(s): PROCALCITON, LATICACIDVEN in the last 168 hours.  Recent Results (from the past 240 hour(s))  Resp Panel by RT-PCR (Flu A&B, Covid) Nasopharyngeal Swab     Status: None   Collection Time: 09/04/20  3:03 PM   Specimen: Nasopharyngeal Swab; Nasopharyngeal(NP) swabs in vial transport medium  Result  Value Ref Range Status   SARS Coronavirus 2 by RT PCR NEGATIVE NEGATIVE Final    Comment: (NOTE) SARS-CoV-2 target nucleic acids are NOT DETECTED.  The SARS-CoV-2 RNA is generally detectable in upper respiratory specimens during the acute phase of infection. The lowest concentration of SARS-CoV-2 viral copies this assay can detect is 138 copies/mL. A negative result does not preclude SARS-Cov-2 infection and should not be used as the sole basis for treatment or other patient management decisions. A negative result may occur with  improper specimen collection/handling, submission of specimen other than nasopharyngeal swab, presence of viral mutation(s) within the areas targeted by this assay, and inadequate number of viral copies(<138 copies/mL). A negative result must be combined with clinical observations, patient history, and epidemiological information. The expected result is Negative.  Fact Sheet for Patients:  BloggerCourse.comhttps://www.fda.gov/media/152166/download  Fact Sheet for Healthcare Providers:  SeriousBroker.ithttps://www.fda.gov/media/152162/download  This test is no t yet approved or cleared by the [place] FDA and  has been authorized for detection and/or diagnosis of SARS-CoV-2 by FDA under an Emergency Use Authorization (EUA). This EUA will remain  in effect (meaning this test can be used) for the duration of the COVID-19 declaration under Section 564(b)(1) of the Act, 21 U.S.C.section 360bbb-3(b)(1), unless the authorization is terminated  or revoked sooner.       Influenza A by PCR NEGATIVE NEGATIVE Final   Influenza B by PCR NEGATIVE NEGATIVE Final    Comment: (NOTE) The Xpert Xpress SARS-CoV-2/FLU/RSV plus assay is intended as an aid in the diagnosis of influenza from Nasopharyngeal swab specimens and should not be used as a sole basis for treatment. Nasal washings and aspirates are unacceptable for Xpert Xpress SARS-CoV-2/FLU/RSV testing.  Fact Sheet for  Patients: BloggerCourse.comhttps://www.fda.gov/media/152166/download  Fact Sheet for Healthcare Providers: SeriousBroker.ithttps://www.fda.gov/media/152162/download  This test is not yet approved or cleared by the [place] FDA and has been authorized for detection and/or diagnosis of SARS-CoV-2 by FDA under an Emergency Use Authorization (EUA). This EUA will remain in effect (meaning this test can be used) for the duration of the COVID-19 declaration under Section 564(b)(1) of the Act, 21 U.S.C. section 360bbb-3(b)(1), unless the authorization is terminated or revoked.  Performed at [facility], [address] [place], [place] [address]          Radiology Studies: No results found.      Scheduled Meds:  acetaminophen  500 mg Oral Q6H   amLODipine  10 mg Oral Daily   feeding supplement  237 mL Oral BID BM   gabapentin  100 mg Oral  BID   losartan  100 mg Oral Daily   oxyCODONE  5 mg Oral Q6H   Continuous Infusions:   LOS: 3 days    Time spent: 25 minutes    Tresa Moore, MD [facility] Pager [phone]  If 7PM-7AM, please contact night-coverage 09/08/2020, 1:43 PM

## 2020-09-08 NOTE — TOC Progression Note (Signed)
Transition of Care [facility]) - Progression Note    Patient Details  Name: [name] MRN: [id number] Date of Birth: 1930/10/13  [facility]) CM/SW Contact  Barrie Dunker, RN Phone Number: 09/08/2020, 4:25 PM  Clinical Narrative:     [place] hospice called and stated due to her taking most of her meds By mouth she would not meet their criteria for a hospice bed in their facility, if things change they are happy to review again, they notified her Niece Tammy       Expected Discharge Plan and Services                                                 Social Determinants of Health (SDOH) Interventions    Readmission Risk Interventions No flowsheet data found.

## 2020-09-08 NOTE — TOC Progression Note (Signed)
Transition of Care [facility]) - Progression Note    Patient Details  Name: [name] MRN: [id number] Date of Birth: 08/05/1930  Transition of Care  [facility]) CM/SW Contact  Barrie Dunker, RN Phone Number: 09/08/2020, 1:43 PM  Clinical Narrative:   Sherron Monday with Hillary at [facility], she will take a look and let me know if they can offer a bed and where         Expected Discharge Plan and Services                                                 Social Determinants of Health (SDOH) Interventions    Readmission Risk Interventions No flowsheet data found.

## 2020-09-08 NOTE — TOC Progression Note (Signed)
Transition of Care [facility]) - Progression Note    Patient Details  Name: [name] MRN: [id number] Date of Birth: 03-18-1930  Transition of Care [facility]) CM/SW Contact  Barrie Dunker, RN Phone Number: 09/08/2020, 9:55 AM  Clinical Narrative:     Spoke with the Niece Tammy, she is going to get in touch with [facility] to see if they are able to accept the patient back with [facility] following, I explained if not then the NP with Palliative has recommended a hospice facility, I notified her of the [place] locations as well as [place] and [place], the patient is [facility]. Awaiting a call back.       Expected Discharge Plan and Services                                                 Social Determinants of Health (SDOH) Interventions    Readmission Risk Interventions No flowsheet data found.

## 2020-09-08 NOTE — Progress Notes (Signed)
Daily Progress Note   Patient Name: [name]       Date: 09/08/2020 DOB: [birth year]  Age: 87 y.o. MRN#: [id number] Attending Physician: Tresa Moore, MD Primary Care Physician: Tower, Audrie Gallus, MD Admit Date: 09/05/2020  Reason for Consultation/Follow-up: Terminal Care  Subjective: Patient is resting in bed. Upon my entry she tried to reposition herself, and grimaced and moaned out. She states if she tries to move or reposition, she has a lot of pain, and so she does not want to move. Per MAR, no PRN medication has been given. Scheduled Oxycodone dose changed from 5mg  to 7.5mg  q 6 hrs, and hopefully this will help.   Please provide PRN pain medication for pain that is not relieved by scheduled medications, including functional pain (pain when she moves).  Oxycodone scheduled and PRN in place for sharp pain. Morphine for breakthrough sharp pain. Flexeril in place for muscle spasms. Neurontin in place for shooting/electric, neuropathic pain.     Length of Stay: 3  Current Medications: Scheduled Meds:   acetaminophen  500 mg Oral Q6H   amLODipine  10 mg Oral Daily   feeding supplement  237 mL Oral BID BM   gabapentin  100 mg Oral BID   oxyCODONE  5 mg Oral See admin instructions    Continuous Infusions:   PRN Meds: acetaminophen **OR** acetaminophen, morphine injection, ondansetron **OR** ondansetron (ZOFRAN) IV, oxyCODONE, senna-docusate  Physical Exam Pulmonary:     Effort: Pulmonary effort is normal.  Neurological:     Mental Status: She is alert.            Vital Signs: BP (!) 106/54 (BP Location: Right Arm)   Pulse 74   Temp 98.3 F (36.8 C)   Resp 15   Ht 5\' 3"  (1.6 m)   Wt 47.7 kg Comment: bed weight  SpO2 100%   BMI 18.62 kg/m  SpO2: SpO2: 100  % O2 Device: O2 Device: Room Air O2 Flow Rate:    Intake/output summary:  Intake/Output Summary (Last 24 hours) at 09/08/2020 1358 Last data filed at 09/08/2020 1300 Gross per 24 hour  Intake 0 ml  Output --  Net 0 ml   LBM: Last BM Date: 09/06/20 Baseline Weight: Weight: 54 kg Most recent weight: Weight: 47.7 kg (bed weight)  Patient Active Problem List   Diagnosis Date Noted   Malnutrition of moderate degree 09/06/2020   Bilateral sacral insufficiency fracture 09/05/2020   Ambulatory dysfunction 09/05/2020   Fall 09/04/2020   Frequent falls 09/04/2020   Chronic pain    Screening-pulmonary TB 05/20/2019   Cervical spine fracture (HCC) 12/18/2017   Weight loss 12/18/2017   Vascular dementia (HCC) 12/18/2017   Hearing loss 01/09/2016   Routine general medical examination at a health care facility 12/31/2015   Dysuria 09/29/2015   Anxiety and depression 09/21/2014   Pedal edema 06/22/2014   Encounter for Medicare annual wellness exam 03/18/2014   Retinal vein occlusion 03/18/2014   B12 deficiency 03/15/2014   Colon cancer screening 04/19/2013   At risk for falling 12/02/2011   Hyperparathyroidism (HCC) 11/20/2011   Back pain, thoracic 09/30/2011   Scoliosis 09/30/2011   Degenerative disc disease, lumbar 09/30/2011   Renal insufficiency 05/27/2011   Syncope 12/05/2010   Cerebral artery occlusion with cerebral infarction (HCC) 02/26/2008   Hyperlipidemia 05/12/2006   Essential hypertension 05/12/2006   DIVERTICULOSIS, COLON 05/12/2006   Osteopenia 05/12/2006    Palliative Care Assessment & Plan   Recommendations/Plan: Scheduled Oxycodone increased to 7.5mg  q 6 hours to help with functional pain. Have discussed this plan with attending MD who will manage her symptoms over the weekend.     Code Status:    Code Status Orders  (From admission, onward)           Start     Ordered   09/05/20 2124  Do not attempt resuscitation (DNR)  Continuous        Question Answer Comment  In the event of cardiac or respiratory ARREST Do not call a "code blue"   In the event of cardiac or respiratory ARREST Do not perform Intubation, CPR, defibrillation or ACLS   In the event of cardiac or respiratory ARREST Use medication by any route, position, wound care, and other measures to relive pain and suffering. May use oxygen, suction and manual treatment of airway obstruction as needed for comfort.   Comments Code status was discussed with patient's niece Satira Mccallum over the phone and patient is a DNR.      09/05/20 2126           Code Status History     Date Active Date Inactive Code Status Order ID Comments User Context   09/04/2020 1537 09/05/2020 0248 DNR [id number]  Lucile Shutters, MD ED   09/04/2020 1518 09/04/2020 1537 DNR [id number]  Lucile Shutters, MD ED   11/17/2011 0147 11/21/2011 1714 Full Code [id number]  Ron Parker, MD ED      Advance Directive Documentation    Flowsheet Row Most Recent Value  Type of Advance Directive Out of facility DNR (pink MOST or yellow form)  Pre-existing out of facility DNR order (yellow form or pink MOST form) --  "MOST" Form in Place? --       Prognosis:  < 2 weeks    Care plan was discussed with primary MD.   Thank you for allowing the Palliative Medicine Team to assist in the care of this patient.       Total Time 35 min Prolonged Time Billed  no       Greater than 50%  of this time was spent counseling and coordinating care related to the above assessment and plan.  Morton Stall, NP  Please contact Palliative Medicine Team phone at [phone] for questions and concerns.

## 2020-09-08 NOTE — TOC Progression Note (Signed)
Transition of Care [facility]) - Progression Note    Patient Details  Name: [name] MRN: [id number] Date of Birth: 1930-07-21  Transition of Care [facility]) CM/SW Contact  Barrie Dunker, RN Phone Number: 09/08/2020, 9:03 AM  Clinical Narrative:     [place] and left a Message for [place] requesting a call back [phone]       Expected Discharge Plan and Services                                                 Social Determinants of Health (SDOH) Interventions    Readmission Risk Interventions No flowsheet data found.

## 2020-09-08 NOTE — Care Management Important Message (Signed)
Important Message  Patient Details  Name: CHRISTALYNN BOISE MRN: [id number] Date of Birth: 03-Aug-1930   Medicare Important Message Given:  Other (see comment)  Patient is now on comfort care and out of respect for the patient and family no Important Message from [facility] given.  Olegario Messier A Zaydrian Batta 09/08/2020, 9:28 AM

## 2020-09-09 DIAGNOSIS — M8448XD Pathological fracture, other site, subsequent encounter for fracture with routine healing: Secondary | ICD-10-CM | POA: Diagnosis not present

## 2020-09-09 NOTE — Progress Notes (Signed)
PROGRESS NOTE    [name]  WUJ:[id number] DOB: 06/07/1930 DOA: 09/05/2020 PCP: Judy Pimple, MD   Brief Narrative:  87 y.o. female with medical history significant for Dementia, HTN, osteopenia and frequent falls who presents to the ED for the second time in 24 hours with a complaint of inability to ambulate/bear weight on her left extremity following a fall at her skilled nursing facility on 7/24.  Patient was initially evaluated in the emergency room on 7/25 and had extensive imaging with CT thoracolumbar spine after she complained of low back pain following a fall.  She was admitted to observation but was signed out AGAINST MEDICAL ADVICE by her niece later on that evening as patient had voiced no additional complaint.  She was taken back to the [place] [place] facility however, it was noted that patient was still having difficulty weightbearing and there was still suspicion for acute injury from her recent fall so she was sent back to the ED.  Patient continued to have intermittent complaints of low back pain.  There were no reported complaints of difficulty with bladder or bowel control and she was able to move her legs independently once lying down but just unable to weight-bear.  I discussed the case with orthopedic surgery.  No surgical intervention warranted.  Weightbearing as tolerated especially during transfers.  Ensure pain control.  Patient will need placement.    7/28: Had an extensive and productive conversation with the patient's niece and MPOA Synetta Fail.  Synetta Fail relayed that the patient was independent up until 2 years ago when she had a devastating fall and resultant cervical spine fracture.  She was hospitalized at [facility] and because of her advanced age and extreme surgical risk no surgical invention was pursued and the patient ended up discharging to skilled nursing facility for therapy.  Since that time she has had multiple falls and overall decreased level of functioning both physical  and mental.  The patient's niece states recently she has noted the patient to lay in bed all day, be very apathetic, refused to eat.  I did relay that her brain imaging is significant for age-related volume loss and her changes in behavior was likely a manifestation of advanced dementia.  In my opinion patient is appropriate for palliative care and hospice services.  I have offered this to the patient's niece who is in agreement but needs to speak to her sister and the other MPOA.  In the meantime will consult palliative care and will have TOC reach out to hospice liaison.  TOC and forms of [facility] memory care unit will not be able to accept back the patient due to her needs.  TOC looking for inpatient hospice facility.  [facility] states they are unable to accept due to patient taking most of her meds by mouth.  TOC aware looking for alternative options   Assessment & Plan:   Principal Problem:   Bilateral sacral insufficiency fracture Active Problems:   Essential hypertension   Osteopenia   Vascular dementia (HCC)   Frequent falls   Ambulatory dysfunction   Malnutrition of moderate degree  Bilateral sacral insufficiency fracture with difficulty weightbearing s/p fall on 7/24 Osteopenia/frequent falls Patient appears comfortable on my examination.  CT abdomen pelvis shows acute/subacute right sacral insufficiency fracture and possible left sacral insufficiency fracture.  Orthopedics has evaluated the films.  Recommendations as below. Plan: Pain control.  Patient now with plans to discharge to inpatient hospice facility once bed is found  Essential hypertension Improved control over interval Plan: If current plans are to proceed with hospice referral we will discontinue home Cozaar.  Continue amlodipine for now.  Ensure pain control.     Vascular dementia [facility]) Patient is awake, alert to person only Per the patient's niece baseline mentation is poor and has been  deteriorating.  Brain imaging with significant volume loss.  Plan to discharge to inpatient hospice facility   DVT prophylaxis: SQ Lovenox Code Status: DNR Family Communication: Rich Reining [phone] on 7/28  Disposition Plan: Status is: Inpatient  Remains inpatient appropriate because:Inpatient level of care appropriate due to severity of illness  Dispo: The patient is from: ALF              Anticipated d/c is to: Inpatient hospice facility              Patient currently is not medically stable to d/c.   Difficult to place patient No   Bilateral sacral insufficiency fractures.  Recent functional decline, adult failure to thrive.  Appropriate for inpatient hospice facility.  TOC aware.  Searching for inpatient hospice bed.   Level of care: Med-Surg  Consultants:  Orthopedics Palliative care  Procedures: None  Antimicrobials:  None   Subjective: Seen and examined.  History limited by underlying dementia.  Resting in bed.  More comfortable after pain regimen adjusted  Objective: Vitals:   09/08/20 2036 09/09/20 0543 09/09/20 0826 09/09/20 1320  BP: 127/76 137/87 (!) 154/84 111/70  Pulse: 77 60 60 63  Resp: 16 16 16 16   Temp: 97.6 F (36.4 C) 97.7 F (36.5 C) 98.1 F (36.7 C) (!) 97.1 F (36.2 C)  TempSrc: Oral Oral    SpO2: 96% 96% 99% 96%  Weight:      Height:        Intake/Output Summary (Last 24 hours) at 09/09/2020 1327 Last data filed at 09/09/2020 09/11/2020 Gross per 24 hour  Intake --  Output 0 ml  Net 0 ml   Filed Weights   09/05/20 1518 09/05/20 1523 09/06/20 1101  Weight: 54 kg 54 kg 47.7 kg    Examination:  General exam: No acute distress.  Frail and chronically ill Respiratory system: Bibasilar fine crackles.  Normal work of breathing.  Room air Cardiovascular system: S1-S2, regular rate and rhythm, no murmurs, no pedal edema  gastrointestinal system: Scaphoid, nontender, nondistended, normal bowel sounds Central nervous system: Awake,  oriented x1, no focal deficits Extremities: Decreased power bilateral lower extremities, gait not assessed Skin: Thin and pale, various excoriations Psychiatry: Judgement and insight appear impaired. Mood & affect flattened.     Data Reviewed: I have personally reviewed following labs and imaging studies  CBC: Recent Labs  Lab 09/04/20 0726 09/06/20 0609  WBC 7.4 7.3  NEUTROABS  --  4.5  HGB 12.6 11.7*  HCT 36.7 33.5*  MCV 99.5 97.4  PLT 160 154   Basic Metabolic Panel: Recent Labs  Lab 09/04/20 0726 09/06/20 0609  NA 140 138  K 3.6 3.6  CL 105 103  CO2 26 27  GLUCOSE 111* 118*  BUN 24* 25*  CREATININE 0.82 0.70  CALCIUM 10.2 9.9   GFR: Estimated Creatinine Clearance: 36.6 mL/min (by C-G formula based on SCr of 0.7 mg/dL). Liver Function Tests: Recent Labs  Lab 09/06/20 0609  AST 23  ALT 24  ALKPHOS 114  BILITOT 0.7  PROT 6.2*  ALBUMIN 3.2*   No results for input(s): LIPASE, AMYLASE in the last 168 hours. No results  for input(s): AMMONIA in the last 168 hours. Coagulation Profile: No results for input(s): INR, PROTIME in the last 168 hours. Cardiac Enzymes: Recent Labs  Lab 09/04/20 0726  CKTOTAL 64   BNP (last 3 results) No results for input(s): PROBNP in the last 8760 hours. HbA1C: No results for input(s): HGBA1C in the last 72 hours. CBG: No results for input(s): GLUCAP in the last 168 hours. Lipid Profile: No results for input(s): CHOL, HDL, LDLCALC, TRIG, CHOLHDL, LDLDIRECT in the last 72 hours. Thyroid Function Tests: No results for input(s): TSH, T4TOTAL, FREET4, T3FREE, THYROIDAB in the last 72 hours. Anemia Panel: No results for input(s): VITAMINB12, FOLATE, FERRITIN, TIBC, IRON, RETICCTPCT in the last 72 hours. Sepsis Labs: No results for input(s): PROCALCITON, LATICACIDVEN in the last 168 hours.  Recent Results (from the past 240 hour(s))  Resp Panel by RT-PCR (Flu A&B, Covid) Nasopharyngeal Swab     Status: None   Collection Time:  09/04/20  3:03 PM   Specimen: Nasopharyngeal Swab; Nasopharyngeal(NP) swabs in vial transport medium  Result Value Ref Range Status   SARS Coronavirus 2 by RT PCR NEGATIVE NEGATIVE Final    Comment: (NOTE) SARS-CoV-2 target nucleic acids are NOT DETECTED.  The SARS-CoV-2 RNA is generally detectable in upper respiratory specimens during the acute phase of infection. The lowest concentration of SARS-CoV-2 viral copies this assay can detect is 138 copies/mL. A negative result does not preclude SARS-Cov-2 infection and should not be used as the sole basis for treatment or other patient management decisions. A negative result may occur with  improper specimen collection/handling, submission of specimen other than nasopharyngeal swab, presence of viral mutation(s) within the areas targeted by this assay, and inadequate number of viral copies(<138 copies/mL). A negative result must be combined with clinical observations, patient history, and epidemiological information. The expected result is Negative.  Fact Sheet for Patients:  BloggerCourse.comhttps://www.fda.gov/media/152166/download  Fact Sheet for Healthcare Providers:  SeriousBroker.ithttps://www.fda.gov/media/152162/download  This test is no t yet approved or cleared by the [place] FDA and  has been authorized for detection and/or diagnosis of SARS-CoV-2 by FDA under an Emergency Use Authorization (EUA). This EUA will remain  in effect (meaning this test can be used) for the duration of the COVID-19 declaration under Section 564(b)(1) of the Act, 21 U.S.C.section 360bbb-3(b)(1), unless the authorization is terminated  or revoked sooner.       Influenza A by PCR NEGATIVE NEGATIVE Final   Influenza B by PCR NEGATIVE NEGATIVE Final    Comment: (NOTE) The Xpert Xpress SARS-CoV-2/FLU/RSV plus assay is intended as an aid in the diagnosis of influenza from Nasopharyngeal swab specimens and should not be used as a sole basis for treatment. Nasal washings  and aspirates are unacceptable for Xpert Xpress SARS-CoV-2/FLU/RSV testing.  Fact Sheet for Patients: BloggerCourse.comhttps://www.fda.gov/media/152166/download  Fact Sheet for Healthcare Providers: SeriousBroker.ithttps://www.fda.gov/media/152162/download  This test is not yet approved or cleared by the [place] FDA and has been authorized for detection and/or diagnosis of SARS-CoV-2 by FDA under an Emergency Use Authorization (EUA). This EUA will remain in effect (meaning this test can be used) for the duration of the COVID-19 declaration under Section 564(b)(1) of the Act, 21 U.S.C. section 360bbb-3(b)(1), unless the authorization is terminated or revoked.  Performed at [facility], [address] [place], [place] [address]          Radiology Studies: No results found.      Scheduled Meds:  acetaminophen  500 mg Oral Q6H   amLODipine  10  mg Oral Daily   feeding supplement  237 mL Oral BID BM   gabapentin  100 mg Oral BID   oxyCODONE  7.5 mg Oral Q6H   Continuous Infusions:   LOS: 4 days    Time spent: 15 minutes    Tresa Moore, MD [facility] Pager [phone]  If 7PM-7AM, please contact night-coverage 09/09/2020, 1:27 PM

## 2020-09-10 DIAGNOSIS — M8448XD Pathological fracture, other site, subsequent encounter for fracture with routine healing: Secondary | ICD-10-CM | POA: Diagnosis not present

## 2020-09-10 NOTE — TOC Progression Note (Signed)
Transition of Care [facility]) - Progression Note    Patient Details  Name: [name] MRN: [id number] Date of Birth: Apr 11, 1930  [facility] CM/SW Contact  Barrie Dunker, RN Phone Number: 09/10/2020, 10:23 AM  Clinical Narrative:      [facility] to see if they would be able to accept the patient at the hospice facility, they gave a fax number to fax over a referral, fax number is   [phone], faxed over the referral requesting a bed    Expected Discharge Plan and Services                                                 Social Determinants of Health (SDOH) Interventions    Readmission Risk Interventions No flowsheet data found.

## 2020-09-10 NOTE — Progress Notes (Signed)
PROGRESS NOTE    [name]  KXF:[id number] DOB: 10-29-1930 DOA: 09/05/2020 PCP: Judy Pimple, MD   Brief Narrative:  87 y.o. female with medical history significant for Dementia, HTN, osteopenia and frequent falls who presents to the ED for the second time in 24 hours with a complaint of inability to ambulate/bear weight on her left extremity following a fall at her skilled nursing facility on 7/24.  Patient was initially evaluated in the emergency room on 7/25 and had extensive imaging with CT thoracolumbar spine after she complained of low back pain following a fall.  She was admitted to observation but was signed out AGAINST MEDICAL ADVICE by her niece later on that evening as patient had voiced no additional complaint.  She was taken back to the [facility] facility however, it was noted that patient was still having difficulty weightbearing and there was still suspicion for acute injury from her recent fall so she was sent back to the ED.  Patient continued to have intermittent complaints of low back pain.  There were no reported complaints of difficulty with bladder or bowel control and she was able to move her legs independently once lying down but just unable to weight-bear.  I discussed the case with orthopedic surgery.  No surgical intervention warranted.  Weightbearing as tolerated especially during transfers.  Ensure pain control.  Patient will need placement.    7/28: Had an extensive and productive conversation with the patient's niece and MPOA Synetta Fail.  Synetta Fail relayed that the patient was independent up until 2 years ago when she had a devastating fall and resultant cervical spine fracture.  She was hospitalized at [facility] and because of her advanced age and extreme surgical risk no surgical invention was pursued and the patient ended up discharging to skilled nursing facility for therapy.  Since that time she has had multiple falls and overall decreased level of functioning both physical  and mental.  The patient's niece states recently she has noted the patient to lay in bed all day, be very apathetic, refused to eat.  I did relay that her brain imaging is significant for age-related volume loss and her changes in behavior was likely a manifestation of advanced dementia.  In my opinion patient is appropriate for palliative care and hospice services.  I have offered this to the patient's niece who is in agreement but needs to speak to her sister and the other MPOA.  In the meantime will consult palliative care and will have TOC reach out to hospice liaison.  TOC and forms of [facility] care unit will not be able to accept back the patient due to her needs.  TOC looking for inpatient hospice facility.  [facility] states they are unable to accept due to patient taking most of her meds by mouth.  TOC aware looking for alternative options.  Reaching out to [facility] to see if they will accept patient   Assessment & Plan:   Principal Problem:   Bilateral sacral insufficiency fracture Active Problems:   Essential hypertension   Osteopenia   Vascular dementia (HCC)   Frequent falls   Ambulatory dysfunction   Malnutrition of moderate degree  Bilateral sacral insufficiency fracture with difficulty weightbearing s/p fall on 7/24 Osteopenia/frequent falls Patient appears comfortable on my examination.  CT abdomen pelvis shows acute/subacute right sacral insufficiency fracture and possible left sacral insufficiency fracture.  Orthopedics has evaluated the films.  Recommendations as below. Plan: Multimodal pain control.  Scheduled oxycodone 7.5  mg every 6 hours.  As needed morphine, Flexeril, Neurontin.  Patient has a poor prognosis.  Significant pain.  Poor p.o. intake.  Appropriate for inpatient hospice facility.     Essential hypertension Improved control over interval Plan: If current plans are to proceed with hospice referral we will discontinue home Cozaar.   Continue amlodipine for now.  Ensure pain control.     Vascular dementia [facility]) Patient is awake, alert to person only Per the patient's niece baseline mentation is poor and has been deteriorating.  Brain imaging with significant volume loss.  Plan to discharge to inpatient hospice facility   DVT prophylaxis: SQ Lovenox Code Status: DNR Family Communication: Rich Reining [phone] on 7/28  Disposition Plan: Status is: Inpatient  Remains inpatient appropriate because:Inpatient level of care appropriate due to severity of illness  Dispo: The patient is from: ALF              Anticipated d/c is to: Inpatient hospice facility              Patient currently is not medically stable to d/c.   Difficult to place patient No   Bilateral sacral insufficiency fractures.  Recent functional decline, adult failure to thrive.  Appropriate for inpatient hospice facility.  TOC aware.  Searching for inpatient hospice bed.   Level of care: Med-Surg  Consultants:  Orthopedics Palliative care  Procedures: None  Antimicrobials:  None   Subjective: Seen and examined.  History limited by underlying dementia.  Resting in bed.  More comfortable after pain regimen adjusted  Objective: Vitals:   09/09/20 2000 09/10/20 0418 09/10/20 0503 09/10/20 0839  BP: (!) 181/83 (!) 160/126 140/90 (!) 151/98  Pulse: 60 93 91 92  Resp: 20 20  14   Temp: (!) 97 F (36.1 C) 98.7 F (37.1 C)  98 F (36.7 C)  TempSrc:  Axillary    SpO2: 100% 99%  98%  Weight:      Height:        Intake/Output Summary (Last 24 hours) at 09/10/2020 1344 Last data filed at 09/09/2020 1848 Gross per 24 hour  Intake 0 ml  Output --  Net 0 ml   Filed Weights   09/05/20 1518 09/05/20 1523 09/06/20 1101  Weight: 54 kg 54 kg 47.7 kg    Examination:  General exam: No acute distress.  Frail and chronically ill Respiratory system: Bibasilar fine crackles.  Normal work of breathing.  Room air Cardiovascular system:  S1-S2, regular rate and rhythm, no murmurs, no pedal edema  gastrointestinal system: Scaphoid, nontender, nondistended, normal bowel sounds Central nervous system: Awake, oriented x1, no focal deficits Extremities: Decreased power bilateral lower extremities, gait not assessed Skin: Thin and pale, various excoriations Psychiatry: Judgement and insight appear impaired. Mood & affect flattened.     Data Reviewed: I have personally reviewed following labs and imaging studies  CBC: Recent Labs  Lab 09/04/20 0726 09/06/20 0609  WBC 7.4 7.3  NEUTROABS  --  4.5  HGB 12.6 11.7*  HCT 36.7 33.5*  MCV 99.5 97.4  PLT 160 154   Basic Metabolic Panel: Recent Labs  Lab 09/04/20 0726 09/06/20 0609  NA 140 138  K 3.6 3.6  CL 105 103  CO2 26 27  GLUCOSE 111* 118*  BUN 24* 25*  CREATININE 0.82 0.70  CALCIUM 10.2 9.9   GFR: Estimated Creatinine Clearance: 36.6 mL/min (by C-G formula based on SCr of 0.7 mg/dL). Liver Function Tests: Recent Labs  Lab 09/06/20 [phone]  AST 23  ALT 24  ALKPHOS 114  BILITOT 0.7  PROT 6.2*  ALBUMIN 3.2*   No results for input(s): LIPASE, AMYLASE in the last 168 hours. No results for input(s): AMMONIA in the last 168 hours. Coagulation Profile: No results for input(s): INR, PROTIME in the last 168 hours. Cardiac Enzymes: Recent Labs  Lab 09/04/20 0726  CKTOTAL 64   BNP (last 3 results) No results for input(s): PROBNP in the last 8760 hours. HbA1C: No results for input(s): HGBA1C in the last 72 hours. CBG: No results for input(s): GLUCAP in the last 168 hours. Lipid Profile: No results for input(s): CHOL, HDL, LDLCALC, TRIG, CHOLHDL, LDLDIRECT in the last 72 hours. Thyroid Function Tests: No results for input(s): TSH, T4TOTAL, FREET4, T3FREE, THYROIDAB in the last 72 hours. Anemia Panel: No results for input(s): VITAMINB12, FOLATE, FERRITIN, TIBC, IRON, RETICCTPCT in the last 72 hours. Sepsis Labs: No results for input(s): PROCALCITON,  LATICACIDVEN in the last 168 hours.  Recent Results (from the past 240 hour(s))  Resp Panel by RT-PCR (Flu A&B, Covid) Nasopharyngeal Swab     Status: None   Collection Time: 09/04/20  3:03 PM   Specimen: Nasopharyngeal Swab; Nasopharyngeal(NP) swabs in vial transport medium  Result Value Ref Range Status   SARS Coronavirus 2 by RT PCR NEGATIVE NEGATIVE Final    Comment: (NOTE) SARS-CoV-2 target nucleic acids are NOT DETECTED.  The SARS-CoV-2 RNA is generally detectable in upper respiratory specimens during the acute phase of infection. The lowest concentration of SARS-CoV-2 viral copies this assay can detect is 138 copies/mL. A negative result does not preclude SARS-Cov-2 infection and should not be used as the sole basis for treatment or other patient management decisions. A negative result may occur with  improper specimen collection/handling, submission of specimen other than nasopharyngeal swab, presence of viral mutation(s) within the areas targeted by this assay, and inadequate number of viral copies(<138 copies/mL). A negative result must be combined with clinical observations, patient history, and epidemiological information. The expected result is Negative.  Fact Sheet for Patients:  BloggerCourse.comhttps://www.fda.gov/media/152166/download  Fact Sheet for Healthcare Providers:  SeriousBroker.ithttps://www.fda.gov/media/152162/download  This test is no t yet approved or cleared by the [place] FDA and  has been authorized for detection and/or diagnosis of SARS-CoV-2 by FDA under an Emergency Use Authorization (EUA). This EUA will remain  in effect (meaning this test can be used) for the duration of the COVID-19 declaration under Section 564(b)(1) of the Act, 21 U.S.C.section 360bbb-3(b)(1), unless the authorization is terminated  or revoked sooner.       Influenza A by PCR NEGATIVE NEGATIVE Final   Influenza B by PCR NEGATIVE NEGATIVE Final    Comment: (NOTE) The Xpert Xpress  SARS-CoV-2/FLU/RSV plus assay is intended as an aid in the diagnosis of influenza from Nasopharyngeal swab specimens and should not be used as a sole basis for treatment. Nasal washings and aspirates are unacceptable for Xpert Xpress SARS-CoV-2/FLU/RSV testing.  Fact Sheet for Patients: BloggerCourse.comhttps://www.fda.gov/media/152166/download  Fact Sheet for Healthcare Providers: SeriousBroker.ithttps://www.fda.gov/media/152162/download  This test is not yet approved or cleared by the [place] FDA and has been authorized for detection and/or diagnosis of SARS-CoV-2 by FDA under an Emergency Use Authorization (EUA). This EUA will remain in effect (meaning this test can be used) for the duration of the COVID-19 declaration under Section 564(b)(1) of the Act, 21 U.S.C. section 360bbb-3(b)(1), unless the authorization is terminated or revoked.  Performed at [facility], [address] [place], [place] [id number]  Radiology Studies: No results found.      Scheduled Meds:  acetaminophen  500 mg Oral Q6H   amLODipine  10 mg Oral Daily   feeding supplement  237 mL Oral BID BM   gabapentin  100 mg Oral BID   oxyCODONE  7.5 mg Oral Q6H   Continuous Infusions:   LOS: 5 days    Time spent: 15 minutes    Tresa Moore, MD [facility] Pager [phone]  If 7PM-7AM, please contact night-coverage 09/10/2020, 1:44 PM

## 2020-09-11 DIAGNOSIS — M8448XD Pathological fracture, other site, subsequent encounter for fracture with routine healing: Secondary | ICD-10-CM | POA: Diagnosis not present

## 2020-09-11 DIAGNOSIS — Z515 Encounter for palliative care: Secondary | ICD-10-CM | POA: Diagnosis not present

## 2020-09-11 NOTE — Plan of Care (Signed)
Patient confused and disoriented, requires frequent redirection. No respiratory distress during shift on room air. Vitals remain stable, intermittent moans of generalized pain especially to bilateral lower extremities, managed with scheduled pain medications. Bed alarm active and bed in lowest position. Stable condition will continue to monitor. Pending hospice referral. Problem: Education: Goal: Knowledge of General Education information will improve Description: Including pain rating scale, medication(s)/side effects and non-pharmacologic comfort measures Outcome: Progressing   Problem: Health Behavior/Discharge Planning: Goal: Ability to manage health-related needs will improve Outcome: Progressing   Problem: Clinical Measurements: Goal: Ability to maintain clinical measurements within normal limits will improve Outcome: Progressing Goal: Will remain free from infection Outcome: Progressing Goal: Diagnostic test results will improve Outcome: Progressing Goal: Respiratory complications will improve Outcome: Progressing Goal: Cardiovascular complication will be avoided Outcome: Progressing

## 2020-09-11 NOTE — Progress Notes (Signed)
PROGRESS NOTE    Olivia Bibleeggie J Bishop  WUJ:[id number] DOB: [birth year] DOA: 09/05/2020 PCP: Judy Pimpleower, Marne A, MD   Brief Narrative:  87 y.o. female with medical history significant for Dementia, HTN, osteopenia and frequent falls who presents to the ED for the second time in 24 hours with a complaint of inability to ambulate/bear weight on her left extremity following a fall at her skilled nursing facility on 7/24.  Patient was initially evaluated in the emergency room on 7/25 and had extensive imaging with CT thoracolumbar spine after she complained of low back pain following a fall.  She was admitted to observation but was signed out AGAINST MEDICAL ADVICE by her niece later on that evening as patient had voiced no additional complaint.  She was taken back to the [facility] facility however, it was noted that patient was still having difficulty weightbearing and there was still suspicion for acute injury from her recent fall so she was sent back to the ED.  Patient continued to have intermittent complaints of low back pain.  There were no reported complaints of difficulty with bladder or bowel control and she was able to move her legs independently once lying down but just unable to weight-bear.  I discussed the case with orthopedic surgery.  No surgical intervention warranted.  Weightbearing as tolerated especially during transfers.  Ensure pain control.  Patient will need placement.    7/28: Had an extensive and productive conversation with the patient's niece and MPOA Synetta Failnita.  Synetta Failnita relayed that the patient was independent up until 2 years ago when she had a devastating fall and resultant cervical spine fracture.  She was [facility] and because of her advanced age and extreme surgical risk no surgical invention was pursued and the patient ended up discharging to skilled nursing facility for therapy.  Since that time she has had multiple falls and overall decreased level of functioning both physical  and mental.  The patient's niece states recently she has noted the patient to lay in bed all day, be very apathetic, refused to eat.  I did relay that her brain imaging is significant for age-related volume loss and her changes in behavior was likely a manifestation of advanced dementia.  In my opinion patient is appropriate for palliative care and hospice services.  I have offered this to the patient's niece who is in agreement but needs to speak to her sister and the other MPOA.  In the meantime will consult palliative care and will have TOC reach out to hospice liaison.  TOC and forms of [facility] memory care unit will not be able to accept back the patient due to her needs.  TOC looking for inpatient hospice facility.  [facility] states they are unable to accept due to patient taking most of her meds by mouth. [facility] hospice representative states that hospice physician feels they are not appropriate for inpatient hospice because prognosis is not felt to be 2 weeks or less.  Per hospice representative patient is eligible for hospice at the skilled nursing facility.  Unclear disposition plan at this time   Assessment & Plan:   Principal Problem:   Bilateral sacral insufficiency fracture Active Problems:   Essential hypertension   Osteopenia   Vascular dementia (HCC)   Frequent falls   Ambulatory dysfunction   Malnutrition of moderate degree  Bilateral sacral insufficiency fracture with difficulty weightbearing s/p fall on 7/24 Osteopenia/frequent falls Patient appears comfortable on my examination.  CT abdomen pelvis shows acute/subacute right  sacral insufficiency fracture and possible left sacral insufficiency fracture.  Orthopedics has evaluated the films.  Recommendations as below. Plan: Continue multimodal pain control.  Scheduled oxycodone 7.5 mg every 6 hours.  As needed morphine, Flexeril, Neurontin.  Patient with overall poor prognosis.  Adult failure to thrive, poor p.o.  intake, intractable pain.  Unfortunately hospice physician felt that her prognosis was not 2 weeks or less so declined to accept to hospice home.  At this point patient may be eligible for hospice at a skilled nursing facility however this may incur out-of-pocket cost.  TOC to follow-up     Essential hypertension Improved control over interval Plan: If current plans are to proceed with hospice referral we will discontinue home Cozaar.  Continue amlodipine for now.  Ensure pain control.     Vascular dementia [facility]) Patient is awake, alert to person only Per the patient's niece baseline mentation is poor and has been deteriorating.  Brain imaging with significant volume loss.  Plan to discharge to inpatient hospice facility   DVT prophylaxis: SQ Lovenox Code Status: DNR Family Communication: [name] [phone] on 7/28  Disposition Plan: Status is: Inpatient  Remains inpatient appropriate because:Inpatient level of care appropriate due to severity of illness  Dispo: The patient is from: ALF              Anticipated d/c is to: Inpatient hospice facility              Patient currently is not medically stable to d/c.   Difficult to place patient No   Bilateral sacral insufficiency fractures.  Recent functional decline, adult failure to thrive.  Appropriate for inpatient hospice facility.  Unfortunately hospice physician disagreed as her prognosis is not felt to be 2 weeks or less.  TOC aware.  Best option may be to discharge to skilled nursing facility with hospice care.  Level of care: Med-Surg  Consultants:  Orthopedics Palliative care  Procedures: None  Antimicrobials:  None   Subjective: Seen and examined.  History limited by underlying dementia.  Resting in bed.  Intermittent pain however appears more comfortable after pain medication regimen adjusted. Objective: Vitals:   09/10/20 0839 09/10/20 2113 09/11/20 0440 09/11/20 0736  BP: (!) 151/98 133/86 (!) 138/92  133/84  Pulse: 92 75 77 65  Resp: 14 19 18 14   Temp: 98 F (36.7 C) 98.5 F (36.9 C) 98.7 F (37.1 C) 98.4 F (36.9 C)  TempSrc:      SpO2: 98% 97% 94% 97%  Weight:      Height:       No intake or output data in the 24 hours ending 09/11/20 1328  Filed Weights   09/05/20 1518 09/05/20 1523 09/06/20 1101  Weight: 54 kg 54 kg 47.7 kg    Examination:  General exam: No acute distress.  Frail and chronically ill Respiratory system: Bibasilar fine crackles.  Normal work of breathing.  Room air Cardiovascular system: S1-S2, regular rate and rhythm, no murmurs, no pedal edema  gastrointestinal system: Scaphoid, nontender, nondistended, normal bowel sounds Central nervous system: Awake, oriented x1, no focal deficits Extremities: Decreased power bilateral lower extremities, gait not assessed Skin: Thin and pale, various excoriations Psychiatry: Judgement and insight appear impaired. Mood & affect flattened.     Data Reviewed: I have personally reviewed following labs and imaging studies  CBC: Recent Labs  Lab 09/06/20 0609  WBC 7.3  NEUTROABS 4.5  HGB 11.7*  HCT 33.5*  MCV 97.4  PLT 154  Basic Metabolic Panel: Recent Labs  Lab 09/06/20 0609  NA 138  K 3.6  CL 103  CO2 27  GLUCOSE 118*  BUN 25*  CREATININE 0.70  CALCIUM 9.9   GFR: Estimated Creatinine Clearance: 36.6 mL/min (by C-G formula based on SCr of 0.7 mg/dL). Liver Function Tests: Recent Labs  Lab 09/06/20 0609  AST 23  ALT 24  ALKPHOS 114  BILITOT 0.7  PROT 6.2*  ALBUMIN 3.2*   No results for input(s): LIPASE, AMYLASE in the last 168 hours. No results for input(s): AMMONIA in the last 168 hours. Coagulation Profile: No results for input(s): INR, PROTIME in the last 168 hours. Cardiac Enzymes: No results for input(s): CKTOTAL, CKMB, CKMBINDEX, TROPONINI in the last 168 hours.  BNP (last 3 results) No results for input(s): PROBNP in the last 8760 hours. HbA1C: No results for input(s):  HGBA1C in the last 72 hours. CBG: No results for input(s): GLUCAP in the last 168 hours. Lipid Profile: No results for input(s): CHOL, HDL, LDLCALC, TRIG, CHOLHDL, LDLDIRECT in the last 72 hours. Thyroid Function Tests: No results for input(s): TSH, T4TOTAL, FREET4, T3FREE, THYROIDAB in the last 72 hours. Anemia Panel: No results for input(s): VITAMINB12, FOLATE, FERRITIN, TIBC, IRON, RETICCTPCT in the last 72 hours. Sepsis Labs: No results for input(s): PROCALCITON, LATICACIDVEN in the last 168 hours.  Recent Results (from the past 240 hour(s))  Resp Panel by RT-PCR (Flu A&B, Covid) Nasopharyngeal Swab     Status: None   Collection Time: 09/04/20  3:03 PM   Specimen: Nasopharyngeal Swab; Nasopharyngeal(NP) swabs in vial transport medium  Result Value Ref Range Status   SARS Coronavirus 2 by RT PCR NEGATIVE NEGATIVE Final    Comment: (NOTE) SARS-CoV-2 target nucleic acids are NOT DETECTED.  The SARS-CoV-2 RNA is generally detectable in upper respiratory specimens during the acute phase of infection. The lowest concentration of SARS-CoV-2 viral copies this assay can detect is 138 copies/mL. A negative result does not preclude SARS-Cov-2 infection and should not be used as the sole basis for treatment or other patient management decisions. A negative result may occur with  improper specimen collection/handling, submission of specimen other than nasopharyngeal swab, presence of viral mutation(s) within the areas targeted by this assay, and inadequate number of viral copies(<138 copies/mL). A negative result must be combined with clinical observations, patient history, and epidemiological information. The expected result is Negative.  Fact Sheet for Patients:  BloggerCourse.com  Fact Sheet for Healthcare Providers:  SeriousBroker.it  This test is no t yet approved or cleared by the [place] FDA and  has been authorized for  detection and/or diagnosis of SARS-CoV-2 by FDA under an Emergency Use Authorization (EUA). This EUA will remain  in effect (meaning this test can be used) for the duration of the COVID-19 declaration under Section 564(b)(1) of the Act, 21 U.S.C.section 360bbb-3(b)(1), unless the authorization is terminated  or revoked sooner.       Influenza A by PCR NEGATIVE NEGATIVE Final   Influenza B by PCR NEGATIVE NEGATIVE Final    Comment: (NOTE) The Xpert Xpress SARS-CoV-2/FLU/RSV plus assay is intended as an aid in the diagnosis of influenza from Nasopharyngeal swab specimens and should not be used as a sole basis for treatment. Nasal washings and aspirates are unacceptable for Xpert Xpress SARS-CoV-2/FLU/RSV testing.  Fact Sheet for Patients: BloggerCourse.com  Fact Sheet for Healthcare Providers: SeriousBroker.it  This test is not yet approved or cleared by the [place] and has been authorized for  detection and/or diagnosis of SARS-CoV-2 by FDA under an Emergency Use Authorization (EUA). This EUA will remain in effect (meaning this test can be used) for the duration of the COVID-19 declaration under Section 564(b)(1) of the Act, 21 U.S.C. section 360bbb-3(b)(1), unless the authorization is terminated or revoked.  Performed at [facility], [address], [place], [place] [address]          Radiology Studies: No results found.      Scheduled Meds:  acetaminophen  500 mg Oral Q6H   amLODipine  10 mg Oral Daily   feeding supplement  237 mL Oral BID BM   gabapentin  100 mg Oral BID   oxyCODONE  7.5 mg Oral Q6H   Continuous Infusions:   LOS: 6 days    Time spent: 15 minutes    Tresa Moore, MD [facility] Pager [phone]  If 7PM-7AM, please contact night-coverage 09/11/2020, 1:28 PM

## 2020-09-11 NOTE — TOC Progression Note (Signed)
Transition of Care [facility]) - Progression Note    Patient Details  Name: [name] MRN: [id number] Date of Birth: 11/21/1930  Transition of Care [facility]) CM/SW Contact  Barrie Dunker, RN Phone Number: 09/11/2020, 1:09 PM  Clinical Narrative:      Per [facility] physician, [name] is not appropriate for Hospice Home because she does not feel prognosis is 2 weeks or less. She is eligible forHospice at skilled nursing facility [facility] physician, [name] is not appropriate for Hospice Home because she does not feel prognosis is 2 weeks or less. She is eligible forHospice at skilled nursing facility       Expected Discharge Plan and Services                                                 Social Determinants of Health (SDOH) Interventions    Readmission Risk Interventions No flowsheet data found.

## 2020-09-11 NOTE — Progress Notes (Signed)
[facility] [employer], [employer] [facility]) Hospital Liaison RN Note  Initial referral received from Marice Potter, RN Transitions of Care [facility]) Manager for [facility]. Patient is not eligible for Hospice Home at this time, but is eligible for hospice services in skilled nursing facility.   Spoke with patient's niece, Tammy to notify above and to initiate education related to hospice philosophy, services and team approach to care. Family verbalized understanding of information given.   Hospital liaison will continue to follow through disposition.   Please do not hesitate to call with any hospice related questions.   Thank you for the opportunity to participate in this patient's care.  Bobbie "Einar Gip, RN, BSN [facility] Liaison [phone]

## 2020-09-12 DIAGNOSIS — M8448XD Pathological fracture, other site, subsequent encounter for fracture with routine healing: Secondary | ICD-10-CM | POA: Diagnosis not present

## 2020-09-12 MED ORDER — SENNOSIDES-DOCUSATE SODIUM 8.6-50 MG PO TABS: 1.0000 | ORAL_TABLET | Freq: Every evening | ORAL | Status: AC | PRN

## 2020-09-12 MED ORDER — POLYETHYLENE GLYCOL 3350 17 G PO PACK
17.0000 g | PACK | Freq: Every day | ORAL | Status: DC
  Administered 2020-09-12 – 2020-09-13 (×2): 17 g via ORAL
  Filled 2020-09-12 (×2): qty 1

## 2020-09-12 MED ORDER — SENNOSIDES-DOCUSATE SODIUM 8.6-50 MG PO TABS
1.0000 | ORAL_TABLET | Freq: Two times a day (BID) | ORAL | Status: DC
  Administered 2020-09-12 – 2020-09-13 (×2): 1 via ORAL
  Filled 2020-09-12 (×2): qty 1

## 2020-09-12 MED ORDER — GABAPENTIN 100 MG PO CAPS: 100.0000 mg | ORAL_CAPSULE | Freq: Two times a day (BID) | ORAL | Status: AC

## 2020-09-12 MED ORDER — OXYCODONE HCL 5 MG PO TABS: 5.0000 mg | ORAL_TABLET | ORAL | 0 refills | Status: AC | PRN

## 2020-09-12 MED ORDER — OXYCODONE HCL 7.5 MG PO TABS: 7.5000 mg | ORAL_TABLET | Freq: Four times a day (QID) | ORAL | 0 refills | Status: AC

## 2020-09-12 MED ORDER — BISACODYL 10 MG RE SUPP
10.0000 mg | Freq: Every day | RECTAL | Status: DC | PRN
  Administered 2020-09-13: 10 mg via RECTAL
  Filled 2020-09-12: qty 1

## 2020-09-12 NOTE — TOC Progression Note (Signed)
Transition of Care [facility]) - Progression Note    Patient Details  Name: [name] MRN: [id number] Date of Birth: 10-Jun-1930  Transition of Care [facility]) CM/SW Contact  Barrie Dunker, RN Phone Number: 09/12/2020, 4:37 PM  Clinical Narrative:     Sherron Monday with Sharee at [facility] [place], they are going to accept the patient as a patient in [place], [facility] will transport at 1130 AM, Notified The Dr and Chase Picket notified Tammy the niece       Expected Discharge Plan and Services                                                 Social Determinants of Health (SDOH) Interventions    Readmission Risk Interventions No flowsheet data found.

## 2020-09-12 NOTE — Plan of Care (Signed)

## 2020-09-12 NOTE — Discharge Summary (Signed)
Physician Discharge Summary  TIMEA BREED VQQ:[id number] DOB: [birth year] DOA: 09/05/2020  PCP: Judy Pimple, MD  Admit date: 09/05/2020 Discharge date: 09/13/2020  Admitted From: Home Disposition: Inpatient hospice   Recommendations for Outpatient Follow-up:  Per hospice providers  Home Health: No Equipment/Devices: Oxygen 2 L for patient comfort only  Discharge Condition: Hospice CODE STATUS: DNR Diet recommendation: Comfort feeds  Brief/Interim Summary: 87 y.o. female with medical history significant for Dementia, HTN, osteopenia and frequent falls who presents to the ED for the second time in 24 hours with a complaint of inability to ambulate/bear weight on her left extremity following a fall at her skilled nursing facility on 7/24.  Patient was initially evaluated in the emergency room on 7/25 and had extensive imaging with CT thoracolumbar spine after she complained of low back pain following a fall.  She was admitted to observation but was signed out AGAINST MEDICAL ADVICE by her niece later on that evening as patient had voiced no additional complaint.  She was taken back to the [facility] facility however, it was noted that patient was still having difficulty weightbearing and there was still suspicion for acute injury from her recent fall so she was sent back to the ED.  Patient continued to have intermittent complaints of low back pain.  There were no reported complaints of difficulty with bladder or bowel control and she was able to move her legs independently once lying down but just unable to weight-bear.   I discussed the case with orthopedic surgery.  No surgical intervention warranted.  Weightbearing as tolerated especially during transfers.  Ensure pain control.  Patient will need placement.     7/28: Had an extensive and productive conversation with the patient's niece and MPOA Synetta Fail.  Synetta Fail relayed that the patient was independent up until 2 years ago when she had a  devastating fall and resultant cervical spine fracture.  She was hospitalized at [facility] and because of her advanced age and extreme surgical risk no surgical invention was pursued and the patient ended up discharging to skilled nursing facility for therapy.  Since that time she has had multiple falls and overall decreased level of functioning both physical and mental.  The patient's niece states recently she has noted the patient to lay in bed all day, be very apathetic, refused to eat.  I did relay that her brain imaging is significant for age-related volume loss and her changes in behavior was likely a manifestation of advanced dementia.  In my opinion patient is appropriate for palliative care and hospice services.  I have offered this to the patient's niece who is in agreement but needs to speak to her sister and the other MPOA.  In the meantime will consult palliative care and will have TOC reach out to hospice liaison.   TOC and forms of [facility] unit will not be able to accept back the patient due to her needs.  TOC looking for inpatient hospice facility.  [facility] states they are unable to accept due to patient taking most of her meds by mouth. [facility] hospice representative states that hospice physician feels they are not appropriate for inpatient hospice because prognosis is not felt to be 2 weeks or less.  Per hospice representative patient is eligible for hospice at the skilled nursing facility.  TOC was able to secure a accepting hospice facility in [place].  Patient will discharge on 8/3 via virtual transport.   Discharge Diagnoses:  Principal Problem:  Bilateral sacral insufficiency fracture Active Problems:   Essential hypertension   Osteopenia   Vascular dementia (HCC)   Frequent falls   Ambulatory dysfunction   Malnutrition of moderate degree Bilateral sacral insufficiency fracture with difficulty weightbearing s/p fall on 7/24 Osteopenia/frequent  falls Patient appears comfortable on my examination.  CT abdomen pelvis shows acute/subacute right sacral insufficiency fracture and possible left sacral insufficiency fracture.  Orthopedics has evaluated the films.  Recommendations as below. Plan: Discharge to inpatient hospice facility.  Continue multimodal pain control.  Inpatient pain regimen has been reflected on discharge medication reconciliation.    Essential hypertension Improved control over interval Plan: Discontinue all antihypertensives at time of discharge to inpatient hospice facility     Vascular dementia [facility]) Patient is awake, alert to person only Per the patient's niece baseline mentation is poor and has been deteriorating.  Brain imaging with significant volume loss.  Plan to discharge to inpatient hospice facility   Discharge Instructions   Allergies as of 09/12/2020       Reactions   Celecoxib Other (See Comments)   affects kidneys   Codeine Nausea Only, Other (See Comments)   dizziness   Lisinopril Cough   Etodolac Rash        Medication List     STOP taking these medications    amLODipine 5 MG tablet Commonly known as: NORVASC   atorvastatin 40 MG tablet Commonly known as: Lipitor   loperamide 2 MG capsule Commonly known as: IMODIUM   losartan 100 MG tablet Commonly known as: COZAAR   Multivitamin Adult Tabs   sertraline 100 MG tablet Commonly known as: ZOLOFT   VITAMIN D PO       TAKE these medications    acetaminophen 325 MG tablet Commonly known as: TYLENOL Take 650 mg by mouth every 6 (six) hours as needed.   gabapentin 100 MG capsule Commonly known as: NEURONTIN Take 1 capsule (100 mg total) by mouth 2 (two) times daily.   oxyCODONE 5 MG immediate release tablet Commonly known as: Oxy IR/ROXICODONE Take 1 tablet (5 mg total) by mouth every 3 (three) hours as needed for moderate pain or severe pain.   oxyCODONE HCl 7.5 MG Tabs Take 7.5 mg by mouth every 6 (six)  hours.   senna-docusate 8.6-50 MG tablet Commonly known as: Senokot-S Take 1 tablet by mouth at bedtime as needed for mild constipation.        Allergies  Allergen Reactions   Celecoxib Other (See Comments)    affects kidneys   Codeine Nausea Only and Other (See Comments)    dizziness   Lisinopril Cough   Etodolac Rash    Consultations: Palliative care   Procedures/Studies: CT Head Wo Contrast  Result Date: 09/04/2020 CLINICAL DATA:  Unwitnessed fall EXAM: CT HEAD WITHOUT CONTRAST TECHNIQUE: Contiguous axial images were obtained from the base of the skull through the vertex without intravenous contrast. COMPARISON:  07/19/2020 FINDINGS: Brain: There is atrophy and chronic small vessel disease changes. No acute intracranial abnormality. Specifically, no hemorrhage, hydrocephalus, mass lesion, acute infarction, or significant intracranial injury. Vascular: No hyperdense vessel or unexpected calcification. Skull: No acute calvarial abnormality. Sinuses/Orbits: No acute findings Other: None IMPRESSION: Atrophy, chronic microvascular disease. No acute intracranial abnormality. Electronically Signed   By: Charlett Nose M.D.   On: 09/04/2020 09:39   CT Cervical Spine Wo Contrast  Result Date: 09/04/2020 CLINICAL DATA:  Fall EXAM: CT CERVICAL SPINE WITHOUT CONTRAST TECHNIQUE: Multidetector CT imaging of the cervical spine was  performed without intravenous contrast. Multiplanar CT image reconstructions were also generated. COMPARISON:  None. FINDINGS: Alignment: Continued 4 mm of anterolisthesis of C3 on C4 and C4 on C5 related to degenerative changes. Skull base and vertebrae: Again noted is the chronic type 2 odontoid fracture with nonunion. 2 mm of distraction and 3 mm of posterior displacement of the odontoid process relative to the C2 vertebral body. Fusion of the anterior arch of C1 with the odontoid. No acute fracture. Soft tissues and spinal canal: No prevertebral fluid or swelling. No  visible canal hematoma. Disc levels: Diffuse degenerative disc disease. Advanced bilateral degenerative facet disease. 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 Displacement and posterior subluxation of the odontoid process and C1 relative to C2. Appearance is stable since prior study. Advanced degenerative disc and facet disease. No acute bony abnormality. Electronically Signed   By: Charlett Nose M.D.   On: 09/04/2020 09:45   CT Thoracic Spine Wo Contrast  Result Date: 09/04/2020 CLINICAL DATA:  Status post fall. Patient found down. Initial encounter. EXAM: CT THORACIC SPINE WITHOUT CONTRAST TECHNIQUE: Multidetector CT images of the thoracic were obtained using the standard protocol without intravenous contrast. COMPARISON:  Plain films lumbar spine 04/05/2018. FINDINGS: Alignment: No listhesis. There is some exaggeration of the normal thoracic kyphosis. Vertebrae: No acute fracture or focal pathologic process. Remote T12 compression fracture was present on the prior exam. Bones appear osteopenic. Paraspinal and other soft tissues: Aortic atherosclerosis. Cardiomegaly moderate hiatal hernia. There is some dependent atelectasis. Disc levels: There is mild bony retropulsion off the superior endplate of T12 causing mild spinal stenosis. Vacuum disc phenomenon is seen at T12-L1. Intervertebral disc spaces are otherwise unremarkable IMPRESSION: No acute abnormality. Remote T12 compression fracture. Electronically Signed   By: Drusilla Kanner M.D.   On: 09/04/2020 12:12   CT Lumbar Spine Wo Contrast  Result Date: 09/04/2020 CLINICAL DATA:  Status post fall. Patient found down. Initial encounter. EXAM: CT LUMBAR SPINE WITHOUT CONTRAST TECHNIQUE: Multidetector CT imaging of the lumbar spine was performed without intravenous contrast administration. Multiplanar CT image reconstructions were also generated. COMPARISON:  Plain films lumbar spine 04/05/2018.  FINDINGS: Segmentation: Standard. Alignment: No listhesis. Severe convex right scoliosis with the apex at L3-4. Vertebrae: No acute fracture or focal pathologic process. Remote T12 compression fracture noted. Paraspinal and other soft tissues: Small calcifications in the kidneys may be vascular. No hydronephrosis. Aortic atherosclerosis. Disc levels: Loss of disc space height and vacuum disc phenomenon are seen at L1-2 and L2-3. Autologous fusion across the L3-4 and L4-5 disc interspaces is noted. IMPRESSION: No acute abnormality. Remote T12 compression fracture. Severe convex right scoliosis. Hiatal hernia. Aortic Atherosclerosis (ICD10-I70.0). Electronically Signed   By: Drusilla Kanner M.D.   On: 09/04/2020 12:17   CT PELVIS WO CONTRAST  Result Date: 09/05/2020 CLINICAL DATA:  Fall, back pain left hip pain, nonweightbearing EXAM: CT PELVIS WITHOUT CONTRAST TECHNIQUE: Multidetector CT imaging of the pelvis was performed following the standard protocol without intravenous contrast. COMPARISON:  Oblique radiograph 08/19/2019, CT L-spine 09/04/2020 FINDINGS: Urinary Tract: Imaged portion of the lower pole left kidney and visible segments of the ureters are grossly unremarkable. Urinary bladder is free of acute abnormality accounting for degree of distension. Catch catheter noted medially. Bowel: No conspicuous large or small bowel thickening or dilatation. Normal air-filled appendix in the right lower quadrant. No evidence of bowel obstruction. Vascular/Lymphatic: Atherosclerotic calcifications within the distal abdominal aorta and branch vessels. No aneurysm  or ectasia. No enlarged pelvic nodes lymph nodes. Reproductive: Prior hysterectomy. 1.4 cm cystic focus along the right pelvic sidewall, likely adnexal cyst. Other: Small amount of skin thickening and subcutaneous stranding superficial to the sacrum towards the superior gluteal cleft. Small amount of fluid within the umbilicus. No body wall hematoma.  Musculoskeletal: Diffuse bony demineralization may limit detection of subtle or nondisplaced injuries. There is an acute to subacute right sacral insufficiency fracture with cortical step-off of the sacral ala towards the right SI joint (3/54) additional questionable band of sclerosis and lucency through the left sacral ala towards the SI joint could reflect a left insufficiency fracture as well. No other acute traumatic osseous injury of the bony pelvis or proximal femora. Internal rotation of the left hip is noted. Background of discogenic and facet degenerative changes in the imaged lumbar spine. Pseudoarticulation between the L5 transverse processes and adjacent sacral ala, can be a pain generator if there are concordant clinical features Bertolotti syndrome. Levocurvature of the lumbar levels. Remote appearing lateral compression deformity of L4 with fusion of the L3-4 vertebral bodies on the left. Additional underlying SI joint and symphysis pubis arthrosis. Moderate bilateral hip osteoarthrosis as well. Muscle bulk is slightly diminished but without gross atrophy or other acute or worrisome muscular abnormality. IMPRESSION: 1. Acute to subacute right sacral insufficiency fracture. Questionable left sacral insufficiency fracture as well. 2. No other acute traumatic osseous injury of the bony pelvis. 3. Remote appearing deformity of the L4 vertebral level with bony fusion of the L3-4 left vertebral body. 4. Degenerative changes of the spine, hips and pelvis as above. 5. Minimal skin thickening superficial to the right sacrum towards the superior margin of the gluteal cleft. Correlate with visual inspection. 6. Prior hysterectomy. 1.4 cm cystic structure in the left adnexa. Given the size of this focus, no follow-up imaging is recommended. Note: This recommendation does not to patients with increased risk (genetic, family history, elevated tumor markers or other high-risk factors) of ovarian cancer. Reference:  JACR 2020 Feb; 17(2):248-254 7.  Aortic Atherosclerosis (ICD10-I70.0). Electronically Signed   By: Kreg Shropshire M.D.   On: 09/05/2020 20:37   DG Humerus Left  Result Date: 09/04/2020 CLINICAL DATA:  Left arm pain after fall today. EXAM: LEFT HUMERUS - 2+ VIEW COMPARISON:  None. FINDINGS: There is no evidence of fracture or other focal bone lesions. Soft tissues are unremarkable. IMPRESSION: Negative. Electronically Signed   By: Lupita Raider M.D.   On: 09/04/2020 12:51   DG Hip Unilat W or Wo Pelvis 2-3 Views Left  Result Date: 09/05/2020 CLINICAL DATA:  Bilateral hip pain after multiple falls. EXAM: DG HIP (WITH OR WITHOUT PELVIS) 2-3V LEFT COMPARISON:  April 05, 2018. FINDINGS: There is no evidence of hip fracture or dislocation. Mild osteophyte formation is noted. IMPRESSION: Mild degenerative joint disease of the left hip. No acute abnormality seen. Electronically Signed   By: Lupita Raider M.D.   On: 09/05/2020 18:42   DG Hip Unilat W or Wo Pelvis 2-3 Views Right  Result Date: 09/05/2020 CLINICAL DATA:  Bilateral hip pain after recent falls. EXAM: DG HIP (WITH OR WITHOUT PELVIS) 2-3V RIGHT COMPARISON:  None. FINDINGS: There is no evidence of hip fracture or dislocation. There is no evidence of arthropathy or other focal bone abnormality. IMPRESSION: Negative. Electronically Signed   By: Lupita Raider M.D.   On: 09/05/2020 18:43   (Echo, Carotid, EGD, Colonoscopy, ERCP)    Subjective: Seen and examined on day  of discharge.  Resting comfortably.  Pain well controlled  Discharge Exam: Vitals:   09/12/20 1153 09/12/20 1616  BP: 112/65 (!) 110/57  Pulse: (!) 57 65  Resp: 16 18  Temp: 98 F (36.7 C) 98.2 F (36.8 C)  SpO2: 97% 97%   Vitals:   09/12/20 0453 09/12/20 0816 09/12/20 1153 09/12/20 1616  BP: (!) 104/93 101/67 112/65 (!) 110/57  Pulse: 92 83 (!) 57 65  Resp: Temp: 97.8 F (36.6 C) 97.9 F (36.6 C) 98 F (36.7 C) 98.2 F (36.8 C)  TempSrc:    Oral   SpO2: 95% 97% 97% 97%  Weight:      Height:        General: Appears frail Cardiovascular: RRR, S1/S2 +, no rubs, no gallops Respiratory: CTA bilaterally, no wheezing, no rhonchi Abdominal: Soft, NT, ND, bowel sounds + Extremities: no edema, no cyanosis    The results of significant diagnostics from this hospitalization (including imaging, microbiology, ancillary and laboratory) are listed below for reference.     Microbiology: Recent Results (from the past 240 hour(s))  Resp Panel by RT-PCR (Flu A&B, Covid) Nasopharyngeal Swab     Status: None   Collection Time: 09/04/20  3:03 PM   Specimen: Nasopharyngeal Swab; Nasopharyngeal(NP) swabs in vial transport medium  Result Value Ref Range Status   SARS Coronavirus 2 by RT PCR NEGATIVE NEGATIVE Final    Comment: (NOTE) SARS-CoV-2 target nucleic acids are NOT DETECTED.  The SARS-CoV-2 RNA is generally detectable in upper respiratory specimens during the acute phase of infection. The lowest concentration of SARS-CoV-2 viral copies this assay can detect is 138 copies/mL. A negative result does not preclude SARS-Cov-2 infection and should not be used as the sole basis for treatment or other patient management decisions. A negative result may occur with  improper specimen collection/handling, submission of specimen other than nasopharyngeal swab, presence of viral mutation(s) within the areas targeted by this assay, and inadequate number of viral copies(<138 copies/mL). A negative result must be combined with clinical observations, patient history, and epidemiological information. The expected result is Negative.  Fact Sheet for Patients:  BloggerCourse.com  Fact Sheet for Healthcare Providers:  SeriousBroker.it  This test is no t yet approved or cleared by the [place] FDA and  has been authorized for detection and/or diagnosis of SARS-CoV-2 by FDA under an Emergency Use  Authorization (EUA). This EUA will remain  in effect (meaning this test can be used) for the duration of the COVID-19 declaration under Section 564(b)(1) of the Act, 21 U.S.C.section 360bbb-3(b)(1), unless the authorization is terminated  or revoked sooner.       Influenza A by PCR NEGATIVE NEGATIVE Final   Influenza B by PCR NEGATIVE NEGATIVE Final    Comment: (NOTE) The Xpert Xpress SARS-CoV-2/FLU/RSV plus assay is intended as an aid in the diagnosis of influenza from Nasopharyngeal swab specimens and should not be used as a sole basis for treatment. Nasal washings and aspirates are unacceptable for Xpert Xpress SARS-CoV-2/FLU/RSV testing.  Fact Sheet for Patients: BloggerCourse.com  Fact Sheet for Healthcare Providers: SeriousBroker.it  This test is not yet approved or cleared by the [place] FDA and has been authorized for detection and/or diagnosis of SARS-CoV-2 by FDA under an Emergency Use Authorization (EUA). This EUA will remain in effect (meaning this test can be used) for the duration of the COVID-19 declaration under Section 564(b)(1) of the Act, 21 U.S.C. section 360bbb-3(b)(1), unless the authorization is terminated  or revoked.  Performed at [facility], [address] [place], [place] [address]      Labs: BNP (last 3 results) No results for input(s): BNP in the last 8760 hours. Basic Metabolic Panel: Recent Labs  Lab 09/06/20 0609  NA 138  K 3.6  CL 103  CO2 27  GLUCOSE 118*  BUN 25*  CREATININE 0.70  CALCIUM 9.9   Liver Function Tests: Recent Labs  Lab 09/06/20 0609  AST 23  ALT 24  ALKPHOS 114  BILITOT 0.7  PROT 6.2*  ALBUMIN 3.2*   No results for input(s): LIPASE, AMYLASE in the last 168 hours. No results for input(s): AMMONIA in the last 168 hours. CBC: Recent Labs  Lab 09/06/20 0609  WBC 7.3  NEUTROABS 4.5  HGB 11.7*  HCT 33.5*  MCV 97.4  PLT 154   Cardiac  Enzymes: No results for input(s): CKTOTAL, CKMB, CKMBINDEX, TROPONINI in the last 168 hours. BNP: Invalid input(s): POCBNP CBG: No results for input(s): GLUCAP in the last 168 hours. D-Dimer No results for input(s): DDIMER in the last 72 hours. Hgb A1c No results for input(s): HGBA1C in the last 72 hours. Lipid Profile No results for input(s): CHOL, HDL, LDLCALC, TRIG, CHOLHDL, LDLDIRECT in the last 72 hours. Thyroid function studies No results for input(s): TSH, T4TOTAL, T3FREE, THYROIDAB in the last 72 hours.  Invalid input(s): FREET3 Anemia work up No results for input(s): VITAMINB12, FOLATE, FERRITIN, TIBC, IRON, RETICCTPCT in the last 72 hours. Urinalysis    Component Value Date/Time   COLORURINE YELLOW (A) 09/04/2020 1135   APPEARANCEUR HAZY (A) 09/04/2020 1135   LABSPEC 1.023 09/04/2020 1135   PHURINE 5.0 09/04/2020 1135   GLUCOSEU NEGATIVE 09/04/2020 1135   HGBUR NEGATIVE 09/04/2020 1135   HGBUR trace-intact 02/08/2008 1152   BILIRUBINUR NEGATIVE 09/04/2020 1135   BILIRUBINUR Negative 09/29/2015 1157   KETONESUR 20 (A) 09/04/2020 1135   PROTEINUR 30 (A) 09/04/2020 1135   UROBILINOGEN 0.2 09/29/2015 1157   UROBILINOGEN 0.2 11/13/2014 2040   NITRITE NEGATIVE 09/04/2020 1135   LEUKOCYTESUR NEGATIVE 09/04/2020 1135   Sepsis Labs Invalid input(s): PROCALCITONIN,  WBC,  LACTICIDVEN Microbiology Recent Results (from the past 240 hour(s))  Resp Panel by RT-PCR (Flu A&B, Covid) Nasopharyngeal Swab     Status: None   Collection Time: 09/04/20  3:03 PM   Specimen: Nasopharyngeal Swab; Nasopharyngeal(NP) swabs in vial transport medium  Result Value Ref Range Status   SARS Coronavirus 2 by RT PCR NEGATIVE NEGATIVE Final    Comment: (NOTE) SARS-CoV-2 target nucleic acids are NOT DETECTED.  The SARS-CoV-2 RNA is generally detectable in upper respiratory specimens during the acute phase of infection. The lowest concentration of SARS-CoV-2 viral copies this assay can detect  is 138 copies/mL. A negative result does not preclude SARS-Cov-2 infection and should not be used as the sole basis for treatment or other patient management decisions. A negative result may occur with  improper specimen collection/handling, submission of specimen other than nasopharyngeal swab, presence of viral mutation(s) within the areas targeted by this assay, and inadequate number of viral copies(<138 copies/mL). A negative result must be combined with clinical observations, patient history, and epidemiological information. The expected result is Negative.  Fact Sheet for Patients:  BloggerCourse.com  Fact Sheet for Healthcare Providers:  SeriousBroker.it  This test is no t yet approved or cleared by the [place] FDA and  has been authorized for detection and/or diagnosis of SARS-CoV-2 by FDA under an Emergency Use Authorization (EUA). This  EUA will remain  in effect (meaning this test can be used) for the duration of the COVID-19 declaration under Section 564(b)(1) of the Act, 21 U.S.C.section 360bbb-3(b)(1), unless the authorization is terminated  or revoked sooner.       Influenza A by PCR NEGATIVE NEGATIVE Final   Influenza B by PCR NEGATIVE NEGATIVE Final    Comment: (NOTE) The Xpert Xpress SARS-CoV-2/FLU/RSV plus assay is intended as an aid in the diagnosis of influenza from Nasopharyngeal swab specimens and should not be used as a sole basis for treatment. Nasal washings and aspirates are unacceptable for Xpert Xpress SARS-CoV-2/FLU/RSV testing.  Fact Sheet for Patients: BloggerCourse.comhttps://www.fda.gov/media/152166/download  Fact Sheet for Healthcare Providers: SeriousBroker.ithttps://www.fda.gov/media/152162/download  This test is not yet approved or cleared by the [place] FDA and has been authorized for detection and/or diagnosis of SARS-CoV-2 by FDA under an Emergency Use Authorization (EUA). This EUA will remain in effect  (meaning this test can be used) for the duration of the COVID-19 declaration under Section 564(b)(1) of the Act, 21 U.S.C. section 360bbb-3(b)(1), unless the authorization is terminated or revoked.  Performed at [facility], [address] [place], [place] [id number]      SIGNED:   Tresa MooreSudheer B Sreenath, MD  [facility] 09/12/2020, 4:51 PM Pager   If 7PM-7AM, please contact night-coverage

## 2020-09-12 NOTE — Progress Notes (Signed)
Nutrition Follow-up  DOCUMENTATION CODES:  Non-severe (moderate) malnutrition in context of social or environmental circumstances  INTERVENTION:  Continue current diet as ordered, encourage PO intake Continue Ensure Enlive po BID, each supplement provides 350 kcal and 20 grams of protein  NUTRITION DIAGNOSIS:  Moderate Malnutrition (in the context of social/environmental circumstances) related to  (inadequate oral intake) as evidenced by moderate fat depletion, mild fat depletion, moderate muscle depletion, severe muscle depletion.  GOAL:  Patient will meet greater than or equal to 90% of their needs  MONITOR:  PO intake, Supplement acceptance, Weight trends  REASON FOR ASSESSMENT:  Consult Hip fracture protocol  ASSESSMENT:  87 year old female with history of dementia, HTN, HLD, diverticulosis, Hx stroke, osteopenia and frequent falls presented to the ED for the second time in 24 hours with a complaint of inability to ambulate/bear weight on her left extremity following a fall at her skilled nursing facility on 7/24.   Pt was signed out AMA from ED the night prior to admission after initial imaging did not show acute fractures. Pt was sent back to ED by staff as pt was unable to bear weight, which is not her baseline. Further imaging upon return to the ED showed acute/subacute right sacral insufficiency fracture and possible left sacral insufficiency fracture.  Since last assessment, palliative care spoke with family about prognosis and GOC. Decided to transition to a more comfort based approach to care. No IVF, no antibiotics, and no feeding tube. CM working on finding facility where pt will dc with hospice.  Most medicines discontinued unless medically necessary. Noted no BM since 7/27. PRN bowel meds in place and were administered yesterday. Will follow-up with RN to determine if 1 dose was effective or if it should be repeated today.  Average Meal Intake: 7/28-8/2: 0% intake x 3  recorded meals  Nutritionally Relevant Medications: Scheduled Meds:  feeding supplement  237 mL Oral BID BM   PRN Meds: ondansetron, senna-docusate  Labs Reviewed  NUTRITION - FOCUSED PHYSICAL EXAM: Flowsheet Row Most Recent Value  Orbital Region Moderate depletion  Upper Arm Region Mild depletion  Thoracic and Lumbar Region Moderate depletion  Buccal Region Moderate depletion  Temple Region Mild depletion  Clavicle Bone Region Severe depletion  Clavicle and Acromion Bone Region Severe depletion  Scapular Bone Region Moderate depletion  Dorsal Hand Severe depletion  Patellar Region Moderate depletion  Anterior Thigh Region Moderate depletion  Posterior Calf Region Moderate depletion  Edema (RD Assessment) None  Hair Reviewed  Eyes Reviewed  Mouth Reviewed  Skin Reviewed  Nails Reviewed   Diet Order:   Diet Order             Diet regular Room service appropriate? Yes with Assist; Fluid consistency: Thin  Diet effective now                   EDUCATION NEEDS:  No education needs have been identified at this time  Skin:  Skin Assessment: Reviewed RN Assessment  Last BM:  7/27 - type 4  Height:  Ht Readings from Last 1 Encounters:  09/05/20 5\' 3"  (1.6 m)    Weight:  Wt Readings from Last 1 Encounters:  09/06/20 47.7 kg    Ideal Body Weight:  52.3 kg  BMI:  Body mass index is 18.62 kg/m.  Estimated Nutritional Needs:  Kcal:  1600-1800 kcal/d Protein:  80-90 g/d Fluid:  >1.8L/d   09/08/20, RD, LDN Clinical Dietitian Pager on Amion

## 2020-09-12 NOTE — TOC Progression Note (Addendum)
Transition of Care [facility]) - Progression Note    Patient Details  Name: [name] MRN: [id number] Date of Birth: Jul 07, 1930  Transition of Care [facility]) CM/SW Contact  Barrie Dunker, RN Phone Number: 09/12/2020, 2:50 PM  Clinical Narrative:     Called and spoke to [facility] and inquired if the patient was placed on IV meds would it make a difference, they will review her and let me know       Expected Discharge Plan and Services                                                 Social Determinants of Health (SDOH) Interventions    Readmission Risk Interventions No flowsheet data found.

## 2020-09-12 NOTE — Progress Notes (Signed)
PROGRESS NOTE    [name]  WGN:[id number] DOB: Apr 23, 1930 DOA: 09/05/2020 PCP: Judy Pimpleower, Marne A, MD   Brief Narrative:  [age] y.o. female with medical history significant for Dementia, HTN, osteopenia and frequent falls who presents to the ED for the second time in 24 hours with a complaint of inability to ambulate/bear weight on her left extremity following a fall at her skilled nursing facility on 7/24.  Patient was initially evaluated in the emergency room on 7/25 and had extensive imaging with CT thoracolumbar spine after she complained of low back pain following a fall.  She was admitted to observation but was signed out AGAINST MEDICAL ADVICE by her niece later on that evening as patient had voiced no additional complaint.  She was taken back to the [facility] facility however, it was noted that patient was still having difficulty weightbearing and there was still suspicion for acute injury from her recent fall so she was sent back to the ED.  Patient continued to have intermittent complaints of low back pain.  There were no reported complaints of difficulty with bladder or bowel control and she was able to move her legs independently once lying down but just unable to weight-bear.  I discussed the case with orthopedic surgery.  No surgical intervention warranted.  Weightbearing as tolerated especially during transfers.  Ensure pain control.  Patient will need placement.    7/28: Had an extensive and productive conversation with the patient's niece and MPOA Synetta Failnita.  Synetta Failnita relayed that the patient was independent up until 2 years ago when she had a devastating fall and resultant cervical spine fracture.  She was [facility] and because of her advanced age and extreme surgical risk no surgical invention was pursued and the patient ended up discharging to skilled nursing facility for therapy.  Since that time she has had multiple falls and overall decreased level of functioning both physical  and mental.  The patient's niece states recently she has noted the patient to lay in bed all day, be very apathetic, refused to eat.  I did relay that her brain imaging is significant for age-related volume loss and her changes in behavior was likely a manifestation of advanced dementia.  In my opinion patient is appropriate for palliative care and hospice services.  I have offered this to the patient's niece who is in agreement but needs to speak to her sister and the other MPOA.  In the meantime will consult palliative care and will have TOC reach out to hospice liaison.  TOC and forms of [facility] memory care unit will not be able to accept back the patient due to her needs.  TOC looking for inpatient hospice facility.  [facility] states they are unable to accept due to patient taking most of her meds by mouth. [facility] hospice representative states that hospice physician feels they are not appropriate for inpatient hospice because prognosis is not felt to be 2 weeks or less.  Per hospice representative patient is eligible for hospice at the skilled nursing facility.  Unclear disposition plan at this time.   Assessment & Plan:   Principal Problem:   Bilateral sacral insufficiency fracture Active Problems:   Essential hypertension   Osteopenia   Vascular dementia (HCC)   Frequent falls   Ambulatory dysfunction   Malnutrition of moderate degree  Bilateral sacral insufficiency fracture with difficulty weightbearing s/p fall on 7/24 Osteopenia/frequent falls Patient appears comfortable on my examination.  CT abdomen pelvis shows acute/subacute right  sacral insufficiency fracture and possible left sacral insufficiency fracture.  Orthopedics has evaluated the films.  Recommendations as below. Plan: Continue multimodal pain control.  Scheduled oxycodone 7.5 mg every 6 hours.  As needed morphine, Flexeril, Neurontin.  Patient with overall poor prognosis.  Adult failure to thrive, poor p.o.  intake, intractable pain.  Unfortunately hospice physician felt that her prognosis was not 2 weeks or less so declined to accept to hospice home.  At this point patient may be eligible for hospice at a skilled nursing facility however this may incur out-of-pocket cost.  TOC to follow-up.  TOC still looking for options     Essential hypertension Improved control over interval Plan: If current plans are to proceed with hospice referral we will discontinue home Cozaar.  Continue amlodipine for now.  Ensure pain control.     Vascular dementia [facility]) Patient is awake, alert to person only Per the patient's niece baseline mentation is poor and has been deteriorating.  Brain imaging with significant volume loss.  Plan to discharge to inpatient hospice facility   DVT prophylaxis: SQ Lovenox Code Status: DNR Family Communication: Rich Reining [phone] on 8/2  Disposition Plan: Status is: Inpatient  Remains inpatient appropriate because:Inpatient level of care appropriate due to severity of illness  Dispo: The patient is from: ALF              Anticipated d/c is to: Inpatient hospice facility versus SNF with hospice              Patient currently is not medically stable to d/c.   Difficult to place patient No   Bilateral sacral insufficiency fractures.  Recent functional decline, adult failure to thrive.  Appropriate for inpatient hospice facility.  Unfortunately hospice physician disagreed as her prognosis is not felt to be 2 weeks or less.  Patient prognosis is only 2 weeks or more because she is in a monitored setting where we make sure she eats.  If she is left to her own devices she likely will not eat and thus has a <2wk prognosis.  Difficult situation   Level of care: Med-Surg  Consultants:  Orthopedics Palliative care  Procedures: None  Antimicrobials:  None   Subjective: Seen and examined.  History limited by underlying dementia.  Resting in bed.  Intermittent pain  however appears more comfortable after pain medication regimen adjusted. Objective: Vitals:   09/11/20 2045 09/12/20 0453 09/12/20 0816 09/12/20 1153  BP: 109/61 (!) 104/93 101/67 112/65  Pulse: 78 92 83 (!) 57  Resp: 16 16 16 16   Temp: 97.8 F (36.6 C) 97.8 F (36.6 C) 97.9 F (36.6 C) 98 F (36.7 C)  TempSrc:      SpO2: 99% 95% 97% 97%  Weight:      Height:       No intake or output data in the 24 hours ending 09/12/20 1342  Filed Weights   09/05/20 1518 09/05/20 1523 09/06/20 1101  Weight: 54 kg 54 kg 47.7 kg    Examination:  General exam: No acute distress.  Frail and chronically ill Respiratory system: Bibasilar fine crackles.  Normal work of breathing.  Room air Cardiovascular system: S1-S2, regular rate and rhythm, no murmurs, no pedal edema  gastrointestinal system: Scaphoid, nontender, nondistended, normal bowel sounds Central nervous system: Awake, oriented x1, no focal deficits Extremities: Decreased power bilateral lower extremities, gait not assessed Skin: Thin and pale, various excoriations Psychiatry: Judgement and insight appear impaired. Mood & affect flattened.  Data Reviewed: I have personally reviewed following labs and imaging studies  CBC: Recent Labs  Lab 09/06/20 0609  WBC 7.3  NEUTROABS 4.5  HGB 11.7*  HCT 33.5*  MCV 97.4  PLT 154   Basic Metabolic Panel: Recent Labs  Lab 09/06/20 0609  NA 138  K 3.6  CL 103  CO2 27  GLUCOSE 118*  BUN 25*  CREATININE 0.70  CALCIUM 9.9   GFR: Estimated Creatinine Clearance: 36.6 mL/min (by C-G formula based on SCr of 0.7 mg/dL). Liver Function Tests: Recent Labs  Lab 09/06/20 0609  AST 23  ALT 24  ALKPHOS 114  BILITOT 0.7  PROT 6.2*  ALBUMIN 3.2*   No results for input(s): LIPASE, AMYLASE in the last 168 hours. No results for input(s): AMMONIA in the last 168 hours. Coagulation Profile: No results for input(s): INR, PROTIME in the last 168 hours. Cardiac Enzymes: No results  for input(s): CKTOTAL, CKMB, CKMBINDEX, TROPONINI in the last 168 hours.  BNP (last 3 results) No results for input(s): PROBNP in the last 8760 hours. HbA1C: No results for input(s): HGBA1C in the last 72 hours. CBG: No results for input(s): GLUCAP in the last 168 hours. Lipid Profile: No results for input(s): CHOL, HDL, LDLCALC, TRIG, CHOLHDL, LDLDIRECT in the last 72 hours. Thyroid Function Tests: No results for input(s): TSH, T4TOTAL, FREET4, T3FREE, THYROIDAB in the last 72 hours. Anemia Panel: No results for input(s): VITAMINB12, FOLATE, FERRITIN, TIBC, IRON, RETICCTPCT in the last 72 hours. Sepsis Labs: No results for input(s): PROCALCITON, LATICACIDVEN in the last 168 hours.  Recent Results (from the past 240 hour(s))  Resp Panel by RT-PCR (Flu A&B, Covid) Nasopharyngeal Swab     Status: None   Collection Time: 09/04/20  3:03 PM   Specimen: Nasopharyngeal Swab; Nasopharyngeal(NP) swabs in vial transport medium  Result Value Ref Range Status   SARS Coronavirus 2 by RT PCR NEGATIVE NEGATIVE Final    Comment: (NOTE) SARS-CoV-2 target nucleic acids are NOT DETECTED.  The SARS-CoV-2 RNA is generally detectable in upper respiratory specimens during the acute phase of infection. The lowest concentration of SARS-CoV-2 viral copies this assay can detect is 138 copies/mL. A negative result does not preclude SARS-Cov-2 infection and should not be used as the sole basis for treatment or other patient management decisions. A negative result may occur with  improper specimen collection/handling, submission of specimen other than nasopharyngeal swab, presence of viral mutation(s) within the areas targeted by this assay, and inadequate number of viral copies(<138 copies/mL). A negative result must be combined with clinical observations, patient history, and epidemiological information. The expected result is Negative.  Fact Sheet for Patients:   BloggerCourse.com  Fact Sheet for Healthcare Providers:  SeriousBroker.it  This test is no t yet approved or cleared by the [place] FDA and  has been authorized for detection and/or diagnosis of SARS-CoV-2 by FDA under an Emergency Use Authorization (EUA). This EUA will remain  in effect (meaning this test can be used) for the duration of the COVID-19 declaration under Section 564(b)(1) of the Act, 21 U.S.C.section 360bbb-3(b)(1), unless the authorization is terminated  or revoked sooner.       Influenza A by PCR NEGATIVE NEGATIVE Final   Influenza B by PCR NEGATIVE NEGATIVE Final    Comment: (NOTE) The Xpert Xpress SARS-CoV-2/FLU/RSV plus assay is intended as an aid in the diagnosis of influenza from Nasopharyngeal swab specimens and should not be used as a sole basis for treatment. Nasal washings and aspirates  are unacceptable for Xpert Xpress SARS-CoV-2/FLU/RSV testing.  Fact Sheet for Patients: BloggerCourse.com  Fact Sheet for Healthcare Providers: SeriousBroker.it  This test is not yet approved or cleared by the [place] FDA and has been authorized for detection and/or diagnosis of SARS-CoV-2 by FDA under an Emergency Use Authorization (EUA). This EUA will remain in effect (meaning this test can be used) for the duration of the COVID-19 declaration under Section 564(b)(1) of the Act, 21 U.S.C. section 360bbb-3(b)(1), unless the authorization is terminated or revoked.  Performed at [facility], [address], [place], [place] [address]          Radiology Studies: No results found.      Scheduled Meds:  acetaminophen  500 mg Oral Q6H   amLODipine  10 mg Oral Daily   feeding supplement  237 mL Oral BID BM   gabapentin  100 mg Oral BID   oxyCODONE  7.5 mg Oral Q6H   Continuous Infusions:   LOS: 7 days    Time spent: 15  minutes    Tresa Moore, MD [facility] Pager [phone]  If 7PM-7AM, please contact night-coverage 09/12/2020, 1:42 PM

## 2020-09-12 NOTE — Progress Notes (Addendum)
Daily Progress Note   Patient Name: [name]       Date: 09/12/2020 DOB: Oct 18, 1930  Age: 87 y.o. MRN#: [id number] Attending Physician: Tresa Moore, MD Primary Care Physician: Tower, Audrie Gallus, MD Admit Date: 09/05/2020  Reason for Consultation/Follow-up: Terminal Care  Subjective: Patient is resting in bed with eyes closed. Bites of her lunch tray still at bedside are missing. Per attending team her pain is better controlled. Plans in place for D/C to facility with hospice to follow there. Will need scheduled medications on D/C for pain management.   Length of Stay: 7  Current Medications: Scheduled Meds:   acetaminophen  500 mg Oral Q6H   amLODipine  10 mg Oral Daily   feeding supplement  237 mL Oral BID BM   gabapentin  100 mg Oral BID   oxyCODONE  7.5 mg Oral Q6H    Continuous Infusions:   PRN Meds: acetaminophen **OR** acetaminophen, cyclobenzaprine, morphine injection, ondansetron **OR** ondansetron (ZOFRAN) IV, oxyCODONE, senna-docusate  Physical Exam Constitutional:      Comments: Eyes closed. Resting            Vital Signs: BP 101/67 (BP Location: Left Arm)   Pulse 83   Temp 97.9 F (36.6 C)   Resp 16   Ht 5\' 3"  (1.6 m)   Wt 47.7 kg Comment: bed weight  SpO2 97%   BMI 18.62 kg/m  SpO2: SpO2: 97 % O2 Device: O2 Device: Room Air O2 Flow Rate:    Intake/output summary: No intake or output data in the 24 hours ending 09/12/20 0913 LBM: Last BM Date: 11/07/20 Baseline Weight: Weight: 54 kg Most recent weight: Weight: 47.7 kg (bed weight)         Patient Active Problem List   Diagnosis Date Noted   Malnutrition of moderate degree 09/06/2020   Bilateral sacral insufficiency fracture 09/05/2020   Ambulatory dysfunction 09/05/2020   Fall  09/04/2020   Frequent falls 09/04/2020   Chronic pain    Screening-pulmonary TB 05/20/2019   Cervical spine fracture (HCC) 12/18/2017   Weight loss 12/18/2017   Vascular dementia (HCC) 12/18/2017   Hearing loss 01/09/2016   Routine general medical examination at a health care facility 12/31/2015   Dysuria 09/29/2015   Anxiety and depression 09/21/2014   Pedal edema 06/22/2014  Encounter for Medicare annual wellness exam 03/18/2014   Retinal vein occlusion 03/18/2014   B12 deficiency 03/15/2014   Colon cancer screening 04/19/2013   At risk for falling 12/02/2011   Hyperparathyroidism (HCC) 11/20/2011   Back pain, thoracic 09/30/2011   Scoliosis 09/30/2011   Degenerative disc disease, lumbar 09/30/2011   Renal insufficiency 05/27/2011   Syncope 12/05/2010   Cerebral artery occlusion with cerebral infarction (HCC) 02/26/2008   Hyperlipidemia 05/12/2006   Essential hypertension 05/12/2006   DIVERTICULOSIS, COLON 05/12/2006   Osteopenia 05/12/2006    Palliative Care Assessment & Plan     Recommendations/Plan: Eating bites and sips. Prognosis is two weeks or less.  Will need scheduled comfort medications on discharge. There is concern for her symptom management as she has had significant functional pain that has limited her movement, and that she has to move to assess.     Code Status:    Code Status Orders  (From admission, onward)           Start     Ordered   09/05/20 2124  Do not attempt resuscitation (DNR)  Continuous       Question Answer Comment  In the event of cardiac or respiratory ARREST Do not call a "code blue"   In the event of cardiac or respiratory ARREST Do not perform Intubation, CPR, defibrillation or ACLS   In the event of cardiac or respiratory ARREST Use medication by any route, position, wound care, and other measures to relive pain and suffering. May use oxygen, suction and manual treatment of airway obstruction as needed for comfort.    Comments Code status was discussed with patient's niece Satira Mccallum over the phone and patient is a DNR.      09/05/20 2126           Code Status History     Date Active Date Inactive Code Status Order ID Comments User Context   09/04/2020 1537 09/05/2020 0248 DNR [id number]  Lucile Shutters, MD ED   09/04/2020 1518 09/04/2020 1537 DNR [id number]  Lucile Shutters, MD ED   11/17/2011 0147 11/21/2011 1714 Full Code [id number]  Ron Parker, MD ED      Advance Directive Documentation    Flowsheet Row Most Recent Value  Type of Advance Directive Healthcare Power of Attorney  Pre-existing out of facility DNR order (yellow form or pink MOST form) --  "MOST" Form in Place? --       Prognosis:  < 2 weeks    Care plan was discussed with TOC, attending, hospice liaison.   Thank you for allowing the Palliative Medicine Team to assist in the care of this patient.       Total Time 25 min Prolonged Time Billed  no       Greater than 50%  of this time was spent counseling and coordinating care related to the above assessment and plan.  Morton Stall, NP  Please contact Palliative Medicine Team phone at [phone] for questions and concerns.

## 2020-09-12 NOTE — Progress Notes (Signed)
Vitals entered manually ° °

## 2020-09-13 DIAGNOSIS — M8448XD Pathological fracture, other site, subsequent encounter for fracture with routine healing: Secondary | ICD-10-CM | POA: Diagnosis not present

## 2020-09-13 NOTE — Care Management Important Message (Signed)
Important Message  Patient Details  Name: [name] MRN: [id number] Date of Birth: 1930/06/28   Medicare Important Message Given:  Other (see comment)  Patient is on comfort feeds and to be discharged to a inpatient Hospice facility. Out of respect for the patient and family no Important Message from [facility] given.  Olegario Messier A Denny Mccree 09/13/2020, 8:35 AM

## 2020-09-13 NOTE — Progress Notes (Signed)
Patient picked up by EMS to be transferred to [facility]. Belongings sent with patient.

## 2020-09-13 NOTE — Progress Notes (Signed)
Report called to nurse Arline Asp at [facility] with opportunity to ask questions. Keep IV in per nursing staff at facility. EMS Transport to pick patient up at 1130.

## 2020-09-14 ENCOUNTER — Telehealth: Payer: Self-pay

## 2020-10-12 NOTE — Telephone Encounter (Signed)
Vm from [employer], Child psychotherapist at [facility] in [place].  Calling to inform Dr. Milinda Antis that pt passed away today, TOD 5:37 AM.  Feel free to call [employer] with any questions.

## 2020-10-12 NOTE — Telephone Encounter (Signed)
Status updated and routed to PCP

## 2020-10-12 NOTE — Telephone Encounter (Signed)
Aware, thanks for letting me know  

## 2020-10-12 DEATH — deceased

## 2021-12-30 IMAGING — CT CT HEAD W/O CM
2 of 9 series · 5 of 33 positions shown, 6 images · non-contrast
Comparison: 07/19/2020

CLINICAL DATA: Unwitnessed fall

EXAM:
CT HEAD WITHOUT CONTRAST
TECHNIQUE: Contiguous axial images were obtained from the base of the skull
through the vertex without intravenous contrast.

[Series 3: ax head bone · axial · 0.33mm/px · z∈[-109,-36]mm · 3 of 75 slices shown, 4 images]
[im 19/75  soft-tissue]
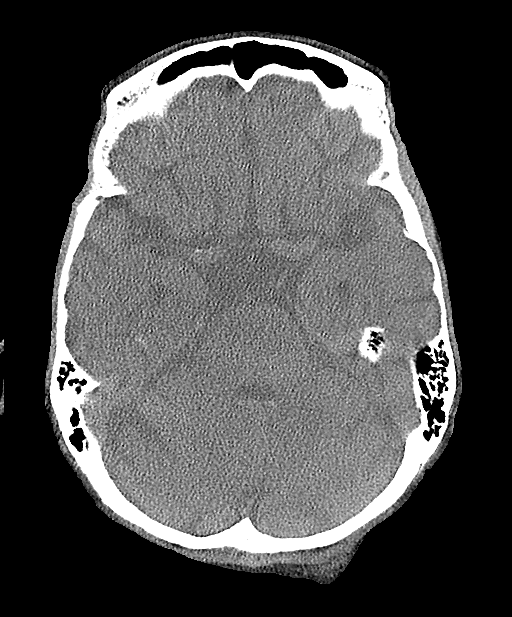
[im 19/75  bone]
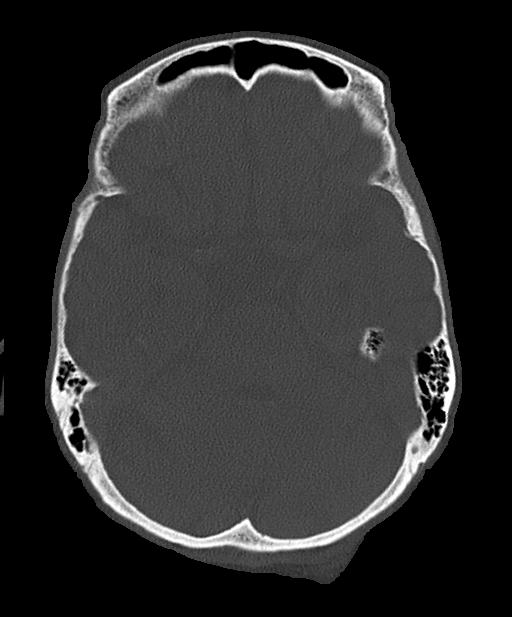
[im 38/75  soft-tissue]
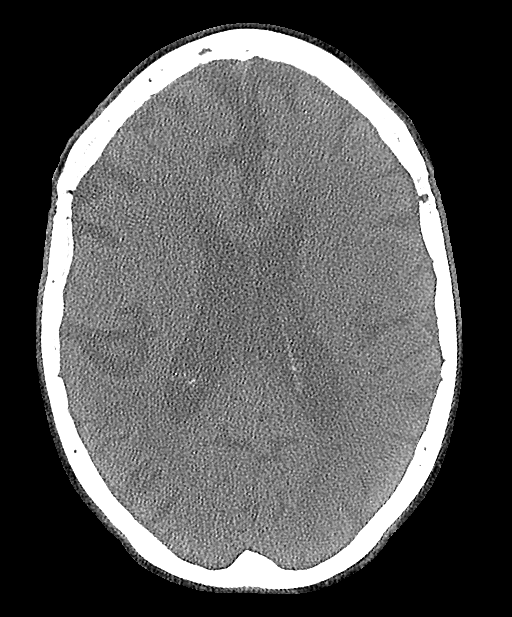
[im 56/75  soft-tissue]
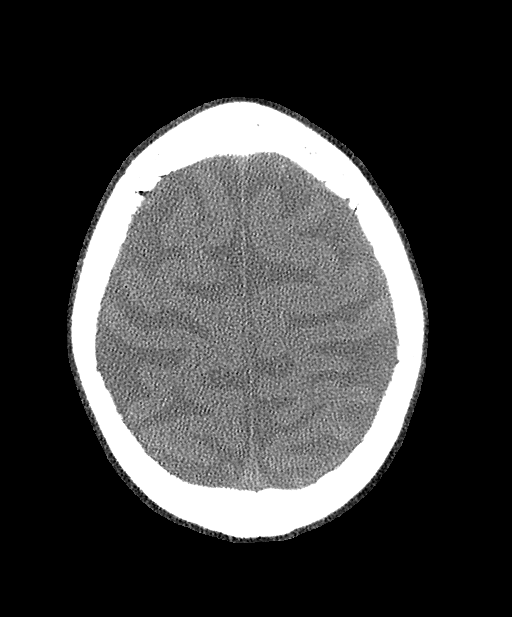

[Series 8: c spine soft · axial · 0.30mm/px · z∈[-241,-201]mm · 2 of 61 slices shown]
[im 21/61  soft-tissue]
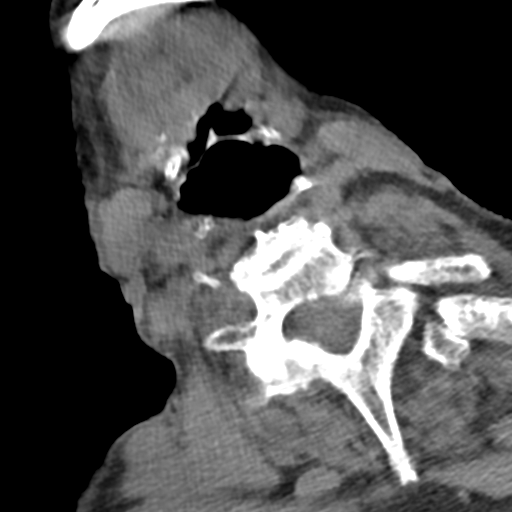
[im 41/61  soft-tissue]
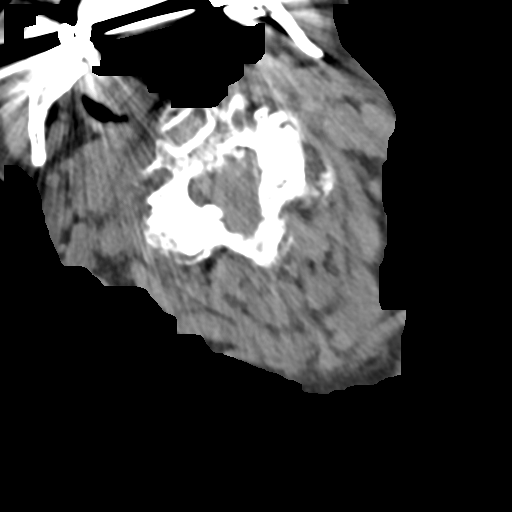

[5 of 33 positions shown; findings below may reference images not displayed]

FINDINGS: Brain: There is atrophy and chronic small vessel disease changes. No
acute intracranial abnormality. Specifically, no hemorrhage,
hydrocephalus, mass lesion, acute infarction, or significant
intracranial injury.

Vascular: No hyperdense vessel or unexpected calcification.

Skull: No acute calvarial abnormality.

Sinuses/Orbits: No acute findings

Other: None
IMPRESSION: Atrophy, chronic microvascular disease.

No acute intracranial abnormality.
